# Patient Record
Sex: Male | Born: 1940 | Race: Black or African American | Hispanic: No | Marital: Single | Smoking: Former smoker
Health system: Southern US, Community
[De-identification: ages and names within clinical notes are randomized; demographics above are authoritative.]

## PROBLEM LIST (undated history)

## (undated) DIAGNOSIS — I10 Essential (primary) hypertension: Secondary | ICD-10-CM

## (undated) DIAGNOSIS — R110 Nausea: Secondary | ICD-10-CM

## (undated) DIAGNOSIS — C3490 Malignant neoplasm of unspecified part of unspecified bronchus or lung: Secondary | ICD-10-CM

## (undated) DIAGNOSIS — K5792 Diverticulitis of intestine, part unspecified, without perforation or abscess without bleeding: Secondary | ICD-10-CM

## (undated) DIAGNOSIS — R918 Other nonspecific abnormal finding of lung field: Secondary | ICD-10-CM

## (undated) DIAGNOSIS — K219 Gastro-esophageal reflux disease without esophagitis: Secondary | ICD-10-CM

## (undated) DIAGNOSIS — C7931 Secondary malignant neoplasm of brain: Secondary | ICD-10-CM

## (undated) DIAGNOSIS — Z860101 Personal history of adenomatous and serrated colon polyps: Secondary | ICD-10-CM

## (undated) DIAGNOSIS — C719 Malignant neoplasm of brain, unspecified: Secondary | ICD-10-CM

## (undated) DIAGNOSIS — Z66 Do not resuscitate: Secondary | ICD-10-CM

## (undated) DIAGNOSIS — R937 Abnormal findings on diagnostic imaging of other parts of musculoskeletal system: Principal | ICD-10-CM

## (undated) DIAGNOSIS — N400 Enlarged prostate without lower urinary tract symptoms: Secondary | ICD-10-CM

## (undated) DIAGNOSIS — Z86010 Personal history of colonic polyps: Secondary | ICD-10-CM

## (undated) DIAGNOSIS — H538 Other visual disturbances: Secondary | ICD-10-CM

## (undated) HISTORY — DX: Personal history of adenomatous and serrated colon polyps: Z86.0101

## (undated) HISTORY — DX: Secondary malignant neoplasm of brain: C79.31

## (undated) HISTORY — DX: Other nonspecific abnormal finding of lung field: R91.8

## (undated) HISTORY — DX: Nausea: R11.0

## (undated) HISTORY — DX: Essential (primary) hypertension: I10

## (undated) HISTORY — DX: Do not resuscitate: Z66

## (undated) HISTORY — DX: Abnormal findings on diagnostic imaging of other parts of musculoskeletal system: R93.7

## (undated) HISTORY — PX: OTHER SURGICAL HISTORY: SHX169

## (undated) HISTORY — DX: Benign prostatic hyperplasia without lower urinary tract symptoms: N40.0

## (undated) HISTORY — DX: Personal history of colonic polyps: Z86.010

## (undated) HISTORY — DX: Gastro-esophageal reflux disease without esophagitis: K21.9

## (undated) HISTORY — PX: TONSILECTOMY, ADENOIDECTOMY, BILATERAL MYRINGOTOMY AND TUBES: SHX2538

## (undated) SURGERY — BRONCHOSCOPY, WITH EBUS
Anesthesia: Monitor Anesthesia Care

---

## 1997-10-25 ENCOUNTER — Ambulatory Visit (HOSPITAL_COMMUNITY): Admission: RE | Admit: 1997-10-25 | Discharge: 1997-10-25 | Payer: Self-pay | Admitting: Urology

## 1997-10-25 ENCOUNTER — Encounter: Payer: Self-pay | Admitting: Urology

## 2000-12-15 ENCOUNTER — Encounter: Payer: Self-pay | Admitting: Internal Medicine

## 2000-12-15 ENCOUNTER — Emergency Department (HOSPITAL_COMMUNITY): Admission: EM | Admit: 2000-12-15 | Discharge: 2000-12-15 | Payer: Self-pay | Admitting: Internal Medicine

## 2001-08-03 ENCOUNTER — Encounter: Payer: Self-pay | Admitting: Internal Medicine

## 2001-08-03 ENCOUNTER — Ambulatory Visit (HOSPITAL_COMMUNITY): Admission: RE | Admit: 2001-08-03 | Discharge: 2001-08-03 | Payer: Self-pay | Admitting: Internal Medicine

## 2001-08-15 ENCOUNTER — Ambulatory Visit (HOSPITAL_COMMUNITY): Admission: RE | Admit: 2001-08-15 | Discharge: 2001-08-15 | Payer: Self-pay | Admitting: Internal Medicine

## 2001-11-29 ENCOUNTER — Emergency Department (HOSPITAL_COMMUNITY): Admission: EM | Admit: 2001-11-29 | Discharge: 2001-11-29 | Payer: Self-pay | Admitting: Emergency Medicine

## 2002-04-14 ENCOUNTER — Emergency Department (HOSPITAL_COMMUNITY): Admission: EM | Admit: 2002-04-14 | Discharge: 2002-04-15 | Payer: Self-pay | Admitting: *Deleted

## 2002-04-15 ENCOUNTER — Encounter: Payer: Self-pay | Admitting: *Deleted

## 2003-02-26 ENCOUNTER — Ambulatory Visit (HOSPITAL_COMMUNITY): Admission: RE | Admit: 2003-02-26 | Discharge: 2003-02-26 | Payer: Self-pay | Admitting: Internal Medicine

## 2003-08-22 ENCOUNTER — Ambulatory Visit (HOSPITAL_COMMUNITY): Admission: RE | Admit: 2003-08-22 | Discharge: 2003-08-22 | Payer: Self-pay | Admitting: Internal Medicine

## 2005-07-05 ENCOUNTER — Ambulatory Visit (HOSPITAL_COMMUNITY): Admission: RE | Admit: 2005-07-05 | Discharge: 2005-07-05 | Payer: Self-pay | Admitting: Internal Medicine

## 2007-02-23 HISTORY — PX: CHOLECYSTECTOMY: SHX55

## 2007-05-23 ENCOUNTER — Ambulatory Visit: Payer: Self-pay | Admitting: Internal Medicine

## 2007-05-26 ENCOUNTER — Ambulatory Visit (HOSPITAL_COMMUNITY): Admission: RE | Admit: 2007-05-26 | Discharge: 2007-05-26 | Payer: Self-pay | Admitting: Internal Medicine

## 2007-06-06 ENCOUNTER — Ambulatory Visit (HOSPITAL_COMMUNITY): Admission: RE | Admit: 2007-06-06 | Discharge: 2007-06-06 | Payer: Self-pay | Admitting: Internal Medicine

## 2007-06-06 ENCOUNTER — Encounter: Payer: Self-pay | Admitting: Internal Medicine

## 2007-06-06 ENCOUNTER — Ambulatory Visit: Payer: Self-pay | Admitting: Internal Medicine

## 2007-07-03 ENCOUNTER — Ambulatory Visit: Payer: Self-pay | Admitting: Internal Medicine

## 2007-07-06 ENCOUNTER — Ambulatory Visit (HOSPITAL_COMMUNITY): Admission: RE | Admit: 2007-07-06 | Discharge: 2007-07-06 | Payer: Self-pay | Admitting: Internal Medicine

## 2007-07-14 ENCOUNTER — Ambulatory Visit (HOSPITAL_COMMUNITY): Admission: RE | Admit: 2007-07-14 | Discharge: 2007-07-14 | Payer: Self-pay | Admitting: Internal Medicine

## 2007-09-05 ENCOUNTER — Ambulatory Visit: Payer: Self-pay | Admitting: Internal Medicine

## 2007-09-12 ENCOUNTER — Ambulatory Visit: Payer: Self-pay | Admitting: Gastroenterology

## 2007-09-12 ENCOUNTER — Encounter: Payer: Self-pay | Admitting: Gastroenterology

## 2007-09-12 ENCOUNTER — Ambulatory Visit (HOSPITAL_COMMUNITY): Admission: RE | Admit: 2007-09-12 | Discharge: 2007-09-12 | Payer: Self-pay | Admitting: Gastroenterology

## 2007-10-10 ENCOUNTER — Ambulatory Visit: Payer: Self-pay | Admitting: Internal Medicine

## 2007-10-16 ENCOUNTER — Emergency Department (HOSPITAL_COMMUNITY): Admission: EM | Admit: 2007-10-16 | Discharge: 2007-10-16 | Payer: Self-pay | Admitting: Emergency Medicine

## 2007-10-17 ENCOUNTER — Encounter (HOSPITAL_COMMUNITY): Admission: RE | Admit: 2007-10-17 | Discharge: 2007-11-16 | Payer: Self-pay | Admitting: Internal Medicine

## 2007-10-25 ENCOUNTER — Observation Stay (HOSPITAL_COMMUNITY): Admission: RE | Admit: 2007-10-25 | Discharge: 2007-10-26 | Payer: Self-pay | Admitting: General Surgery

## 2007-10-25 ENCOUNTER — Encounter (INDEPENDENT_AMBULATORY_CARE_PROVIDER_SITE_OTHER): Payer: Self-pay | Admitting: General Surgery

## 2008-10-31 DIAGNOSIS — K219 Gastro-esophageal reflux disease without esophagitis: Secondary | ICD-10-CM | POA: Insufficient documentation

## 2008-10-31 DIAGNOSIS — F411 Generalized anxiety disorder: Secondary | ICD-10-CM

## 2008-10-31 DIAGNOSIS — I10 Essential (primary) hypertension: Secondary | ICD-10-CM | POA: Insufficient documentation

## 2008-11-05 ENCOUNTER — Ambulatory Visit: Payer: Self-pay | Admitting: Internal Medicine

## 2008-11-06 DIAGNOSIS — Z860100 Personal history of colon polyps, unspecified: Secondary | ICD-10-CM | POA: Insufficient documentation

## 2008-11-29 ENCOUNTER — Encounter: Payer: Self-pay | Admitting: Internal Medicine

## 2008-11-29 ENCOUNTER — Ambulatory Visit (HOSPITAL_COMMUNITY): Admission: RE | Admit: 2008-11-29 | Discharge: 2008-11-29 | Payer: Self-pay | Admitting: Internal Medicine

## 2008-11-29 ENCOUNTER — Ambulatory Visit: Payer: Self-pay | Admitting: Internal Medicine

## 2008-12-03 ENCOUNTER — Encounter: Payer: Self-pay | Admitting: Internal Medicine

## 2009-07-22 ENCOUNTER — Emergency Department (HOSPITAL_COMMUNITY)
Admission: EM | Admit: 2009-07-22 | Discharge: 2009-07-22 | Payer: Self-pay | Source: Home / Self Care | Admitting: Emergency Medicine

## 2009-12-02 ENCOUNTER — Ambulatory Visit: Payer: Self-pay | Admitting: Internal Medicine

## 2009-12-31 ENCOUNTER — Ambulatory Visit (HOSPITAL_COMMUNITY): Admission: RE | Admit: 2009-12-31 | Discharge: 2009-12-31 | Payer: Self-pay | Admitting: Internal Medicine

## 2010-01-06 ENCOUNTER — Ambulatory Visit (HOSPITAL_COMMUNITY)
Admission: RE | Admit: 2010-01-06 | Discharge: 2010-01-06 | Payer: Self-pay | Source: Home / Self Care | Admitting: Internal Medicine

## 2010-01-12 ENCOUNTER — Encounter (HOSPITAL_COMMUNITY)
Admission: RE | Admit: 2010-01-12 | Discharge: 2010-02-11 | Payer: Self-pay | Source: Home / Self Care | Attending: Internal Medicine | Admitting: Internal Medicine

## 2010-02-06 ENCOUNTER — Ambulatory Visit (HOSPITAL_COMMUNITY): Payer: Self-pay | Admitting: Oncology

## 2010-02-06 ENCOUNTER — Encounter (HOSPITAL_COMMUNITY)
Admission: RE | Admit: 2010-02-06 | Discharge: 2010-03-08 | Payer: Self-pay | Source: Home / Self Care | Attending: Oncology | Admitting: Oncology

## 2010-03-24 NOTE — Assessment & Plan Note (Signed)
Summary: fu ov in one yr,GERD/hx of polyps/ss   Visit Type:  Follow-up Visit Primary Care Provider:  [name]  Chief Complaint:  F/U GERD.  History of Present Illness: 70 year old gentleman history of  multiple colonic polyps  and biliary dyskinesia status post cholecystectomy last year returns for one-year followup. He is doing very well. 2 years ago he underwent colonoscopy removal of greater than 10 adenomas - some were tubulovillous by Dr. Darrick Gill. Followup colonoscopy last year yielded only a couple of a adenomas and a hyperplastic polyp. He is due for routine surveillance in 2014. He is not having any reflux symptoms, abdominal pain or bowel symptoms at this time. He follows up with Dr. [name] regularly. Prior EGD demonstrated non-H. pyloric gastritis.    Current Medications (verified): 1)  Cardura 2 Mg Tabs (Doxazosin Mesylate) .... Take 1 Tablet By Mouth Once A Day 2)  Diovan Hct 160-12.5 Mg Tabs (Valsartan-Hydrochlorothiazide) .... Take 1 Tablet By Mouth Once A Day 3)  Celebrex 200 Mg Caps (Celecoxib) .... Take 1 Tablet By Mouth Once A Day  Allergies (verified): No Known Drug Allergies  Past History:  Past Medical History: Last updated: 10/31/2008 GERD Hypertension CHOLECYSTITIS BENIGN PROSTATIC HYPERTROPHY Anxiety  Past Surgical History: Last updated: 10/31/2008 Hemorrhoidectomy EGD COLONOSCOPY                          Cholecystectomy  Family History: Last updated: Nov 22, 2008 Father: Deceased age 64  unknown Mother: Deceased age 47  altzheimers Siblings: 2 brothers living               1 sister deceased   DM  Social History: Last updated: 2008-11-22 Marital Status:single Children: none Occupation: Radiographer, therapeutic Drinks beer on [employer] 1/2 pack cig daily Does alot of walking  Vital Signs:  Patient profile:   70 year old male Height:      74 inches Weight:      233 pounds BMI:     30.02 Temp:     97.6 degrees F oral Pulse rate:   72 / minute BP  sitting:   142 / 70  (left arm) Cuff size:   regular  Vitals Entered By: Jared Spring LPN (December 02, 2009 3:40 PM)  Physical Exam  General:  pleasant alert conversant gentleman appears at baseline Abdomen:  nondistended positive bowel sounds soft nontender without appreciable mass or organomegaly  Impression & Recommendations: Impression: 70 year old gentleman with a history of biliary dyskinesia status post cholecystectomy. History of multiple colonic adenomas with his last colonoscopy being October 2010 as described above.  Clinically, from a GI standpoint, he is doing extremely well.  Recommendations: Plan for surveillance colonoscopy 2014. No further GI evaluation warranted at this time.  Appended Document: Orders Update    Clinical Lists Changes  Orders: Added new Service order of Est. Patient Level III (47829) - Signed

## 2010-05-04 LAB — KAPPA/LAMBDA LIGHT CHAINS
Kappa free light chain: 1.11 mg/dL (ref 0.33–1.94)
Kappa, lambda light chain ratio: 0.73 (ref 0.26–1.65)
Lambda free light chains: 1.52 mg/dL (ref 0.57–2.63)

## 2010-05-04 LAB — IMMUNOFIXATION ELECTROPHORESIS: IgG (Immunoglobin G), Serum: 1070 mg/dL (ref 694–1618)

## 2010-05-04 LAB — IRON AND TIBC: UIBC: 194 ug/dL

## 2010-05-04 LAB — BETA 2 MICROGLOBULIN, SERUM: Beta-2 Microglobulin: 2 mg/L — ABNORMAL HIGH (ref 1.01–1.73)

## 2010-05-20 ENCOUNTER — Other Ambulatory Visit (HOSPITAL_COMMUNITY): Payer: BC Managed Care – PPO

## 2010-05-20 ENCOUNTER — Ambulatory Visit (HOSPITAL_COMMUNITY): Payer: BC Managed Care – PPO | Admitting: Oncology

## 2010-05-20 ENCOUNTER — Encounter (HOSPITAL_COMMUNITY): Payer: Medicare Other | Attending: Oncology

## 2010-05-20 DIAGNOSIS — N400 Enlarged prostate without lower urinary tract symptoms: Secondary | ICD-10-CM | POA: Insufficient documentation

## 2010-05-20 DIAGNOSIS — R948 Abnormal results of function studies of other organs and systems: Secondary | ICD-10-CM

## 2010-05-20 DIAGNOSIS — C9000 Multiple myeloma not having achieved remission: Secondary | ICD-10-CM | POA: Insufficient documentation

## 2010-07-07 NOTE — Assessment & Plan Note (Signed)
NAMEARTHURO, CANELO                 CHART#:  [id number]   DATE:  10/10/2007                       DOB:  [birth year]   CHIEF COMPLAINT:  No appetite, nausea, and weight loss.   PRIMARY GASTROENTEROLOGIST:  Jonathon Bellows, MD   SUBJECTIVE:  Mr. Donaghey is a pleasant 70 year old gentleman who we have  been seen now for 5 months for intermittent nausea, weight loss, and  constipation.  He underwent an EGD and colonoscopy on September 12, 2007, by  Dr. Kassie Mends, which revealed greater than 10 colon polyps removed, 1  polyp greater than 1 cm.  The majority of the polyps were between 3 and  6 mm.  One was again 2 cm sessile/serpiginous polyp at the hepatic  flexure.  This one was tattooed with ink spot.  He had sigmoid  diverticula and moderate internal hemorrhoids.  He had patchy erythema  along the angularis and the antrum of the stomach which were biopsied,  but negative for H. pylori.  No evidence of duodenal polyp remaining  from his prior EGD.  He has had multiple studies done which really have  not fully explained his symptoms.  He has had a normal small bowel  follow through.  He had abdominal ultrasound which did not reveal any  biliary problems.  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  He has had  normal iron and ferritin, lipase, amylase, and LFTs.  He has chronic  mild microcytic anemia with hemoglobin 12.4 and MCV of 73.1.  He  presents today.  He has lost another 6 pounds for a total of 22 pounds  since March of this year.  He states he wakes up in the morning and he  is nauseated.  Some days, he has no nausea and eats very well on those  days.  Sometimes when he eats, the nausea is worse.  He complains of a  dry-bitter taste in his mouth.  He has been trying to eat Ensure  regularly.  He is having a bowel movement every couple of days,  but  often the stool is hard.  He saw Dr. Ouida Sills recently and was started on  Celexa for nerves.  He states his hands have been trembling a lot.  He  has been on the Celexa now for a week.  He again appears to be confused  about which medications he supposed to be on.  Today, he is not on any  PPI for his stomach.  He continues to take the Librax, which I asked him  to drop back because of constipation.  He can read a little bit, but  does not read well.   CURRENT MEDICATIONS:  1. Benicar 40 mg daily.  2. Flomax 0.4 mg daily.  3. Librax t.i.d.  4. Prochlorperazine 10 mg every 6 hours as needed.  5. Celexa 20 mg daily.   ALLERGIES:  No known drug allergies.   PHYSICAL EXAMINATION:  VITAL SIGNS:  Weight 194, down 6 more pounds (a  total of 22 pounds).  Temp 98.5, blood pressure 108/70, and pulse 72.  GENERAL:  A pleasant, well-nourished, well-developed black gentleman in  no acute distress.  SKIN:  Warm  and dry.  No jaundice.  HEENT:  Sclerae nonicteric.  ABDOMEN:  Positive bowel sounds, soft, nontender, and nondistended.  No  organomegaly or masses.  No rebound or guarding.  No abdominal bruits or  hernias.  LOWER EXTREMITIES:  No edema.   IMPRESSION:  Mr. Thorstenson is a 70 year old gentleman with chronic weight  loss, early morning nausea when he wakes up, some postprandial nausea  component, but really no abdominal pain.  He has chronic constipation  which is still not well controlled.  He has chronic gastroesophageal  reflux disease and is no longer on PPI therapy.   PLAN:  1. I have asked him to resume his omeprazole 20 mg daily.  I sorted      through his medications and separated the ones that he should not      be on.  2. Samples of MiraLax provided and he was advised to take one packet      daily and to hold for diarrhea, 10 day samples provided.  3. We will check a TSH level.  4. HIDA scan.  5. Further recommendations to follow.       Tana Coast, P.A.   Electronically Signed     Kassie Mends, M.D.  Electronically Signed    LL/MEDQ  D:  10/10/2007  T:  10/11/2007  Job:  [id number]   cc:   Kingsley Callander. Ouida Sills, MD

## 2010-07-07 NOTE — H&P (Signed)
Jared Gill, Jared Gill                ACCOUNT NO.:  [id number]   MEDICAL RECORD NO.:  [id number]          PATIENT TYPE:  AMB   LOCATION:  DAY                           FACILITY:  [facility]   PHYSICIAN:  R. Roetta Sessions, M.D. DATE OF BIRTH:  1940-03-30   DATE OF ADMISSION:  DATE OF DISCHARGE:  LH                              HISTORY & PHYSICAL   CHIEF COMPLAINT:  Nausea and constipation.  Schedule EGD and  colonoscopy.   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  We initially saw him in March of  this year.  He initially had a CT of the abdomen and pelvis which  revealed mild prostatic enlargement, sigmoid diverticulosis, dilated  right inguinal canal without bowel herniation, colon interposition  between liver and diaphragm.  EGD with Dr. Virginia Rochester revealed small hiatal  hernia, antral and bulbar erosions with scarring in the bulb.  An  adenomatous-appearing nodule in the second portion of the duodenum.  He  had duodenal nodule biopsy, and indeed was partially removed.  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  Biopsies from the  gastric lining showed mild chronic gastritis without H. Pylori.  He is  known to have chronic mild anemia with microcytic indices.  His iron was  59, ferritin 264.  He has also an abdominal ultrasound to exclude  gallbladder disease, no evidence of gallstones present.  He had a small-  bowel follow-through recently which was normal.   He states that he had been doing fairly well.  He notes that he does not  eat anywhere near as much as he is used to.  He used to be a Programmer, systems.  Now, he just eats healthy food.  He has lost a total of 16  pounds since we met him 4 months ago.  He still having some episodes of  nausea.  He has been having more  constipation recently.  He states he  saw Dr. Ouida Sills last month and was put on Librax taking 1 tablet 3 times a  day.  He says it seems to help his appetite.  He has noted more problems  with bowel movements.  He tries not to strain because if does he sees  bright red blood.  He is having bowel movement only every 3-4 days.  He  tried some Metamucil, but really has not noticed any benefit yet.  He  used to have bowel movements 2 times daily.  He occasionally has some  left upper quadrant abdominal pain.   CURRENT MEDICATIONS:  1. Benicar 40 mg daily.  2. Flomax 0.4 mg daily.  3. Omeprazole 20 mg daily.  4. Lorazepam 1 mg at nightly.  5. Librax t.i.d.  6. Prochlorperazine 10 mg every 6 hours as needed for nausea.  7. Zegerid 40 mg daily.  8. Lamisil 250 mg daily.   ALLERGIES:  No  known drug allergies.   PAST MEDICAL HISTORY:  1. Hypertension.  2. BPH.  3. GERD.   PAST SURGICAL HISTORY:  1. Circumcision.  2. Hemorrhoidectomy.  3. EGD as outlined above.  4. Last colonoscopy was in 2000.  He had friable anal canal      hemorrhoid, diminutive polyps in the rectum and in the cecum      biopsied and removed.  Path revealed inflammatory polyps.  He also      had left-sided diverticular disease.   FAMILY HISTORY:  Mother deceased at age 43 with Alzheimer's disease.  Father deceased at age 8 possible hemorrhage.  Brothers and sisters  with diabetes mellitus.  No family history of colorectal cancer.   SOCIAL HISTORY:  Single.  He is employed in [employer], part  time in the [place], prior to that he worked in [employer].  He smokes  half pack cigarettes daily.  He has never been a daily drinker, but  drank on the weekends in the past.   REVIEW OF SYSTEMS:  GI:  See HPI.  CONSTITUTIONAL:  See HPI.  CARDIOPULMONARY:  Denies chest pain, shortness of breath, palpitations,  or cough.   PHYSICAL EXAMINATION:  VITAL SIGNS:  Weight 200, down 9 more pounds  since May and a  total of 16 pounds since March.  Height 6 feet 2 inches,  temp 98.1, blood pressure 118/80, and pulse 60.  GENERAL:  Pleasant, mildly obese white male in no acute distress.  SKIN:  Warm and dry.  No jaundice.  HEENT:  Sclerae nonicteric. Oropharyngeal mucosa moist and pink.  No  lesions, erythema, or exudates.  No lymphadenopathy or thyromegaly.  CHEST:  Lungs are clear to auscultation.  CARDIAC:  Regular rate and rhythm.  Normal S1 and S2.  No murmurs, rubs,  or gallops.  ABDOMEN:  Positive bowel sounds.  Abdomen is soft, mildly obese,  nontender.  No organomegaly or masses.  No rebound or guarding.  No  abdominal bruits or hernia.  LOWER EXTREMITIES:  No edema.   IMPRESSION:  [name] is a 70 year old gentleman with history of  intermittent nausea, weight loss, abdominal pain, and now constipation.  He had an abnormal esophagogastroduodenoscopy as outlined above, and he  needs to have a followup esophagogastroduodenoscopy to make sure there  is no remaining duodenal lesion given that that was a tubulovillous  adenoma.  He is also due for a followup colonoscopy and does have some  intermittent bright red per rectum and now change in bowel movement.  Change of bowel movements may be related to recent initiation of Librax.  His weight loss is somewhat worrisome.  He does admit to dietary changes  with more healthy diet.  He has not consumed as much.  He had a fairly  extensive evaluation up to this point.   PLAN:  1. Esophagogastroduodenoscopy and colonoscopy is planned with Dr. Virginia Rochester      in the near future.  2. I have asked him to decrease his Librax to twice daily as it maybe      causing his constipation.  3. I will add Metamucil or fenofibrate or whichever samples we have to      take 1 daily.  4. I have asked him not to take the Zegerid and to continue his      omeprazole 20 mg daily.  He was inadvertently taking both of these      medications.  5. Further recommendations to  follow.  Tana Coast, P.AJonathon Bellows, M.D.  Electronically Signed    LL/MEDQ  D:  09/05/2007  T:  09/06/2007  Job:  [id number]   cc:   Kingsley Callander. Ouida Sills, MD  Fax: [phone]

## 2010-07-07 NOTE — H&P (Signed)
Jared Gill, Jared Gill                ACCOUNT NO.:  [id number]   MEDICAL RECORD NO.:  [id number]          PATIENT TYPE:  AMB   LOCATION:  DAY                           FACILITY:  [facility]   PHYSICIAN:  Dalia Heading, M.D.  DATE OF BIRTH:  08/19/1940   DATE OF ADMISSION:  DATE OF DISCHARGE:  LH                              HISTORY & PHYSICAL   CHIEF COMPLAINT:  Chronic cholecystitis.   HISTORY OF PRESENT ILLNESS:  The patient is a 70 year old black male who  is referred for evaluation and treatment of biliary colic secondary to  chronic cholecystitis.  He has been having intermittent right upper  quadrant abdominal pain with radiation to the flank, nausea, and  indigestion for many months.  It is made worse with fatty foods.  No  fever, chills, or jaundice have been noted.   PAST MEDICAL HISTORY:  Includes benign prostatic hypertrophy.   PAST SURGICAL HISTORY:  Unremarkable.   CURRENT MEDICATIONS:  Dicyclomine, Benicar, Flomax, omeprazole,  lorazepam, Phenergan p.r.n., Zegerid, Lamisil, and citalopram.   ALLERGIES:  No known drug allergies.   REVIEW OF SYSTEMS:  The patient denies drinking or smoking.  Denies any  other cardiopulmonary difficulties or bleeding disorders.   PHYSICAL EXAMINATION:  GENERAL:  The patient is a well-developed and  well-nourished black male in no acute distress.  HEENT:  Reveals no scleral icterus.  LUNGS:  Clear to auscultation with equal breath sounds bilaterally.  HEART:  Reveals a regular rate and rhythm without S3, S4, or murmurs.  ABDOMEN:  Soft, nontender, and nondistended.  No hepatosplenomegaly,  masses, or hernias are identified.   Ultrasound of the gallbladder is negative.  HIDA scan reveals chronic  cholecystitis with a low gallbladder ejection fraction and reproducible  symptoms.   IMPRESSION:  Chronic cholecystitis.   PLAN:  The patient is scheduled for laparoscopic cholecystectomy on  October 25, 2007.  The risks and benefits of the  procedure including  bleeding, infection, hepatobiliary injury, and the possibility of an  open procedure were fully explained to the patient, who gave informed  consent.      Dalia Heading, M.D.  Electronically Signed     MAJ/MEDQ  D:  10/19/2007  T:  10/20/2007  Job:  [id number]   cc:   R. Roetta Sessions, M.D.  [address]  [place]  [place] [address]   Kingsley Callander. Ouida Sills, MD  Fax: [phone]

## 2010-07-07 NOTE — Op Note (Signed)
NAMEEDWARDO, WOJNAROWSKI                ACCOUNT NO.:  [id number]   MEDICAL RECORD NO.:  [id number]          PATIENT TYPE:  AMB   LOCATION:  DAY                           FACILITY:  [facility]   PHYSICIAN:  Kassie Mends, M.D.      DATE OF BIRTH:  10/23/1940   DATE OF PROCEDURE:  09/12/2007  DATE OF DISCHARGE:                               OPERATIVE REPORT   REFERRING PHYSICIAN:  Kingsley Callander. Ouida Sills, MD.   PRIMARY GASTROENTEROLOGIST:  Jonathon Bellows, MD   PROCEDURE:  1. Colonoscopy with cold forceps and snare cautery polypectomy with      tattooing of a large hepatic flexure polyp.  2. Esophagogastroduodenoscopy with cold forceps biopsy.   INDICATION FOR EXAM:  Mr. Kinn is a 70 year old male who presented as an  outpatient with nausea and constipation.  He reports being treated for  H. pylori gastritis.  He was noted to have a microcytic anemia.  He also  reports losing weight.  He is complaining of left upper quadrant pain  and constipation.  He takes reportedly omeprazole and Zegerid daily.  He  denies any alcohol use.   FINDINGS:  1. Multiple colon polyps were (greater than 10) removed via cold      forceps and snare cautery.  The majority of the polyps were between      3 mm and 6 mm.  He had a 2-cm sessile/serpiginous polyps seen at      the hepatic flexure, which was removed in a piecemeal via snare      cautery and cold forceps.  The lesion was tattooed with 2 mL of ink      spot.  2. Frequent descending colon and sigmoid colon diverticula.  Moderate      internal hemorrhoids.  Otherwise, no masses, inflammatory changes,      or AVMs seen.  3. Normal esophagus without evidence of Barrett's mass, thrombosis,      ulceration, or strictures.  4. Patchy erythema along the angularis and in the body and antrum of      the stomach.  Biopsy obtained via cold forceps to evaluate for H.      pylori gastritis.  5. Normal duodenal bulb and second portion of the duodenum.  No      duodenal polyps  appreciated.   DIAGNOSES:  1. Greater than 10 colon polyps removed, and one polyp greater than 1      cm. The likely source for Mr. Verbrugge microcytic anemia are multiple      colon polyps  2. Moderate diverticulosis. Otherwise, no masses, inflammatory, or      AVMs.  3. Moderate internal hemorrhoids.  4. Mild gastritis.   RECOMMENDATIONS:  1. Will call Mr. Treese with the results of his biopsies.  If he has      multiple adenomatous polyps then he should likely be tested for      attenuated familial adenomatous polyposis.  If he has evidence of      invasive carcinoma then he will need a right hemicolectomy.  2. He should follow high-fiber diet.  He is given a handout on high-      fiber diet, polyps, diverticulosis, hemorrhoids, and gastritis.  3. No aspirin, NSAIDs, or anticoagulation for 7 days.  4. He should continue his PPI therapy.  He should continue his      Metamucil or fiber supplementation daily.  5. Follow up appointment in 6 weeks with Roetta Sessions, or Tana Coast regarding his constipation.   MEDICATIONS:  1. Demerol 100 mg IV.  2. Versed 10 mg IV.   PROCEDURE TECHNIQUE:  Physical exam was performed and informed consent  was obtained from the patient after explaining the benefits, risks, and  alternatives to the procedure.  The patient was connected to the monitor  and placed in the left lateral position.  Continuous oxygen was provided  by nasal cannula and  IV medicine administered through an indwelling  cannula.  After administration of sedation and rectal exam, the  patient's  intubated and the scope was advanced under direct  visualization to the cecum.  The scope was removed slowly by carefully  examining the color, texture, anatomy, and integrity of the mucosa on  the way out.   After colonoscopy, the patient's esophagus was intubated with a  diagnostic gastroscope.  The scope was advanced under direct  visualization to the second portion of the  duodenum.  The scope was  removed slowly by carefully examining the color, texture, anatomy, and  integrity of the mucosa on the way out.  The patient was recovered in  endoscopy and discharged home in satisfactory condition.   PATH:  Multiple adenomas. Large tubulovillous adenoma. TCS in 3 years.  High fiber diet.      Kassie Mends, M.D.  Electronically Signed     SM/MEDQ  D:  09/12/2007  T:  09/13/2007  Job:  [id number]   cc:   Kingsley Callander. Ouida Sills, MD  Fax: [phone]

## 2010-07-07 NOTE — Op Note (Signed)
Jared Gill, Jared Gill                ACCOUNT NO.:  [id number]   MEDICAL RECORD NO.:  [id number]          PATIENT TYPE:  AMB   LOCATION:  DAY                           FACILITY:  [facility]   PHYSICIAN:  Jared Gill, M.D. DATE OF BIRTH:  06/13/1940   DATE OF PROCEDURE:  06/06/2007  DATE OF DISCHARGE:                               OPERATIVE REPORT   INDICATIONS FOR PROCEDURE:  A 67-year gentleman with several week  history of vague upper abdominal pain associated with nausea and weight  loss.  Gallbladder remains in situ.  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  We got him in the office and set him up for a  diagnostic EGD today.  He really denies any abdominal pain and currently  he says his anorexia and abdominal pain have improved.  He tells me he  has been eating regular food without any difficulty.  He was started on  some Zegerid 40 mg orally daily, through the office on May 23, 2007.  He did undergo a CT of the abdomen and pelvis on May 26, 2007, which  demonstrated tiny right renal cyst and sigmoid diverticulosis.  No  evidence of diverticulitis or other inflammatory hernioplastic process.  Prostate minimally enlarged.  Labs from May 25, 2007, demonstrated  white count of 5.6, H&H 12.4 and 37.7, MCV 73.1, and platelet count  251,000.  Chem 20 looked good except for slightly elevated glucose at  132, amylase was 34, and lipase less than 10.  Iron studies have been  drawn for further evaluation of microcytic indices.  They are pending at  time of this dictation.  EGD is now being done.  This approach was  discussed with the patient at length.  Potential risks, benefits, and  alternatives have been reviewed and  questions were answered.  He is  agreeable.  Please see the documentation in the medical record.  [name] Kitchen   PROCEDURE NOTE:  O2 saturation, blood pressure, and pulse of the patient  monitored throughout the entire procedure.  Conscious sedation, Versed 4  mg IV and Demerol 100 g IV in divided doses.  Cetacaine spray for  topical pharyngeal anesthesia.   INSTRUMENT:  Pentax video chip system.   Examination of the tubular esophagus revealed no mucosal abnormalities.  EG junction easily traversed and into his stomach.  Gastric cavity was  emptied, and insufflated well with air.  Thorough examination of the  gastric mucosa including retroflexed view of the proximal stomach  esophagogastric junction demonstrated only a small hiatal hernia and  multiple antral erosions.  There was no infiltrating process or frank  ulcer seen.  Pylorus was patent and easily traversed.  Examination of  the bulb demonstrated continuation of the erosions and some scarring in  the bulb with erosions seen similar to that noted in the antrum.  In the  second portion of the duodenum, there was a 6-mm adenomatous-appearing  lesion on a fold.  Please see photos.  This lesion  was biopsied multiple  times and essentially denuded.  Most of it was removed with multiple  pieces, submitted to the pathologist.  Subsequently, biopsies of the  antrum were taken for histologic study.  The patient tolerated the  procedure well and was reacted in Endoscopy.   IMPRESSION:  Normal esophagus, small hiatal hernia, antral and bulbar  erosions with scarring in the bulb as described above status post  gastric (antral) biopsy and adenomatous-appearing nodule in the second  portion of duodenum on a fold, biopsied/partially denuded.   RECOMMENDATIONS:  We will follow a path to assess for H. pylori.  Followup on the duodenal nodule biopsied/denuded or partially removed.  Clinically the patient is doing much better.  I suppose gallbladder  disease  would remain in the differential at this time.  He has microcytic  indices.  It has been almost 10 years since he last had a colonoscopy.  We will follow up on iron  studies.  Suspect we may be moving towards  getting a colonoscopy out of the way in the next several weeks.  Further  recommendations to follow.      Jared Gill, M.D.  Electronically Signed     RMR/MEDQ  D:  06/06/2007  T:  06/07/2007  Job:  [id number]   cc:   Jared Gill. Jared Sills, MD  Fax: [phone]

## 2010-07-07 NOTE — Op Note (Signed)
NAMERIELY, BASKETT                ACCOUNT NO.:  [id number]   MEDICAL RECORD NO.:  [id number]          PATIENT TYPE:  OBV   LOCATION:  A309                          FACILITY:  [facility]   PHYSICIAN:  Dalia Heading, M.D.  DATE OF BIRTH:  1940/04/28   DATE OF PROCEDURE:  10/25/2007  DATE OF DISCHARGE:                               OPERATIVE REPORT   PREOPERATIVE DIAGNOSIS:  Chronic cholecystitis.   POSTOPERATIVE DIAGNOSIS:  Chronic cholecystitis.   PROCEDURE:  Laparoscopic cholecystectomy.   SURGEON:  Dalia Heading, MD   ANESTHESIA:  General endotracheal.   INDICATIONS:  The patient is a 70 year old black male who presents with  biliary colic secondary to chronic cholecystitis.  The risks and  benefits of the procedure including bleeding, infection, hepatobiliary,  the possibility of an open procedure were fully explained to the  patient, gave an informed consent.   PROCEDURE NOTE:  The patient was placed in the supine position.  After  induction of general endotracheal anesthesia, the abdomen was prepped  and draped in the usual sterile technique with Betadine.  Surgical site  confirmation was performed.   A supraumbilical incision was made down to the fascia.  A Veress needle  was introduced into the abdominal cavity and confirmation of placement  was done using the saline drop test.  The abdomen was then insufflated  to 16 mmHg pressure.  An 11-mm trocar was introduced into the abdominal  cavity under direct visualization without difficulty.  The patient was  placed in reversed Trendelenburg position.  An additional 11-mm trocar  was placed in the epigastric region and 5 mm trocars were placed in the  right upper quadrant and right flank regions.  Liver was inspected and  noted to be within normal limits.  The gallbladder was noted have a  significant amount of adhesions of omentum and duodenum around it.  These were freed away sharply and using Bovie electrocautery.  There  was  a small serosal cautery artifact on the duodenum at the pylorus.  Using  an Endo-Stitch, this was reinforced with surrounding adipose tissue.  The gallbladder was then retracted superior laterally.  The dissection  was begun around the infundibulum of the gallbladder.  The cystic duct  was first identified.  Its juncture to the infundibulum fully  identified.  EndoClips were placed proximally and distally on the cystic  duct and cystic duct was divided.  This was likewise done in the cystic  artery.  The gallbladder was then freed away from the gallbladder fossa  using Bovie electrocautery.  There was some bile spillage during the  removal, though this was removed using suction.  The gallbladder was  then removed from the abdominal cavity using an EndoCatch bag without  difficulty.  The gallbladder fossa was inspected.  No abnormal bleeding  or bile leakage was noted.  Surgicel was placed in the gallbladder  fossa.  All fluid narrowed, then evacuated from the abdominal cavity  prior to removal of the trocars.   All wounds were irrigated normal saline.  All wounds were  injected with  0.5  cm Sensorcaine.  The supraumbilical fascia was reapproximated using  an 0 Vicryl interrupted suture.  All skin incisions were closed using  staples.  Betadine ointment and dry sterile dressings were applied.   All tape and needle counts were correct at the end of the procedure.  The patient was extubated in the operating room and went back to  recovery room awake, in stable condition.   COMPLICATIONS:  None.   SPECIMEN:  Gallbladder.   BLOOD LOSS:  Minimal.      Dalia Heading, M.D.  Electronically Signed     MAJ/MEDQ  D:  10/25/2007  T:  10/25/2007  Job:  [id number]   cc:   Charlaine Dalton. Sherene Sires, MD, FCCP  [address]  [place] [address]   Kingsley Callander. Ouida Sills, MD  Fax: [phone]   Chart

## 2010-07-07 NOTE — Assessment & Plan Note (Signed)
NAMERAMIEL, FORTI                 CHART#:  [id number]   DATE:                                   DOB:  Jun 17, 1940   DATE OF CONSULTATION:  May 23, 2007.   CHIEF COMPLAINT:  Abdominal pain and nausea.   PHYSICIAN COSIGNING NOTE:  Jonathon Bellows, MD.   HISTORY OF PRESENT ILLNESS:  Mr. 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 Jena Gauss at the hospital with complaints of abdominal  pain and nausea.  We made him an appointment to be seen urgently today.  He states over the last two to three weeks, he has been having ongoing  symptoms of upper abdominal pain, associated nausea, and lack of  appetite.  He says he has lost some weight, but it is really not clear  how much.  He has had no vomiting, although he feels like he needs to.  Prior to these symptoms, he was having pretty regular bowel movements.  Now, he is having infrequent stools a couple of times a week.  The  stools are dark brown.  He denies any melena or bright red blood per  rectum.  He complains of severe epigastric burning.  He has pain in both  upper quadrants.  He is not able to eat.  He is not sure if it is worse  with meals.  He has been consuming a very bland diet lately.  Denies any  dysphagia or odynophagia.  He has tried no over-the-counter medications.  He is only on his prescription meds, which are included below.  He was  given Prochlorperazine for nausea but ran out.  He is on Dicyclomine,  which was given by Dr. Ouida Sills.  It does not seem to be helping.  He says  he has started to feel weak.   CURRENT MEDICATIONS:  1. Dicyclomine 10 mg t.i.d.  2. Benicar 40 mg daily.  3. Flomax 0.4 mg daily.   ALLERGIES:  No known drug allergies.   PAST MEDICAL HISTORY:  1. Hypertension.  2. BPH.  3. GERD.   PAST SURGICAL HISTORY:  1. Circumcision.  2. Hemorrhoidectomy.  3. He had an EGD in 2003, which was normal, and a  colonoscopy in 2000,      which revealed friable anal canal hemorrhoids, diminutive polyp in      the rectum and the cecum biopsied and removed, path revealed      inflammatory polyps, and he also had prominent left-sided      diverticular disease.   FAMILY HISTORY:  Mother is deceased at age 74 with Alzheimer's disease,  father decreased at age 83 of a possible hemorrhage, brothers and  sisters with diabetes mellitus, no family history of colorectal cancer.   SOCIAL HISTORY:  Single, he is employed at [employer] part-time  in [employer], prior to that he worked in [employer], he smokes half-a-  pack of cigarettes daily, he has never been a daily drinker but drank on  the weekends in the past.  He says his last alcoholic beverage was about  a month ago.   REVIEW OF SYSTEMS:  See HPI for GI and constitutional.  Cardiopulmonary:  Denies chest pain, shortness of breath, palpitations,  or cough.   PHYSICAL EXAMINATION:  VITAL SIGNS:  Weight 216, height 6 foot 2, temp  98.2, blood pressure 122/70, pulse 64.  GENERAL:  A pleasant, obese, black male in no acute distress.  SKIN:  Warm and dry, no jaundice.  HEENT:  Sclerae are nonicteric, oropharyngeal mucosa moist and pink, no  lesions, erythema, or exudate.  NECK:  No lymphadenopathy, thyromegaly.  CHEST:  Lungs are clear to auscultation.  CARDIAC:  Regular rate and rhythm, normal S1 S2, no murmurs, rubs, or  gallops.  ABDOMEN:  Positive bowel sounds, abdomen is obese and soft, he has some  fullness in the epigastrium with some tenderness, he also has tenderness  in the left lower quadrant as well.  No rebound or guarding, no  abdominal bruits or hernias, no hepatosplenomegaly notable.  LOWER EXTREMITIES:  No edema.   IMPRESSION:  Mr. Tomeo is a 70 year old gentleman with several week  history of upper abdominal pain associated with nausea and a reported  weight loss.  The gallbladder remains in situ.  Cannot exclude the   possibility of gallbladder disease, although differential also includes  peptic ulcer disease, nonulcerative dyspepsia, gastroesophageal reflux  disease, and less likely pancreatitis.  He had some fullness, but no  discrete mass in the upper abdomen.   PLAN:  1. A CT of the abdomen and pelvis with IV normal contrast.  2. CMET, amylase, lipase, CBC.  3. Zegerid 40 mg daily, #20 samples given.  4. Phenergan 25 mg, a half to one tablet every four to six hours as      needed for nausea and vomiting, #20, zero refills.  5. Out of work note given for today.  6. If CT is unremarkable, will proceed with EGD as the next step.  7. His last colonoscopy was nine years ago and he is approaching the      age for repeat screening.       Tana Coast, P.A.  Electronically Signed     R. Roetta Sessions, M.D.  Electronically Signed    LL/MEDQ  D:  05/23/2007  T:  05/23/2007  Job:  [id number]   cc:   Kingsley Callander. Ouida Sills, MD

## 2010-07-07 NOTE — Assessment & Plan Note (Signed)
Jared Gill, Jared Gill                 CHART#:  [id number]   DATE:  07/03/2007                       DOB:  1940/05/23   CHIEF COMPLAINT:  Nausea.   SUBJECTIVE:  Ms. Jared Gill is here for a follow-up visit.  He has a history  of approximately 2 months of abdominal discomfort and nausea.  We  initially saw him in the office on May 23, 2007.  He was complaining  of a reported weight loss and upper abdominal pain associated with  nausea.  Workup thus far has included an EGD which revealed a small  hiatal hernia and bulbar erosions with scarring in the bulb as well as  an adenomatous-appearing nodule in the second portion of the duodenum,  multiple antral erosions.  Gastric biopsies revealed mild chronic  gastritis, negative for H. pylori.  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  He will need a follow-up EGD in 3 months to make  sure there no remaining lesion.  Labs revealed a mild microcytic anemia  with normal iron and ferritin.  His lipase was normal.  Amylase normal.  LFTs normal.  Glucose was slightly high at 132 and was fasting.  Hemoglobin was 12.4, hematocrit 37.7, MCV 73.1, platelets 251,000, WBC  5.6.  Notably, back to 1994 he had labs which revealed hemoglobin of 13,  MCV was 73.5 then.  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   The patient presents today.  He continues to have intermittent nausea.  He states he is usually nauseated first thing in the morning when he  starts to smell food.  He has been eating a very bland diet.  He has  been eating a lot of chicken noodle soup, oatmeal.  He has lost about 7  pounds since we last saw him in March.  He has not had as  much abdominal  pain except for some left upper quadrant discomfort.  He has chronic  constipation and takes suppositories as needed.  Generally he has a  bowel movement every 2-3 days.  Denies any blood in stool or melena.  He  denies any heartburn.  He is on Prilosec.  He has had no vomiting.   CURRENT MEDICATIONS:  1. Benicar 40 mg daily.  2. Flomax 0.4 mg daily.  3. Omeprazole 20 mg daily.  4. Lorazepam 1 mg nightly.  5. Phenergan 25 mg p.r.n.   ALLERGIES:  No known drug allergies.   PHYSICAL EXAM:  Weight 209, down 7 pounds.  Temperature 97.7, blood  pressure 142/82, pulse 72.  GENERAL:  Pleasant, well-nourished, well-developed black gentleman in no  acute distress.  SKIN:  Warm and dry.  No jaundice.  HEENT:  Sclerae are nonicteric.  Oropharyngeal mucosa moist and pink.  No lesions, erythema or exudate.  No lymphadenopathy, thyromegaly.  ABDOMEN:  Positive bowel sounds.  Abdomen is soft, nondistended.  He has  mild epigastric left upper quadrant tenderness to deep palpation.  No  rebound or guarding.  No abdominal bruits or hernias.  LOWER EXTREMITIES:  No  edema.   IMPRESSION:  [name] is a 70 year old African American gentleman who  has persistent intermittent nausea with less abdominal pain component at  this point.  He has had progressive weight loss of 7 pounds since we  last saw him.  He has made significant dietary changes due to his  nausea.  He does have days where he is able to eat whatever he wants;  however, generally he has been the limiting his diet to very bland, soft  to liquidy foods because of his nausea.  His gallbladder remains in  situ.  We will go ahead and exclude obvious gallbladder disease.  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  We will go and pursue  colonoscopy at that time it has been over 9 years since his last  colonoscopy and he has persistent  microcytic anemia.   PLAN:  1. Abdominal ultrasound.  2. EGD and colonoscopy in July 2009.  3. He will take his Prilosec 30 minutes before his evening meal.  4. Continue Phenergan p.r.n.  5. I  have asked the patient again to follow-up with Dr. Carylon Perches      regarding elevated glucose.       Tana Coast, P.A.  Electronically Signed     R. Roetta Sessions, M.D.  Electronically Signed    LL/MEDQ  D:  07/03/2007  T:  07/03/2007  Job:  [id number]   cc:   Kingsley Callander. Ouida Sills, MD

## 2010-07-10 NOTE — Op Note (Signed)
[facility]  Patient:    Jared Gill, Jared Gill Visit Number: [id number] MRN: [id number]          Service Type: END Location: DAY Attending Physician:  Malissa Hippo Dictated by:   Roetta Sessions, M.D. Proc. Date: 08/15/01 Admit Date:  08/15/2001   CC:         Carylon Perches, M.D.   Operative Report  PROCEDURE:  Diagnostic esophagogastroduodenoscopy.  GASTROENTEROLOGIST:  Roetta Sessions, M.D.  INDICATIONS:  The patient is a 70 year old gentleman with vague upper abdominal discomfort, borderline microcytic anemia.  He is followed primarily by Dr. Ouida Sills.  He today tells me that he is markedly improved on Nexium 40 mg orally daily which was started by Dr. Ouida Sills just prior to my seeing him.  Prior UA and CT of the abdomen were negative.  Colonoscopy for hematochezia back in March 2003 demonstrated hemorrhoids and polyp in the rectum and cecum removed with cold biopsy forceps.  They turned out to be inflammatory.  EGD is now being done to further evaluate his symptoms.  This approach has been discussed with [name] at length previously and again today at the bedside. Potential risks, benefits, and alternatives have been reviewed and questions answered.  He is agreeable.  Please see my dictated H&P for more information. I feel he is low risk for conscious sedation.  PROCEDURE NOTE:  O2 saturation, blood pressure, and pulse for this patient were monitored throughout the entire procedure.  CONSCIOUS SEDATION:  Versed 3 mg IV, Demerol 50 mg IV in divided doses.  INSTRUMENT:  Olympus video chip gastroscope.  FINDINGS:   Examination of the tubular esophagus revealed no mucosal abnormalities.  The EG junction was easily traversed.  STOMACH:  Gastric cavity was emptied and insufflated well with air.  Thorough examination of gastric mucosa including retroflexed view of the proximal stomach and esophagogastric junction demonstrated no abnormalities.  Pylorus was  patent and easily traversed.  DUODENUM: Bulb and second portion appeared normal.  THERAPEUTIC/DIAGNOSTIC MANEUVERS PERFORMED:  None.  The patient tolerated the procedure well and was reacted in endoscopy.  IMPRESSION: 1. Normal esophagus. 2. Normal stomach. 3. Normal first and second portions of the duodenum.  There is no endoscopic explanation of the patients symptoms.  He is markedly improved on acid suppression therapy.  An element of gastroesophageal reflux disease and/or nonulcerative dyspepsia may have contributed to the patients recent symptoms.  Of note, he was also on Celebrex earlier in the year, but his medication was stopped.  RECOMMENDATIONS:  Would continue Nexium 40 mg orally daily.  Will have him come back to see me in six weeks so we can see how things are going.  Further recommendations to follow.  ADDENDUM:  From August 10, 2001, through my office, CBC showed white count 5.9, hemoglobin 12.6, hematocrit 39.0, MCV 76, platelet count 227. Dictated by:   Roetta Sessions, M.D. Attending Physician:  Malissa Hippo DD:  08/15/01 TD:  08/15/01 Job: [id number]

## 2011-05-03 ENCOUNTER — Other Ambulatory Visit (HOSPITAL_COMMUNITY): Payer: BC Managed Care – PPO

## 2011-05-04 ENCOUNTER — Encounter (HOSPITAL_COMMUNITY): Payer: PRIVATE HEALTH INSURANCE | Attending: Oncology

## 2011-05-04 DIAGNOSIS — Z860100 Personal history of colon polyps, unspecified: Secondary | ICD-10-CM | POA: Insufficient documentation

## 2011-05-04 DIAGNOSIS — R937 Abnormal findings on diagnostic imaging of other parts of musculoskeletal system: Secondary | ICD-10-CM | POA: Insufficient documentation

## 2011-05-04 DIAGNOSIS — R9389 Abnormal findings on diagnostic imaging of other specified body structures: Secondary | ICD-10-CM

## 2011-05-04 DIAGNOSIS — F411 Generalized anxiety disorder: Secondary | ICD-10-CM | POA: Insufficient documentation

## 2011-05-04 DIAGNOSIS — I10 Essential (primary) hypertension: Secondary | ICD-10-CM | POA: Insufficient documentation

## 2011-05-04 DIAGNOSIS — K219 Gastro-esophageal reflux disease without esophagitis: Secondary | ICD-10-CM | POA: Insufficient documentation

## 2011-05-04 LAB — CBC
HCT: 38.3 % — ABNORMAL LOW (ref 39.0–52.0)
MCH: 23.8 pg — ABNORMAL LOW (ref 26.0–34.0)
MCV: 70.7 fL — ABNORMAL LOW (ref 78.0–100.0)
RBC: 5.42 MIL/uL (ref 4.22–5.81)
RDW: 16.3 % — ABNORMAL HIGH (ref 11.5–15.5)
WBC: 5.3 10*3/uL (ref 4.0–10.5)

## 2011-05-04 LAB — COMPREHENSIVE METABOLIC PANEL
BUN: 15 mg/dL (ref 6–23)
CO2: 25 mEq/L (ref 19–32)
Chloride: 104 mEq/L (ref 96–112)
Creatinine, Ser: 1.02 mg/dL (ref 0.50–1.35)
GFR calc non Af Amer: 72 mL/min — ABNORMAL LOW (ref >90)
Total Bilirubin: 0.4 mg/dL (ref 0.3–1.2)

## 2011-05-04 LAB — DIFFERENTIAL
Lymphocytes Relative: 23 % (ref 12–46)
Monocytes Absolute: 0.5 10*3/uL (ref 0.1–1.0)
Monocytes Relative: 9 % (ref 3–12)
Neutro Abs: 3.4 10*3/uL (ref 1.7–7.7)

## 2011-05-04 LAB — PSA: PSA: 0.38 ng/mL (ref <=4.00)

## 2011-05-04 NOTE — Progress Notes (Signed)
Labs drawn today for cbc/diff,cmp,mm panel,kllc,psa

## 2011-05-05 LAB — KAPPA/LAMBDA LIGHT CHAINS: Kappa, lambda light chain ratio: 1.16 (ref 0.26–1.65)

## 2011-05-07 LAB — MULTIPLE MYELOMA PANEL, SERUM
Albumin ELP: 59.8 % (ref 55.8–66.1)
Alpha-1-Globulin: 4.7 % (ref 2.9–4.9)
Gamma Globulin: 13.4 % (ref 11.1–18.8)
IgG (Immunoglobin G), Serum: 973 mg/dL (ref 650–1600)

## 2011-05-12 ENCOUNTER — Encounter (HOSPITAL_BASED_OUTPATIENT_CLINIC_OR_DEPARTMENT_OTHER): Payer: PRIVATE HEALTH INSURANCE | Admitting: Oncology

## 2011-05-12 ENCOUNTER — Encounter (HOSPITAL_COMMUNITY): Payer: Self-pay | Admitting: Oncology

## 2011-05-12 VITALS — BP 143/80 | HR 68 | Temp 98.3°F | Ht 69.0 in | Wt 217.6 lb

## 2011-05-12 DIAGNOSIS — M810 Age-related osteoporosis without current pathological fracture: Secondary | ICD-10-CM

## 2011-05-12 DIAGNOSIS — R937 Abnormal findings on diagnostic imaging of other parts of musculoskeletal system: Secondary | ICD-10-CM

## 2011-05-12 DIAGNOSIS — K219 Gastro-esophageal reflux disease without esophagitis: Secondary | ICD-10-CM

## 2011-05-12 DIAGNOSIS — R9389 Abnormal findings on diagnostic imaging of other specified body structures: Secondary | ICD-10-CM

## 2011-05-12 HISTORY — DX: Abnormal findings on diagnostic imaging of other parts of musculoskeletal system: R93.7

## 2011-05-12 NOTE — Patient Instructions (Signed)
[facility]  Discharge Instructions  RECOMMENDATIONS MADE BY THE CONSULTANT AND ANY TEST RESULTS WILL BE SENT TO YOUR REFERRING DOCTOR.   EXAM FINDINGS BY MD TODAY AND SIGNS AND SYMPTOMS TO REPORT TO CLINIC OR PRIMARY MD: Exam per Jenita Seashore PA   INSTRUCTIONS GIVEN AND DISCUSSED: Bone density in next few weeks  SPECIAL INSTRUCTIONS/FOLLOW-UP: Labs in 9 months and see Dr. After labs   I acknowledge that I have been informed and understand all the instructions given to me and received a copy. I do not have any more questions at this time, but understand that I may call the [facility] at [facility] at [phone] during business hours should I have any further questions or need assistance in obtaining follow-up care.    __________________________________________  _____________  __________ Signature of Patient or Authorized Representative            Date                   Time    __________________________________________ Nurse's Signature

## 2011-05-12 NOTE — Progress Notes (Signed)
Jared Perches, MD, MD [address] [place] [address]  1. Abnormal MRI, spine  dexlansoprazole (DEXILANT) 60 MG capsule, doxazosin (CARDURA) 2 MG tablet, amLODipine (NORVASC) 5 MG tablet, Naproxen Sodium (ALEVE PO), polyethylene glycol powder (GLYCOLAX/MIRALAX) powder, CBC, Differential, Comprehensive metabolic panel, Multiple myeloma panel, serum, Kappa/lambda light chains, DG Bone Density    CURRENT THERAPY: Observation; negative multiple myeloma work-up.  INTERVAL HISTORY: Jared Gill 71 y.o. male returns for  regular  visit for followup of of an abnormal MRI of spine that was worrisome for multiple myeloma. This renographic study was performed on 01/04/2010. Thus far, all tests and scans have been negative for multiple myeloma.  Patient reports he is doing very well.  He is been having some acid reflux symptomatology and reported to his primary care doctor, Dr. [name], who gave him a sample of dexilant. The patient reports good results with this medication.  We discussed future followup. Do to his history of an abnormal MRI of spine with a negative multiple myeloma workup, we like to perform a bone density scan in the upcoming future. If he requires treatment for this, we will likely start with calcium and vitamin D and a subsequent bone density scan for followup. He may require Prolia every six-month injection if he is found to be osteoporotic without improvement with vitamin D and calcium.  We also discussed lab work which will include a multiple myeloma panel in 9 months time with subsequent followup.  ROS: No TIA's or unusual headaches, no dysphagia.  No prolonged cough. No dyspnea or chest pain on exertion.  No abdominal pain, change in bowel habits, black or bloody stools.  No urinary tract symptoms.  No new or unusual musculoskeletal symptoms.    Past Medical History  Diagnosis Date  . Hypertension   . Abnormal MRI, spine 05/12/2011    has ANXIETY; HYPERTENSION;  GERD; COLONIC POLYPS, HX OF; and Abnormal MRI, spine on his problem list.      has no known allergies.  Jared Gill does not currently have medications on file.  Past Surgical History  Procedure Date  . Cholecystectomy   . Polyp removal via colonoscopy     Denies any headaches, dizziness, double vision, fevers, chills, night sweats, nausea, vomiting, diarrhea, constipation, chest pain, heart palpitations, shortness of breath, blood in stool, black tarry stool, urinary pain, urinary burning, urinary frequency, hematuria.   PHYSICAL EXAMINATION  ECOG PERFORMANCE STATUS: 0 - Asymptomatic  Filed Vitals:   05/12/11 0900  BP: 143/80  Pulse: 68  Temp: 98.3 F (36.8 C)    GENERAL:alert, no distress, well nourished, well developed, comfortable, cooperative, obese and smiling SKIN: skin color, texture, turgor are normal, no rashes or significant lesions HEAD: Normocephalic, No masses, lesions, tenderness or abnormalities EYES: normal, PERRLA, EOMI, Conjunctiva are pink and non-injected EARS: External ears normal OROPHARYNX:lips, buccal mucosa, and tongue normal, mucous membranes are moist and poor dentition  NECK: supple, no adenopathy, trachea midline LYMPH:  no palpable lymphadenopathy BREAST:not examined LUNGS: clear to auscultation and percussion HEART: regular rate & rhythm, no murmurs, no gallops, S1 normal and S2 normal ABDOMEN:abdomen soft, non-tender, obese and normal bowel sounds BACK: Back symmetric, no curvature., No CVA tenderness EXTREMITIES:less then 2 second capillary refill, no joint deformities, effusion, or inflammation, no edema, no skin discoloration, no clubbing, no cyanosis  NEURO: alert & oriented x 3 with fluent speech, no focal motor/sensory deficits, gait normal   LABORATORY DATA: Results for Jared Gill (MRN  [id number]) as of 05/12/2011 10:58  Ref. Range 05/04/2011 09:25  Sodium Latest Range: 135-145 mEq/L 138  Potassium Latest Range: 3.5-5.1 mEq/L 3.5   Chloride Latest Range: 96-112 mEq/L 104  CO2 Latest Range: 19-32 mEq/L 25  BUN Latest Range: 6-23 mg/dL 15  Creat Latest Range: 0.50-1.35 mg/dL 4.09  Calcium Latest Range: 8.4-10.5 mg/dL 9.4  GFR calc non Af Amer Latest Range: >90 mL/min 72 (L)  GFR calc Af Amer Latest Range: >90 mL/min 83 (L)  Glucose Latest Range: 70-99 mg/dL 811 (H)  Alkaline Phosphatase Latest Range: 39-117 U/L 62  Albumin Latest Range: 55.8-66.1 % 3.8  AST Latest Range: 0-37 U/L 19  ALT Latest Range: 0-53 U/L 15  Total Protein Latest Range: 6.0-8.3 g/dL 7.0  Total Bilirubin Latest Range: 0.3-1.2 mg/dL 0.4  Kappa free light chain Latest Range: 0.33-1.94 mg/dL 9.14  Lamda free light chains Latest Range: 0.57-2.63 mg/dL 7.82  Kappa, lamda light chain ratio Latest Range: 0.26-1.65  1.16  WBC Latest Range: 4.0-10.5 K/uL 5.3  RBC Latest Range: 4.22-5.81 MIL/uL 5.42  HGB Latest Range: 13.0-17.0 g/dL 95.6 (L)  HCT Latest Range: 39.0-52.0 % 38.3 (L)  MCV Latest Range: 78.0-100.0 fL 70.7 (L)  MCH Latest Range: 26.0-34.0 pg 23.8 (L)  MCHC Latest Range: 30.0-36.0 g/dL 21.3  RDW Latest Range: 11.5-15.5 % 16.3 (H)  Platelets Latest Range: 150-400 K/uL 196  Neutrophils Relative Latest Range: 43-77 % 64  Lymphocytes Relative Latest Range: 12-46 % 23  Monocytes Relative Latest Range: 3-12 % 9  Eosinophils Relative Latest Range: 0-5 % 3  Basophils Relative Latest Range: 0-1 % 0  NEUT# Latest Range: 1.7-7.7 K/uL 3.4  Lymphocytes Absolute Latest Range: 0.7-4.0 K/uL 1.2  Monocytes Absolute Latest Range: 0.1-1.0 K/uL 0.5  Eosinophils Absolute Latest Range: 0.0-0.7 K/uL 0.2  Basophils Absolute Latest Range: 0.0-0.1 K/uL 0.0  PSA Latest Range: <=4.00 ng/mL 0.38      ASSESSMENT:  1. Per MRI scan on 01/06/2010, lesions were noted in the vertebrae more specifically if there are multifocal areas of marrow signal abnormality. Most prominent lesion identified at L1, L5, and S1. Vertebral body height and alignment are maintained.  Further workup in regards to this does not reveal multiple myeloma or malignancy at this point in time. 2. Hypertension 3. GERD, treated with dexilant 4. Polyp removal via colonoscopy in 2009 by Dr. Jena Gauss. Biopsies did not display any evidence of high-grade dysplasia or malignancy.  PLAN:  1. I personally reviewed and went over laboratory results with the patient. 2. Bone density within the next 2 months to evaluate for osteopenia/porosis. 3. Lab work in 9 months: CBC diff, CMET, MM panel 4. Return in 9 months for follow-up.   All questions were answered. The patient knows to call the clinic with any problems, questions or concerns. We can certainly see the patient much sooner if necessary.  The patient and plan discussed with Glenford Peers, MD and he is in agreement with the aforementioned.   Simonne Boulos

## 2011-05-17 ENCOUNTER — Ambulatory Visit (HOSPITAL_COMMUNITY): Payer: Self-pay | Admitting: Oncology

## 2011-05-24 ENCOUNTER — Encounter: Payer: Self-pay | Admitting: Oncology

## 2011-05-31 ENCOUNTER — Ambulatory Visit (HOSPITAL_COMMUNITY)
Admission: RE | Admit: 2011-05-31 | Discharge: 2011-05-31 | Disposition: A | Discharge status: Home / Self Care | Payer: PRIVATE HEALTH INSURANCE | Source: Ambulatory Visit | Attending: Oncology | Admitting: Oncology

## 2011-05-31 DIAGNOSIS — R937 Abnormal findings on diagnostic imaging of other parts of musculoskeletal system: Secondary | ICD-10-CM

## 2011-05-31 DIAGNOSIS — R9389 Abnormal findings on diagnostic imaging of other specified body structures: Secondary | ICD-10-CM | POA: Insufficient documentation

## 2011-07-05 ENCOUNTER — Encounter: Payer: Self-pay | Admitting: Oncology

## 2011-09-21 ENCOUNTER — Ambulatory Visit (INDEPENDENT_AMBULATORY_CARE_PROVIDER_SITE_OTHER): Payer: PRIVATE HEALTH INSURANCE | Admitting: Urology

## 2011-09-21 DIAGNOSIS — N401 Enlarged prostate with lower urinary tract symptoms: Secondary | ICD-10-CM

## 2011-09-21 DIAGNOSIS — Z125 Encounter for screening for malignant neoplasm of prostate: Secondary | ICD-10-CM

## 2012-01-13 ENCOUNTER — Encounter (HOSPITAL_COMMUNITY): Payer: Self-pay | Admitting: Emergency Medicine

## 2012-01-13 ENCOUNTER — Emergency Department (HOSPITAL_COMMUNITY)
Admission: EM | Admit: 2012-01-13 | Discharge: 2012-01-13 | Disposition: A | Discharge status: Home / Self Care | Payer: PRIVATE HEALTH INSURANCE | Attending: Emergency Medicine | Admitting: Emergency Medicine

## 2012-01-13 DIAGNOSIS — H109 Unspecified conjunctivitis: Secondary | ICD-10-CM

## 2012-01-13 DIAGNOSIS — H5789 Other specified disorders of eye and adnexa: Secondary | ICD-10-CM | POA: Insufficient documentation

## 2012-01-13 DIAGNOSIS — Z79899 Other long term (current) drug therapy: Secondary | ICD-10-CM | POA: Insufficient documentation

## 2012-01-13 DIAGNOSIS — I10 Essential (primary) hypertension: Secondary | ICD-10-CM | POA: Insufficient documentation

## 2012-01-13 DIAGNOSIS — F17200 Nicotine dependence, unspecified, uncomplicated: Secondary | ICD-10-CM | POA: Insufficient documentation

## 2012-01-13 MED ORDER — ERYTHROMYCIN 5 MG/GM OP OINT: TOPICAL_OINTMENT | Freq: Four times a day (QID) | OPHTHALMIC | Status: DC

## 2012-01-13 NOTE — ED Notes (Signed)
Pt c/o left eye irritation. ?

## 2012-01-13 NOTE — ED Provider Notes (Signed)
History     CSN: [id number]  Arrival date & time 01/13/12  4098   None     Chief Complaint  Patient presents with  . Eye Problem    left eye    (Consider location/radiation/quality/duration/timing/severity/associated sxs/prior treatment) HPI Comments: The patient states that he developed pain is in his left eye yesterday, has been persistent, associated with low bit of discharge which is clear. He denies sore throat, cough, fever, shortness of breath, nasal congestion.  Patient is a 72 y.o. male presenting with eye problem. The history is provided by the patient.  Eye Problem  This is a new problem. The current episode started yesterday. The problem occurs constantly. The problem has been gradually worsening. The left eye is affected.There was no injury mechanism. The pain is at a severity of 1/10. The pain is mild. There is no history of trauma to the eye. There is no known exposure to pink eye. He does not wear contacts. Associated symptoms include eye redness. Pertinent negatives include no blurred vision, no decreased vision, no double vision, no foreign body sensation, no photophobia, no nausea and no vomiting. He has tried nothing for the symptoms.    Past Medical History  Diagnosis Date  . Hypertension   . Abnormal MRI, spine 05/12/2011    Past Surgical History  Procedure Date  . Cholecystectomy   . Polyp removal via colonoscopy     Family History  Problem Relation Age of Onset  . Diabetes Father   . Diabetes Sister   . Cancer Brother     History  Substance Use Topics  . Smoking status: Current Some Day Smoker -- 0.5 packs/day for 40 years    Types: Cigarettes  . Smokeless tobacco: Never Used  . Alcohol Use: Yes     Comment: occasional beer      Review of Systems  Constitutional: Negative for fever.  Eyes: Positive for redness. Negative for blurred vision, double vision and photophobia.  Gastrointestinal: Negative for nausea and vomiting.    Allergies   Review of patient's allergies indicates no known allergies.  Home Medications   Current Outpatient Rx  Name  Route  Sig  Dispense  Refill  . AMLODIPINE BESYLATE 5 MG PO TABS   Oral   Take 5 mg by mouth daily.         . DEXLANSOPRAZOLE 60 MG PO CPDR   Oral   Take 60 mg by mouth daily.         [name] Kitchen DOXAZOSIN MESYLATE 2 MG PO TABS   Oral   Take 2 mg by mouth daily.         . ERYTHROMYCIN 5 MG/GM OP OINT   Ophthalmic   Apply to eye every 6 (six) hours. Place 1/2 inch ribbon of ointment in the affected eye 4 times a day   1 g   1     Dispense home with patient please   . ALEVE PO   Oral   Take by mouth as needed.         [name] Kitchen POLYETHYLENE GLYCOL 3350 PO POWD   Oral   Take 17 g by mouth as needed.           BP 165/83  Pulse 59  Temp 97.5 F (36.4 C) (Oral)  Resp 16  SpO2 99%  Physical Exam  Nursing note and vitals reviewed. Constitutional: He appears well-developed and well-nourished.  HENT:  Head: Normocephalic and atraumatic.  Mouth/Throat: Oropharynx is clear and  moist. No oropharyngeal exudate.  Eyes: EOM are normal. Pupils are equal, round, and reactive to light. Right eye exhibits no discharge. Left eye exhibits no discharge. No scleral icterus.       Mild conjunctival injection of the left eye, no ciliary flare, no hyphema, normal pupillary exam, normal extraocular movements. No discharge of the eyes  Cardiovascular: Normal rate and regular rhythm.   Pulmonary/Chest: Effort normal and breath sounds normal.  Neurological: He is alert.       Gait normal, speech normal  Skin: Skin is warm and dry. No rash noted.    ED Course  Procedures (including critical care time)  Labs Reviewed - No data to display No results found.   1. Conjunctivitis, left eye       MDM  The patient will treated with a topical antibiotic, he does not have a fever and otherwise appears well. There is no pain to the eye and no change in vision the side of the glaucoma as  the source of his symptoms. He will followup with the ophthalmologist in 24-48 hours if his symptoms are not better.  Erythromycin ointment given in the emergency department      Vida Roller, MD 01/13/12 [phone]

## 2012-02-07 ENCOUNTER — Encounter (HOSPITAL_COMMUNITY): Payer: PRIVATE HEALTH INSURANCE | Attending: Oncology

## 2012-02-07 DIAGNOSIS — R937 Abnormal findings on diagnostic imaging of other parts of musculoskeletal system: Secondary | ICD-10-CM

## 2012-02-07 LAB — DIFFERENTIAL
Basophils Absolute: 0 10*3/uL (ref 0.0–0.1)
Basophils Relative: 0 % (ref 0–1)
Eosinophils Absolute: 0.2 10*3/uL (ref 0.0–0.7)
Monocytes Relative: 12 % (ref 3–12)
Neutro Abs: 2.8 10*3/uL (ref 1.7–7.7)
Neutrophils Relative %: 58 % (ref 43–77)

## 2012-02-07 LAB — CBC
MCH: 23.3 pg — ABNORMAL LOW (ref 26.0–34.0)
MCHC: 32.2 g/dL (ref 30.0–36.0)
Platelets: 201 10*3/uL (ref 150–400)
RDW: 15.8 % — ABNORMAL HIGH (ref 11.5–15.5)

## 2012-02-07 LAB — COMPREHENSIVE METABOLIC PANEL
AST: 27 U/L (ref 0–37)
Albumin: 3.8 g/dL (ref 3.5–5.2)
Calcium: 9.5 mg/dL (ref 8.4–10.5)
Chloride: 106 mEq/L (ref 96–112)
Creatinine, Ser: 1.11 mg/dL (ref 0.50–1.35)
Sodium: 139 mEq/L (ref 135–145)
Total Bilirubin: 0.3 mg/dL (ref 0.3–1.2)

## 2012-02-07 NOTE — Progress Notes (Signed)
Labs drawn today for cbc/diff,cmp,kllc,mm panel 

## 2012-02-08 LAB — KAPPA/LAMBDA LIGHT CHAINS
Kappa, lambda light chain ratio: 0.68 (ref 0.26–1.65)
Lambda free light chains: 1.84 mg/dL (ref 0.57–2.63)

## 2012-02-09 ENCOUNTER — Encounter (HOSPITAL_COMMUNITY): Payer: Self-pay | Admitting: Oncology

## 2012-02-09 ENCOUNTER — Encounter (HOSPITAL_BASED_OUTPATIENT_CLINIC_OR_DEPARTMENT_OTHER): Payer: PRIVATE HEALTH INSURANCE | Admitting: Oncology

## 2012-02-09 VITALS — BP 150/84 | HR 64 | Temp 97.0°F | Resp 20 | Wt 229.1 lb

## 2012-02-09 DIAGNOSIS — R937 Abnormal findings on diagnostic imaging of other parts of musculoskeletal system: Secondary | ICD-10-CM

## 2012-02-09 NOTE — Progress Notes (Signed)
Problem #1 abnormal MRI scan, nonspecific, suspicious for myeloma but with no laboratory abnormalities consistent with that diagnosis. His laboratory work once again shows no M spike, increase in light chains, etc. he has no hypercalcemia, thrombocytopenia, his hematocrit is normal, however his IgM level is minimally low. Therefore we will repeat his lab work in 12 months and see him back. He is reluctant to do a repeat MRI because of the cost but he has no back pain whatsoever. The MRI was done because he fell in the kitchen had some immediate back discomfort but that has disappeared long ago.

## 2012-02-09 NOTE — Patient Instructions (Addendum)
[facility] Discharge Instructions  RECOMMENDATIONS MADE BY THE CONSULTANT AND ANY TEST RESULTS WILL BE SENT TO YOUR REFERRING PHYSICIAN.  EXAM FINDINGS BY THE PHYSICIAN TODAY AND SIGNS OR SYMPTOMS TO REPORT TO CLINIC OR PRIMARY PHYSICIAN: Exam and discussion by MD.  Bonita Quin are doing well.  We don't have all of your blood work back but if there is anything abnormal we will call you.  Report any pain, night sweats or other problems.  MEDICATIONS PRESCRIBED:  none  SPECIAL INSTRUCTIONS/FOLLOW-UP: Return for labs in 1 year and then to see PA.  Thank you for choosing [facility] to provide your oncology and hematology care.  To afford each patient quality time with our providers, please arrive at least 15 minutes before your scheduled appointment time.  With your help, our goal is to use those 15 minutes to complete the necessary work-up to ensure our physicians have the information they need to help with your evaluation and healthcare recommendations.    Effective January 1st, 2014, we ask that you re-schedule your appointment with our physicians should you arrive 10 or more minutes late for your appointment.  We strive to give you quality time with our providers, and arriving late affects you and other patients whose appointments are after yours.    Again, thank you for choosing [facility].  Our hope is that these requests will decrease the amount of time that you wait before being seen by our physicians.       _____________________________________________________________  I acknowledge that I have been informed and understand all the instructions given to me and received a copy. I do not have anymore questions at this time but understand that I may call the Cancer Center at [facility] at [phone] during business hours should I have any further questions or need assistance in obtaining follow-up  care.    __________________________________________  _____________  __________ Signature of Patient or Authorized Representative            Date                   Time    __________________________________________ Nurse's Signature

## 2012-02-10 LAB — MULTIPLE MYELOMA PANEL, SERUM
Albumin ELP: 61.7 % (ref 55.8–66.1)
Alpha-1-Globulin: 4.2 % (ref 2.9–4.9)
Beta 2: 6.1 % (ref 3.2–6.5)
Beta Globulin: 6.6 % (ref 4.7–7.2)
IgM, Serum: 29 mg/dL — ABNORMAL LOW (ref 41–251)

## 2012-05-11 ENCOUNTER — Encounter (HOSPITAL_COMMUNITY): Payer: Self-pay | Admitting: *Deleted

## 2012-05-11 ENCOUNTER — Emergency Department (HOSPITAL_COMMUNITY)
Admission: EM | Admit: 2012-05-11 | Discharge: 2012-05-11 | Disposition: A | Discharge status: Home / Self Care | Payer: PRIVATE HEALTH INSURANCE | Attending: Emergency Medicine | Admitting: Emergency Medicine

## 2012-05-11 DIAGNOSIS — Z79899 Other long term (current) drug therapy: Secondary | ICD-10-CM | POA: Insufficient documentation

## 2012-05-11 DIAGNOSIS — IMO0002 Reserved for concepts with insufficient information to code with codable children: Secondary | ICD-10-CM | POA: Insufficient documentation

## 2012-05-11 DIAGNOSIS — Y92009 Unspecified place in unspecified non-institutional (private) residence as the place of occurrence of the external cause: Secondary | ICD-10-CM | POA: Insufficient documentation

## 2012-05-11 DIAGNOSIS — Y9389 Activity, other specified: Secondary | ICD-10-CM | POA: Insufficient documentation

## 2012-05-11 DIAGNOSIS — Z87891 Personal history of nicotine dependence: Secondary | ICD-10-CM | POA: Insufficient documentation

## 2012-05-11 DIAGNOSIS — I10 Essential (primary) hypertension: Secondary | ICD-10-CM | POA: Insufficient documentation

## 2012-05-11 DIAGNOSIS — S0510XA Contusion of eyeball and orbital tissues, unspecified eye, initial encounter: Secondary | ICD-10-CM | POA: Insufficient documentation

## 2012-05-11 MED ORDER — PROPARACAINE HCL 0.5 % OP SOLN: 1.0000 [drp] | Freq: Once | OPHTHALMIC | Status: DC

## 2012-05-11 MED ORDER — TETRACAINE HCL 0.5 % OP SOLN: 2.0000 [drp] | Freq: Once | OPHTHALMIC | Status: AC

## 2012-05-11 MED ORDER — FLUORESCEIN SODIUM 1 MG OP STRP
ORAL_STRIP | OPHTHALMIC | Status: AC
  Administered 2012-05-11: 1
  Filled 2012-05-11: qty 1

## 2012-05-11 MED ORDER — TETRACAINE HCL 0.5 % OP SOLN
OPHTHALMIC | Status: AC
  Filled 2012-05-11: qty 2

## 2012-05-11 NOTE — ED Notes (Signed)
Pain lt eye , struck in left eye last night at work,  [employer] on a spray struck pt in lt eye. Works in Surveyor, mining here. At [facility]

## 2012-05-11 NOTE — ED Provider Notes (Signed)
History  This chart was scribed for Gilda Crease, MD by Shari Heritage, ED Scribe. The patient was seen in room APAH4/APAH4. Patient's care was started at 1544.   CSN: [id number]  Arrival date & time 05/11/12  1510   First MD Initiated Contact with Patient 05/11/12 1544      Chief Complaint  Patient presents with  . Eye Pain     The history is provided by the patient. No language interpreter was used.     MUHSIN Gill is a 72 y.o. male who presents to the Emergency Department complaining of moderate, constant, non-radiating left eye pain onset last night. Patient says that he was wearing his glasses when an object struck him in the face on the right side. He states that he was at work last night here in the kitchen when the nozzle on a water hose struck him in the eye. Patient denies any visual changes or fever.  He reports no other pain at this time. Patient has a medical history of hypertension.   Past Medical History  Diagnosis Date  . Hypertension   . Abnormal MRI, spine 05/12/2011    Past Surgical History  Procedure Laterality Date  . Cholecystectomy    . Polyp removal via colonoscopy      Family History  Problem Relation Age of Onset  . Diabetes Father   . Diabetes Sister   . Cancer Brother     History  Substance Use Topics  . Smoking status: Former Smoker -- 0.50 packs/day for 40 years    Types: Cigarettes  . Smokeless tobacco: Never Used  . Alcohol Use: Yes     Comment: occasional beer      Review of Systems  Constitutional: Negative for fever.  Eyes: Negative for visual disturbance.  All other systems reviewed and are negative.    Allergies  Review of patient's allergies indicates no known allergies.  Home Medications   Current Outpatient Rx  Name  Route  Sig  Dispense  Refill  . amLODipine (NORVASC) 5 MG tablet   Oral   Take 5 mg by mouth daily.         [name] Kitchen doxazosin (CARDURA) 2 MG tablet   Oral   Take 2 mg by mouth daily.         [name] Kitchen erythromycin ophthalmic ointment   Ophthalmic   Apply to eye every 6 (six) hours. Place 1/2 inch ribbon of ointment in the affected eye 4 times a day   1 g   1     Dispense home with patient please   . Naproxen Sodium (ALEVE PO)   Oral   Take by mouth as needed.         . polyethylene glycol powder (GLYCOLAX/MIRALAX) powder   Oral   Take 17 g by mouth as needed.           Triage Vitals: BP 121/61  Pulse 60  Temp(Src) 97.5 F (36.4 C) (Oral)  Resp 18  Ht 5\' 10"  (1.778 m)  Wt 225 lb 8 oz (102.286 kg)  BMI 32.36 kg/m2  SpO2 97%  Physical Exam  Constitutional: He is oriented to person, place, and time. He appears well-developed and well-nourished. No distress.  HENT:  Head: Normocephalic and atraumatic.  Right Ear: Hearing normal.  Nose: Nose normal.  Mouth/Throat: Oropharynx is clear and moist and mucous membranes are normal.  Eyes: EOM and lids are normal. Pupils are equal, round, and reactive to light. Left eye  exhibits no chemosis, no discharge and no exudate. No foreign body present in the left eye. Left conjunctiva is injected. Left conjunctiva has no hemorrhage. Left eye exhibits normal extraocular motion.  Fundoscopic exam:      The left eye shows no hemorrhage and no papilledema.  fluoroscein woods lamp exam - no uptake (negative abrasion, ulcer, dendrites)  No hyphema  IOP = 22 left eye  Neck: Normal range of motion. Neck supple.  Cardiovascular: Normal rate, regular rhythm, S1 normal and S2 normal.  Exam reveals no gallop and no friction rub.   No murmur heard. Pulmonary/Chest: Effort normal and breath sounds normal. No respiratory distress. He exhibits no tenderness.  Abdominal: Soft. Normal appearance and bowel sounds are normal. There is no hepatosplenomegaly. There is no tenderness. There is no rebound, no guarding, no tenderness at McBurney's point and negative Murphy's sign. No hernia.  Musculoskeletal: Normal range of motion.  Neurological:  He is alert and oriented to person, place, and time. He has normal strength. No cranial nerve deficit or sensory deficit. Coordination normal. GCS eye subscore is 4. GCS verbal subscore is 5. GCS motor subscore is 6.  Skin: Skin is warm, dry and intact. No rash noted. No cyanosis.  Psychiatric: He has a normal mood and affect. His speech is normal and behavior is normal. Thought content normal.    ED Course  Procedures (including critical care time) DIAGNOSTIC STUDIES: Oxygen Saturation is 97% on room air, adequate by my interpretation.    COORDINATION OF CARE: 3:47 PM- Patient informed of current plan for treatment and evaluation and agrees with plan at this time.     Labs Reviewed - No data to display No results found.   Diagnosis: Contusion left eye    MDM  She reports that a close hit him in the left eye while he was washing dishes yesterday. He has pain without visual disturbance. Examination reveals very slight injection of the left conjunctiva without any other findings. There is no subconjunctival hemorrhage, hyphema, abrasion. Patient has full extraocular muscle movement without any pain or double vision, no concern for blowout fracture. Patient reassured, no further treatment necessary at this time.     I personally performed the services described in this documentation, which was scribed in my presence. The recorded information has been reviewed and is accurate.   Gilda Crease, MD 05/11/12 [phone]

## 2012-09-20 ENCOUNTER — Other Ambulatory Visit (HOSPITAL_COMMUNITY): Payer: Self-pay | Admitting: Internal Medicine

## 2012-09-20 ENCOUNTER — Ambulatory Visit (HOSPITAL_COMMUNITY)
Admission: RE | Admit: 2012-09-20 | Discharge: 2012-09-20 | Disposition: A | Discharge status: Home / Self Care | Payer: PRIVATE HEALTH INSURANCE | Source: Ambulatory Visit | Attending: Internal Medicine | Admitting: Internal Medicine

## 2012-09-20 DIAGNOSIS — Z87898 Personal history of other specified conditions: Secondary | ICD-10-CM | POA: Insufficient documentation

## 2012-09-20 DIAGNOSIS — R222 Localized swelling, mass and lump, trunk: Secondary | ICD-10-CM | POA: Insufficient documentation

## 2012-09-27 ENCOUNTER — Ambulatory Visit (INDEPENDENT_AMBULATORY_CARE_PROVIDER_SITE_OTHER): Payer: No Typology Code available for payment source | Admitting: Surgery

## 2012-10-11 ENCOUNTER — Ambulatory Visit (INDEPENDENT_AMBULATORY_CARE_PROVIDER_SITE_OTHER): Payer: No Typology Code available for payment source | Admitting: Surgery

## 2012-10-11 ENCOUNTER — Encounter (INDEPENDENT_AMBULATORY_CARE_PROVIDER_SITE_OTHER): Payer: Self-pay | Admitting: Surgery

## 2012-10-11 VITALS — BP 142/80 | HR 60 | Resp 16 | Ht 70.0 in | Wt 225.2 lb

## 2012-10-11 DIAGNOSIS — D1720 Benign lipomatous neoplasm of skin and subcutaneous tissue of unspecified limb: Secondary | ICD-10-CM

## 2012-10-11 DIAGNOSIS — D1739 Benign lipomatous neoplasm of skin and subcutaneous tissue of other sites: Secondary | ICD-10-CM

## 2012-10-11 NOTE — Patient Instructions (Signed)
See me again in six or eight weeks and we will re-evaluate the area on your right upper arm

## 2012-10-11 NOTE — Progress Notes (Signed)
NAMEMAXEY RANSOM DOB: 1940-09-05 MRN: [id number]                                                                                      DATE: 10/11/2012  PCP: Carylon Perches, MD Referring Provider: No ref. provider found  IMPRESSION:  Lipoma right anterior axilla/upper arm  PLAN:   Will re-evaluate in a few months. I don't believe this is the cause of his arm symptoms and removal can be deferred                 CC: No chief complaint on file.   HPI:  NAOKI MIGLIACCIO is a 72 y.o.  male who presents for evaluation of a right upper arm/axillary mass. It has apparently been jsut noted recently. While it doesn't hurt per se, he does note some numbness in the right arm and shooting pains which he thinks are caused by the mass. He doesn't have any other similar ones.  PMH:  has a past medical history of Hypertension and Abnormal MRI, spine (05/12/2011).  PSH:   has past surgical history that includes Cholecystectomy and polyp removal via colonoscopy.  ALLERGIES:  No Known Allergies  MEDICATIONS: Current outpatient prescriptions:amLODipine (NORVASC) 5 MG tablet, Take 5 mg by mouth daily., Disp: , Rfl: ;  Dexlansoprazole (DEXILANT PO), Take by mouth., Disp: , Rfl: ;  doxazosin (CARDURA) 2 MG tablet, Take 2 mg by mouth daily., Disp: , Rfl: ;  polyethylene glycol powder (GLYCOLAX/MIRALAX) powder, Take 17 g by mouth daily as needed (for constipation). , Disp: , Rfl:  prochlorperazine (COMPAZINE) 10 MG tablet, Take 10 mg by mouth every 6 (six) hours as needed., Disp: , Rfl: ;  tetrahydrozoline 0.05 % ophthalmic solution, Place 1 drop into both eyes daily as needed (redness relief)., Disp: , Rfl: ;  traMADol (ULTRAM) 50 MG tablet, Take 50 mg by mouth every 6 (six) hours as needed for pain., Disp: , Rfl:   ROS: He has filled out our 12 point review of systems and it is negative . EXAM:   VITAL SIGNS: BP 142/80  Pulse 60  Resp 16  Ht 5\' 10"  (1.778 m)  Wt 225 lb 3.2 oz (102.15 kg)  BMI 32.31  kg/m2  GENERAL:  The patient is alert, oriented, and generally healthy-appearing, NAD. Mood and affect are normal.   LUNGS:  Normal respirations and clear to auscultation.  HEART:  Regular rhythm, with no murmurs rubs or gallops. Pulses are intact carotid dorsalis pedis and posterior tibial. No significant varicosities are noted.  EXTREMITIES:  Good range of motion, no edema.There is a well circumscribed mass in the right anterior axillary line at the jct of the arm and shoulder. It is soft, not tender, not fixed deep.    DATA REVIEWED:  Notes in Epic    Juliene Kirsh J 10/11/2012  CC: No ref. provider found, Carylon Perches, MD

## 2012-11-21 ENCOUNTER — Encounter (INDEPENDENT_AMBULATORY_CARE_PROVIDER_SITE_OTHER): Payer: No Typology Code available for payment source | Admitting: Surgery

## 2012-11-24 ENCOUNTER — Ambulatory Visit (INDEPENDENT_AMBULATORY_CARE_PROVIDER_SITE_OTHER): Payer: No Typology Code available for payment source | Admitting: Surgery

## 2012-11-24 ENCOUNTER — Encounter (INDEPENDENT_AMBULATORY_CARE_PROVIDER_SITE_OTHER): Payer: Self-pay | Admitting: Surgery

## 2012-11-24 VITALS — BP 128/70 | HR 72 | Temp 97.1°F | Resp 16 | Ht 70.0 in | Wt 227.2 lb

## 2012-11-24 DIAGNOSIS — D1720 Benign lipomatous neoplasm of skin and subcutaneous tissue of unspecified limb: Secondary | ICD-10-CM | POA: Insufficient documentation

## 2012-11-24 DIAGNOSIS — D1739 Benign lipomatous neoplasm of skin and subcutaneous tissue of other sites: Secondary | ICD-10-CM

## 2012-11-24 NOTE — Patient Instructions (Signed)
We will see you again on an as needed basis. Please call the office at [phone] if you have any questions or concerns. Thank you for allowing us to take care of you.  

## 2012-11-24 NOTE — Progress Notes (Signed)
NAME: Jared Gill DOB: Mar 22, 1940 MRN: [id number]                                                                                      DATE: 11/24/2012  PCP: Carylon Perches, MD Referring Provider: Carylon Perches, MD  IMPRESSION:  Lipoma right anterior axilla/upper arm I think his pain sounds more like nerve compression and/or some arthritic/ joint pain. Do not believe the lipoma is symptomatic  PLAN:  I told him that we can remove this lipoma as an outpatient under anesthesia. However I did not think this was going to improve his pain. I thought he needed further evaluation by his primary physician for the pain symptoms. He does not want surgery if we don't think we are going to make his pain better. We will see him back when necessary.                 CC:  Chief Complaint  Patient presents with  . Routine Post Op    re-eval rt anterior axillary mass    HPI:  Jared Gill is a 72 y.o.  male who presents for re- evaluation of a right upper arm/axillary mass. It has not changed since I saw him in August. While it doesn't hurt per se, he does note some numbness in the right arm and shooting pains. These are unchanged since I saw him in August as well  PMH:  has a past medical history of Hypertension and Abnormal MRI, spine (05/12/2011).  PSH:   has past surgical history that includes Cholecystectomy and polyp removal via colonoscopy.  ALLERGIES:  No Known Allergies  MEDICATIONS: Current outpatient prescriptions:amLODipine (NORVASC) 5 MG tablet, Take 5 mg by mouth daily., Disp: , Rfl: ;  Dexlansoprazole (DEXILANT PO), Take by mouth., Disp: , Rfl: ;  doxazosin (CARDURA) 2 MG tablet, Take 2 mg by mouth daily., Disp: , Rfl: ;  polyethylene glycol powder (GLYCOLAX/MIRALAX) powder, Take 17 g by mouth daily as needed (for constipation). , Disp: , Rfl:  tetrahydrozoline 0.05 % ophthalmic solution, Place 1 drop into both eyes daily as needed (redness relief)., Disp: , Rfl: ;  prochlorperazine (COMPAZINE)  10 MG tablet, Take 10 mg by mouth every 6 (six) hours as needed., Disp: , Rfl: ;  traMADol (ULTRAM) 50 MG tablet, Take 50 mg by mouth every 6 (six) hours as needed for pain., Disp: , Rfl:   ROS: He has filled out our 12 point review of systems and it is negative . EXAM:   VITAL SIGNS: BP 128/70  Pulse 72  Temp(Src) 97.1 F (36.2 C) (Temporal)  Resp 16  Ht 5\' 10"  (1.778 m)  Wt 227 lb 3.2 oz (103.057 kg)  BMI 32.6 kg/m2  GENERAL:  The patient is alert, oriented, and generally healthy-appearing, NAD. Mood and affect are normal.   LUNGS:  Normal respirations and clear to auscultation.  HEART:  Regular rhythm, with no murmurs rubs or gallops. Pulses are intact carotid dorsalis pedis and posterior tibial. No significant varicosities are noted.  EXTREMITIES:  Good range of motion, no edema.There is a well circumscribed mass in the right anterior axillary line at the jct  of the arm and shoulder. It is soft, not tender, not fixed deep. This is the same as it appeared previously. It is about 6 cm in diameter.   DATA REVIEWED:     Currie Paris 11/24/2012  CC: Carylon Perches, MD, Carylon Perches, MD

## 2013-02-06 ENCOUNTER — Other Ambulatory Visit (HOSPITAL_COMMUNITY): Payer: Self-pay | Admitting: Oncology

## 2013-02-06 DIAGNOSIS — R937 Abnormal findings on diagnostic imaging of other parts of musculoskeletal system: Secondary | ICD-10-CM

## 2013-02-07 ENCOUNTER — Other Ambulatory Visit (HOSPITAL_COMMUNITY): Payer: PRIVATE HEALTH INSURANCE

## 2013-02-07 ENCOUNTER — Encounter (HOSPITAL_COMMUNITY): Payer: PRIVATE HEALTH INSURANCE | Attending: Hematology and Oncology

## 2013-02-07 DIAGNOSIS — D649 Anemia, unspecified: Secondary | ICD-10-CM | POA: Insufficient documentation

## 2013-02-07 DIAGNOSIS — R937 Abnormal findings on diagnostic imaging of other parts of musculoskeletal system: Secondary | ICD-10-CM

## 2013-02-07 DIAGNOSIS — R718 Other abnormality of red blood cells: Secondary | ICD-10-CM

## 2013-02-07 DIAGNOSIS — M549 Dorsalgia, unspecified: Secondary | ICD-10-CM | POA: Insufficient documentation

## 2013-02-07 LAB — CBC WITH DIFFERENTIAL/PLATELET
Basophils Absolute: 0 10*3/uL (ref 0.0–0.1)
Eosinophils Relative: 3 % (ref 0–5)
Lymphocytes Relative: 20 % (ref 12–46)
MCV: 72.6 fL — ABNORMAL LOW (ref 78.0–100.0)
Neutrophils Relative %: 65 % (ref 43–77)
Platelets: 210 10*3/uL (ref 150–400)
RDW: 16.1 % — ABNORMAL HIGH (ref 11.5–15.5)
WBC: 5 10*3/uL (ref 4.0–10.5)

## 2013-02-07 LAB — COMPREHENSIVE METABOLIC PANEL
ALT: 19 U/L (ref 0–53)
AST: 28 U/L (ref 0–37)
CO2: 23 mEq/L (ref 19–32)
Calcium: 9.2 mg/dL (ref 8.4–10.5)
GFR calc non Af Amer: 82 mL/min — ABNORMAL LOW (ref >90)
Sodium: 138 mEq/L (ref 135–145)
Total Protein: 6.8 g/dL (ref 6.0–8.3)

## 2013-02-07 NOTE — Progress Notes (Signed)
Jared Perches, MD [address] [place] [address]  Abnormal MRI, spine - Plan: MR Lumbar Spine W Wo Contrast  RBC microcytosis - Plan: Iron and TIBC, Ferritin  Anemia, unspecified - Plan: Iron and TIBC, Ferritin  CURRENT THERAPY: Observation  INTERVAL HISTORY: Jared Gill 72 y.o. male returns for  regular  visit for followup of abnormal MRI of spine in November 2011 after falling and having acute back pain.  Bone scan on 01/12/2010 is negative for any disease.  Jared Gill is reluctant to have follow-up MRI due to cost.  Lab work is unimpressive for any abnormalities and no M-spike identified.  Patient was referred to [place] by Dr. [name] in 2011.  I personally reviewed and went over laboratory results with the patient.  His multiple myeloma panel is pending.  I notice a stable minimal anemia, but MCV is noted to be low.  I will add iron studies to labs performed yesterday.  I personally reviewed and went over laboratory results with the patient.  The patient underwent a CT of chest in July 2014 which was negative.  This was ordered by Dr. [name] for a right axillary mass.  He was subsequently seen by Dr. Jamey Gill who removed a lipoma of the right axilla.   Jared Gill is doing well.  He is asymptomatic and denies any B symptoms.   I have offered him a repeat MRI of Lumbar spine that he has declined in the past.  We will follow-up on his abnormal L spine from 2011.  If it remains abnormal, it would be reasonable to perform a biopsy to complete the work-up for malignancy.  In the past, work-up has been negative.   We discussed this information and he seem agreeable to this.   Oncologically, he denies any complaints and ROS questioning is negative.   Past Medical History  Diagnosis Date  . Hypertension   . Abnormal MRI, spine 05/12/2011    has ANXIETY; HYPERTENSION; GERD; COLONIC POLYPS, HX OF; Abnormal MRI, spine; and Lipoma of axilla on his problem list.     has No  Known Allergies.  [name] does not currently have medications on file.  Past Surgical History  Procedure Laterality Date  . Cholecystectomy    . Polyp removal via colonoscopy      Denies any headaches, dizziness, double vision, fevers, chills, night sweats, nausea, vomiting, diarrhea, constipation, chest pain, heart palpitations, shortness of breath, blood in stool, black tarry stool, urinary pain, urinary burning, urinary frequency, hematuria.   PHYSICAL EXAMINATION  ECOG PERFORMANCE STATUS: 0 - Asymptomatic  Filed Vitals:   02/08/13 0946  BP: 141/78  Pulse: 70  Temp: 97 F (36.1 C)  Resp: 20    GENERAL:alert, no distress, well nourished, well developed, comfortable, cooperative and smiling SKIN: skin color, texture, turgor are normal, no rashes or significant lesions HEAD: Normocephalic, No masses, lesions, tenderness or abnormalities EYES: normal, PERRLA, EOMI, Conjunctiva are pink and non-injected EARS: External ears normal OROPHARYNX:mucous membranes are moist  NECK: supple, no adenopathy, trachea midline LYMPH:  no palpable lymphadenopathy BREAST:not examined LUNGS: clear to auscultation  HEART: regular rate & rhythm, no murmurs and no gallops ABDOMEN:abdomen soft, non-tender, obese and normal bowel sounds BACK: Back symmetric, no curvature., No CVA tenderness EXTREMITIES:less then 2 second capillary refill, no joint deformities, effusion, or inflammation, no edema, no skin discoloration, no clubbing, no cyanosis  NEURO: alert & oriented x 3 with fluent speech, no focal motor/sensory deficits, gait  normal   LABORATORY DATA: CBC    Component Value Date/Time   WBC 5.0 02/07/2013 0943   RBC 5.22 02/07/2013 0943   HGB 12.3* 02/07/2013 0943   HCT 37.9* 02/07/2013 0943   PLT 210 02/07/2013 0943   MCV 72.6* 02/07/2013 0943   MCH 23.6* 02/07/2013 0943   MCHC 32.5 02/07/2013 0943   RDW 16.1* 02/07/2013 0943   LYMPHSABS 1.0 02/07/2013 0943   MONOABS 0.6 02/07/2013  0943   EOSABS 0.2 02/07/2013 0943   BASOSABS 0.0 02/07/2013 0943      Chemistry      Component Value Date/Time   NA 138 02/07/2013 0943   K 3.7 02/07/2013 0943   CL 104 02/07/2013 0943   CO2 23 02/07/2013 0943   BUN 14 02/07/2013 0943   CREATININE 0.94 02/07/2013 0943      Component Value Date/Time   CALCIUM 9.2 02/07/2013 0943   ALKPHOS 54 02/07/2013 0943   AST 28 02/07/2013 0943   ALT 19 02/07/2013 0943   BILITOT 0.3 02/07/2013 0943        PENDING LABS: MM panel, iron studies   RADIOGRAPHIC STUDIES:  09/27/12  *RADIOLOGY REPORT*  Clinical Data: Right axillary mass at physical exam. History of  multiple myeloma.  CT CHEST WITHOUT CONTRAST  Technique: Multidetector CT imaging of the chest was performed  following the standard protocol without IV contrast.  Comparison: 02/09/2010 CT. No more recent similar comparison exam.  Findings: Small right axillary nodes are normal in size and  appearance. No new abnormality is identified than seen on either  axilla or the subcutaneous soft tissues of the chest.  Incomplete imaging of the upper abdomen demonstrates normal-  appearing adrenal glands and cholecystectomy clips. Heart size is  normal. Coronary arterial and aortic atherosclerotic calcification  noted. No pericardial or pleural effusion. Allowing for  noncontrast technique, no hilar or mid mediastinal lymphadenopathy.  Right apical greater than left pleural parenchymal scarring noted.  Mild emphysematous changes are re-identified. No new pulmonary  mass, nodule, or consolidation. Minimal bibasilar scarring is  noted.  No acute osseous abnormality or lytic or sclerotic osseous lesion.  Disc degenerative changes are noted.  IMPRESSION:  No acute intrathoracic process. Specifically, no right axillary  lymphadenopathy or other abnormality to explain the history of the  questioned palpable finding. Any further management should be  based on clinical assessment.    These results will be called to the ordering clinician or  representative by the Radiologist Assistant, and communication  documented in the PACS Dashboard.  Original Report Authenticated By: Christiana Pellant, M.D.     ASSESSMENT:  1. Abnormal MRI of spine in November 2011 after falling and having acute back pain.  Bone scan on 01/12/2010 is negative for any disease.  Trev is reluctant to have follow-up MRI due to cost.  Lab work is unimpressive for any abnormalities and no M-spike identified. 2. Microcytic anemia, stable  Patient Active Problem List   Diagnosis Date Noted  . Lipoma of axilla 11/24/2012  . Abnormal MRI, spine 05/12/2011  . COLONIC POLYPS, HX OF 11/06/2008  . ANXIETY 10/31/2008  . HYPERTENSION 10/31/2008  . GERD 10/31/2008     PLAN:  1. I personally reviewed and went over laboratory results with the patient. 2. I personally reviewed and went over radiographic studies with the patient. 3. Labs added: Iron/TIBC, ferritin 4. MRI L spine with and without contrast to follow-up on abnormal imaging in 2011.  Malignancy work-up is negative.  5. Return  in 1 year for follow-up.   THERAPY PLAN:  He has not demonstrated any finding to suggest malignancy since we started to see him for an abnormal MRI of Lumbar spine.  I will repeat MRI lumbar spine and depending on result, we will consider biopsy to definitively rule out any malignancy.  All questions were answered. The patient knows to call the clinic with any problems, questions or concerns. We can certainly see the patient much sooner if necessary.  Patient and plan discussed with Dr. Alla German and he is in agreement with the aforementioned.    Rilee Wendling

## 2013-02-07 NOTE — Progress Notes (Signed)
Jared Gill presented for labwork. Labs per MD order drawn via Peripheral Line 23 gauge needle inserted in right antecubital.  Good blood return present. Procedure without incident.  Needle removed intact. Patient tolerated procedure well.

## 2013-02-08 ENCOUNTER — Encounter (HOSPITAL_BASED_OUTPATIENT_CLINIC_OR_DEPARTMENT_OTHER): Payer: PRIVATE HEALTH INSURANCE | Admitting: Oncology

## 2013-02-08 VITALS — BP 141/78 | HR 70 | Temp 97.0°F | Resp 20 | Wt 230.5 lb

## 2013-02-08 DIAGNOSIS — R718 Other abnormality of red blood cells: Secondary | ICD-10-CM

## 2013-02-08 DIAGNOSIS — D649 Anemia, unspecified: Secondary | ICD-10-CM

## 2013-02-08 DIAGNOSIS — R937 Abnormal findings on diagnostic imaging of other parts of musculoskeletal system: Secondary | ICD-10-CM

## 2013-02-08 LAB — KAPPA/LAMBDA LIGHT CHAINS
Kappa, lambda light chain ratio: 0.57 (ref 0.26–1.65)
Lambda free light chains: 2.22 mg/dL (ref 0.57–2.63)

## 2013-02-08 NOTE — Patient Instructions (Signed)
[facility] Discharge Instructions  RECOMMENDATIONS MADE BY THE CONSULTANT AND ANY TEST RESULTS WILL BE SENT TO YOUR REFERRING PHYSICIAN.  EXAM FINDINGS BY THE PHYSICIAN TODAY AND SIGNS OR SYMPTOMS TO REPORT TO CLINIC OR PRIMARY PHYSICIAN: Exam and findings as discussed by Dellis Anes, PA-C.  Will do MRI of your spine after the holidays.  We will call you after the MRI.  MEDICATIONS PRESCRIBED:  none  INSTRUCTIONS/FOLLOW-UP: Follow-up in 1 year.    Thank you for choosing [facility] to provide your oncology and hematology care.  To afford each patient quality time with our providers, please arrive at least 15 minutes before your scheduled appointment time.  With your help, our goal is to use those 15 minutes to complete the necessary work-up to ensure our physicians have the information they need to help with your evaluation and healthcare recommendations.    Effective January 1st, 2014, we ask that you re-schedule your appointment with our physicians should you arrive 10 or more minutes late for your appointment.  We strive to give you quality time with our providers, and arriving late affects you and other patients whose appointments are after yours.    Again, thank you for choosing [facility].  Our hope is that these requests will decrease the amount of time that you wait before being seen by our physicians.       _____________________________________________________________  Should you have questions after your visit to [facility], please contact our office at [phone] between the hours of 8:30 a.m. and 5:00 p.m.  Voicemails left after 4:30 p.m. will not be returned until the following business day.  For prescription refill requests, have your pharmacy contact our office with your prescription refill request.

## 2013-02-08 NOTE — Addendum Note (Signed)
Addended byLeida Lauth on: 02/08/2013 10:08 AM   Modules accepted: Orders

## 2013-02-09 LAB — MULTIPLE MYELOMA PANEL, SERUM
Albumin ELP: 61.9 % (ref 55.8–66.1)
Alpha-1-Globulin: 4.5 % (ref 2.9–4.9)
IgG (Immunoglobin G), Serum: 857 mg/dL (ref 650–1600)
IgM, Serum: 29 mg/dL — ABNORMAL LOW (ref 41–251)

## 2013-03-02 ENCOUNTER — Telehealth (HOSPITAL_COMMUNITY): Payer: Self-pay | Admitting: Oncology

## 2013-03-02 NOTE — Telephone Encounter (Signed)
PC TO DR Beverlee Nims OFFICE ON 03/01/13 TO REQUEST A REFERRAL FOR PTS [facility]

## 2013-03-05 ENCOUNTER — Ambulatory Visit (HOSPITAL_COMMUNITY): Payer: Medicare PPO

## 2013-03-07 ENCOUNTER — Telehealth (HOSPITAL_COMMUNITY): Payer: Self-pay | Admitting: Oncology

## 2013-03-07 NOTE — Telephone Encounter (Signed)
Synetta Fail AUTH FOR 4 VISITS 03/09/13-06/07/13 [facility] [phone] F MUST CONTACT PC FOR NEW AUTH IN DEC

## 2013-03-14 ENCOUNTER — Other Ambulatory Visit (HOSPITAL_COMMUNITY): Payer: Self-pay | Admitting: Oncology

## 2013-03-14 DIAGNOSIS — F411 Generalized anxiety disorder: Secondary | ICD-10-CM

## 2013-03-14 MED ORDER — ALPRAZOLAM 0.5 MG PO TABS: ORAL_TABLET | ORAL | Status: DC

## 2013-03-16 ENCOUNTER — Ambulatory Visit (HOSPITAL_COMMUNITY)
Admission: RE | Admit: 2013-03-16 | Discharge: 2013-03-16 | Disposition: A | Discharge status: Home / Self Care | Payer: Medicare PPO | Source: Ambulatory Visit | Attending: Oncology | Admitting: Oncology

## 2013-03-16 ENCOUNTER — Ambulatory Visit (HOSPITAL_COMMUNITY): Admission: RE | Admit: 2013-03-16 | Payer: Medicare PPO | Source: Ambulatory Visit

## 2013-03-16 DIAGNOSIS — R937 Abnormal findings on diagnostic imaging of other parts of musculoskeletal system: Secondary | ICD-10-CM

## 2013-03-16 DIAGNOSIS — M47817 Spondylosis without myelopathy or radiculopathy, lumbosacral region: Secondary | ICD-10-CM | POA: Insufficient documentation

## 2013-03-16 DIAGNOSIS — M51379 Other intervertebral disc degeneration, lumbosacral region without mention of lumbar back pain or lower extremity pain: Secondary | ICD-10-CM | POA: Insufficient documentation

## 2013-03-16 DIAGNOSIS — M5380 Other specified dorsopathies, site unspecified: Secondary | ICD-10-CM | POA: Insufficient documentation

## 2013-03-16 DIAGNOSIS — M5137 Other intervertebral disc degeneration, lumbosacral region: Secondary | ICD-10-CM | POA: Insufficient documentation

## 2013-03-16 DIAGNOSIS — M5126 Other intervertebral disc displacement, lumbar region: Secondary | ICD-10-CM | POA: Insufficient documentation

## 2013-03-16 MED ORDER — GADOBENATE DIMEGLUMINE 529 MG/ML IV SOLN
20.0000 mL | Freq: Once | INTRAVENOUS | Status: AC | PRN
  Administered 2013-03-16: 20 mL via INTRAVENOUS

## 2013-04-04 ENCOUNTER — Other Ambulatory Visit: Payer: Self-pay | Admitting: Gastroenterology

## 2013-04-04 ENCOUNTER — Encounter: Payer: Self-pay | Admitting: Gastroenterology

## 2013-04-04 ENCOUNTER — Encounter (HOSPITAL_COMMUNITY): Payer: Self-pay | Admitting: Pharmacy Technician

## 2013-04-04 ENCOUNTER — Ambulatory Visit (INDEPENDENT_AMBULATORY_CARE_PROVIDER_SITE_OTHER): Payer: Medicare PPO | Admitting: Gastroenterology

## 2013-04-04 VITALS — BP 123/72 | HR 71 | Temp 97.1°F | Ht 72.0 in | Wt 217.4 lb

## 2013-04-04 DIAGNOSIS — R634 Abnormal weight loss: Secondary | ICD-10-CM

## 2013-04-04 DIAGNOSIS — Z86010 Personal history of colonic polyps: Secondary | ICD-10-CM

## 2013-04-04 DIAGNOSIS — R1013 Epigastric pain: Secondary | ICD-10-CM

## 2013-04-04 DIAGNOSIS — K3184 Gastroparesis: Secondary | ICD-10-CM | POA: Insufficient documentation

## 2013-04-04 DIAGNOSIS — R110 Nausea: Secondary | ICD-10-CM

## 2013-04-04 MED ORDER — OMEPRAZOLE 20 MG PO CPDR: DELAYED_RELEASE_CAPSULE | ORAL | Status: DC

## 2013-04-04 MED ORDER — PEG-KCL-NACL-NASULF-NA ASC-C 100 G PO SOLR: 1.0000 | ORAL | Status: DC

## 2013-04-04 NOTE — Assessment & Plan Note (Signed)
PRESENTS WITH WEIGHT LOSS, NAUSEA, ABD PAIN.  TCS FRI 13.

## 2013-04-04 NOTE — Assessment & Plan Note (Signed)
MOST LIKELY DUE TO H PYLORI GASTRITIS.  START OMEPRAZOLE BID. EGD/Bx ON FRI 13. OPV IN 3 MOS

## 2013-04-04 NOTE — Progress Notes (Signed)
cc'd to pcp 

## 2013-04-04 NOTE — Progress Notes (Signed)
Subjective:    Patient ID: Jared Gill, male    DOB: 05-16-1940, 73 y.o.   MRN: [id number] Asencion Noble, MD  HPI STOMACH BEEN BOTHERING HIM 2-3 WEEKS. FEELS LIKE IT'S JUMPING AND FEELS NAUSEATED FOR 2-3 WEEKS USU. A BIG EATER. INITIALLY HYOSCYAMINE FILLED ON JAN 21 AND NOW GIVEN BENTYL DUE TO INSURANCE COVERAGE. BMs: MOVED SAT AND MON. MOUTH DRY. A LITTLE SOB. SAW CANCER MD ABOUT HIS BACK. FEELS FUNNY JUMPING ALL AROUND HIS STOMACH BIL FLANKS TO FRONT. MAY HAVE PAIN IN LUQ. THINKS HE'S LOST WEIGHT: DEC 2013: 229, DEC 2014: 230 LBS AND HAS LOST 13 LBS. NO SMOKING-QUIT 2 YRS AGO. ETOH: LAST ONE JAN 7. USE ADVIL EVERY 3 DAYS. NO BC/GOODYS/ASA. GOT A SHOT IN RIGHT SHOULDER DUE TO PAIN. PT DENIES FEVER, CHILLS, BRBPR, CHEST PAIN, vomiting, melena, diarrhea, constipation, problems swallowing, OR heartburn or indigestion.  Past Medical History  Diagnosis Date  . Hypertension   . Abnormal MRI, spine 05/12/2011    Past Surgical History  Procedure Laterality Date  . Cholecystectomy    . Polyp removal via colonoscopy      No Known Allergies  Current Outpatient Prescriptions  Medication Sig Dispense Refill  . amLODipine (NORVASC) 5 MG tablet Take 5 mg by mouth daily.      [name] Kitchen Dexlansoprazole (DEXILANT PO) Take by mouth.      . doxazosin (CARDURA) 2 MG tablet Take 2 mg by mouth daily.      . polyethylene glycol powder (GLYCOLAX/MIRALAX) powder Take 17 g by mouth daily as needed (for constipation).       [name] Kitchen tetrahydrozoline 0.05 % ophthalmic solution Place 1 drop into both eyes daily as needed (redness relief).       No current facility-administered medications for this visit.    Family History  Problem Relation Age of Onset  . Diabetes Father   . Diabetes Sister   . Cancer Brother    History   Social History  . Marital Status: Single    Spouse Name: N/A    Number of Children: NONE  . Years of Education: N/A   Social History Main Topics  . Smoking status: Former Smoker -- 0.50  packs/day for 40 years    Types: Cigarettes  . Smokeless tobacco: Never Used     Comment: Quit x 2 years  . Alcohol Use: Yes     Comment: occasional beer  . Drug Use: No  . Sexual Activity: Not on file          Review of Systems PER HPI OTHERWISE ALL SYSTEMS ARE NEGATIVE.     Objective:   Physical Exam  Vitals reviewed. Constitutional: He is oriented to person, place, and time. He appears well-nourished. No distress.  HENT:  Head: Normocephalic and atraumatic.  Mouth/Throat: Oropharynx is clear and moist. No oropharyngeal exudate.  Eyes: Pupils are equal, round, and reactive to light. No scleral icterus.  Neck: Normal range of motion. Neck supple.  Cardiovascular: Normal rate and normal heart sounds.   Pulmonary/Chest: Effort normal and breath sounds normal. No respiratory distress.  Abdominal: Soft. Bowel sounds are normal. He exhibits no distension. There is tenderness. There is no rebound and no guarding.  MILD TTP x4  Musculoskeletal: He exhibits no edema.  Lymphadenopathy:    He has no cervical adenopathy.  Neurological: He is alert and oriented to person, place, and time.  NO FOCAL DEFICITS   Psychiatric: He has a normal mood and affect.  Assessment & Plan:

## 2013-04-04 NOTE — Assessment & Plan Note (Signed)
In the setting of abd pain and daily nausea. DIFFERENTIAL DIAGNOSIS INCLUDES  H PYLORI GASTRITIS, LESS LIKELY OCCULT MALIGNANCY(PANCREATIC OR COLON CA).  CT ABD/PELVIS ASAP. TCS/EGD FRI. PT MAY HAVE FULL LIQUID BREAKFAST. NOTHING AFTER 8 AM FRI 13. OPV IN 3 MOS

## 2013-04-04 NOTE — Patient Instructions (Addendum)
START OMEPRAZOLE. TAKE 30 MINS PRIOR TO MEALS TWICE DAILY.  STOP TAKING BENTYL.  COMPLETE CT OF ABDOMEN WITHIN NEXT 24 HRS  ENDOSCOPY ON FRIDAY. FULL LIQUID DIET UNTIL 8 AM.  FOLLOW UP IN 3 MOS.

## 2013-04-05 ENCOUNTER — Encounter (HOSPITAL_COMMUNITY): Payer: Self-pay

## 2013-04-05 ENCOUNTER — Other Ambulatory Visit: Payer: Self-pay | Admitting: Gastroenterology

## 2013-04-05 ENCOUNTER — Ambulatory Visit (HOSPITAL_COMMUNITY)
Admission: RE | Admit: 2013-04-05 | Discharge: 2013-04-05 | Disposition: A | Discharge status: Home / Self Care | Payer: Medicare PPO | Source: Ambulatory Visit | Attending: Gastroenterology | Admitting: Gastroenterology

## 2013-04-05 DIAGNOSIS — R1013 Epigastric pain: Secondary | ICD-10-CM

## 2013-04-05 DIAGNOSIS — R110 Nausea: Secondary | ICD-10-CM

## 2013-04-05 DIAGNOSIS — R634 Abnormal weight loss: Secondary | ICD-10-CM

## 2013-04-05 LAB — POCT I-STAT CREATININE: CREATININE: 1.2 mg/dL (ref 0.50–1.35)

## 2013-04-05 MED ORDER — IOHEXOL 300 MG/ML  SOLN
100.0000 mL | Freq: Once | INTRAMUSCULAR | Status: AC | PRN
  Administered 2013-04-05: 100 mL via INTRAVENOUS

## 2013-04-06 ENCOUNTER — Encounter (HOSPITAL_COMMUNITY)
Admission: RE | Disposition: A | Discharge status: Home / Self Care | Payer: Self-pay | Source: Ambulatory Visit | Attending: Gastroenterology

## 2013-04-06 ENCOUNTER — Ambulatory Visit (HOSPITAL_COMMUNITY)
Admission: RE | Admit: 2013-04-06 | Discharge: 2013-04-06 | Disposition: A | Discharge status: Home / Self Care | Payer: Medicare PPO | Source: Ambulatory Visit | Attending: Gastroenterology | Admitting: Gastroenterology

## 2013-04-06 ENCOUNTER — Encounter (HOSPITAL_COMMUNITY): Payer: Self-pay

## 2013-04-06 DIAGNOSIS — K5730 Diverticulosis of large intestine without perforation or abscess without bleeding: Secondary | ICD-10-CM | POA: Insufficient documentation

## 2013-04-06 DIAGNOSIS — Z860100 Personal history of colon polyps, unspecified: Secondary | ICD-10-CM | POA: Insufficient documentation

## 2013-04-06 DIAGNOSIS — R110 Nausea: Secondary | ICD-10-CM | POA: Insufficient documentation

## 2013-04-06 DIAGNOSIS — K624 Stenosis of anus and rectum: Secondary | ICD-10-CM

## 2013-04-06 DIAGNOSIS — K449 Diaphragmatic hernia without obstruction or gangrene: Secondary | ICD-10-CM | POA: Insufficient documentation

## 2013-04-06 DIAGNOSIS — K648 Other hemorrhoids: Secondary | ICD-10-CM | POA: Insufficient documentation

## 2013-04-06 DIAGNOSIS — Q438 Other specified congenital malformations of intestine: Secondary | ICD-10-CM | POA: Insufficient documentation

## 2013-04-06 DIAGNOSIS — I10 Essential (primary) hypertension: Secondary | ICD-10-CM | POA: Insufficient documentation

## 2013-04-06 DIAGNOSIS — K2940 Chronic atrophic gastritis without bleeding: Secondary | ICD-10-CM | POA: Insufficient documentation

## 2013-04-06 DIAGNOSIS — K5732 Diverticulitis of large intestine without perforation or abscess without bleeding: Secondary | ICD-10-CM

## 2013-04-06 DIAGNOSIS — R634 Abnormal weight loss: Secondary | ICD-10-CM | POA: Insufficient documentation

## 2013-04-06 DIAGNOSIS — R1013 Epigastric pain: Secondary | ICD-10-CM

## 2013-04-06 HISTORY — PX: COLONOSCOPY WITH ESOPHAGOGASTRODUODENOSCOPY (EGD): SHX5779

## 2013-04-06 SURGERY — COLONOSCOPY WITH ESOPHAGOGASTRODUODENOSCOPY (EGD)
Anesthesia: Moderate Sedation

## 2013-04-06 MED ORDER — MIDAZOLAM HCL 5 MG/5ML IJ SOLN
INTRAMUSCULAR | Status: AC
  Filled 2013-04-06: qty 10

## 2013-04-06 MED ORDER — STERILE WATER FOR IRRIGATION IR SOLN
Status: DC | PRN
  Administered 2013-04-06: 14:00:00

## 2013-04-06 MED ORDER — PROMETHAZINE HCL 25 MG/ML IJ SOLN
INTRAMUSCULAR | Status: DC | PRN
  Administered 2013-04-06: 12.5 mg via INTRAVENOUS

## 2013-04-06 MED ORDER — LIDOCAINE VISCOUS 2 % MT SOLN
OROMUCOSAL | Status: AC
  Filled 2013-04-06: qty 15

## 2013-04-06 MED ORDER — MEPERIDINE HCL 100 MG/ML IJ SOLN
INTRAMUSCULAR | Status: DC
Start: 2013-04-06 — End: 2013-04-06
  Filled 2013-04-06: qty 2

## 2013-04-06 MED ORDER — SODIUM CHLORIDE 0.9 % IV SOLN
INTRAVENOUS | Status: DC
  Administered 2013-04-06: 13:00:00 via INTRAVENOUS

## 2013-04-06 MED ORDER — PROMETHAZINE HCL 25 MG/ML IJ SOLN
INTRAMUSCULAR | Status: AC
  Filled 2013-04-06: qty 1

## 2013-04-06 MED ORDER — MIDAZOLAM HCL 5 MG/5ML IJ SOLN
INTRAMUSCULAR | Status: DC | PRN
  Administered 2013-04-06: 1 mg via INTRAVENOUS
  Administered 2013-04-06 (×2): 2 mg via INTRAVENOUS

## 2013-04-06 MED ORDER — MEPERIDINE HCL 100 MG/ML IJ SOLN
INTRAMUSCULAR | Status: DC | PRN
  Administered 2013-04-06: 50 mg via INTRAVENOUS
  Administered 2013-04-06 (×2): 25 mg via INTRAVENOUS

## 2013-04-06 NOTE — Op Note (Signed)
[facility] [address] [place], [address]   COLONOSCOPY PROCEDURE REPORT  PATIENT: Jared Gill  MR#: [id number] BIRTHDATE: [birth year] , 73  yrs. old GENDER: Male ENDOSCOPIST: Barney Drain, MD REFERRED BY:Roy Willey Blade, M.D. PROCEDURE DATE:  04/06/2013 PROCEDURE:   Colonoscopy, diagnostic INDICATIONS:Patient's personal history of adenomatous colon polyps. PT WAS ON A FULL LIQUID DIET. PMHx: ANXIETY, STRESS AT WORK. MEDICATIONS:  DESCRIPTION OF PROCEDURE:    Physical exam was performed.  Informed consent was obtained from the patient after explaining the benefits, risks, and alternatives to procedure.  The patient was connected to monitor and placed in left lateral position. Continuous oxygen was provided by nasal cannula and IV medicine administered through an indwelling cannula.  After administration of sedation and rectal exam, the patients rectum was intubated and the EC-[id number] ([id number])  colonoscope was advanced under direct visualization to the ileum.  The scope was removed slowly by carefully examining the color, texture, anatomy, and integrity mucosa on the way out.  The patient was recovered in endoscopy and discharged home in satisfactory condition.    COLON FINDINGS: The mucosa appeared normal in the terminal ileum.  , There was moderate diverticulosis noted in the transverse colon, descending colon, and sigmoid colon with associated muscular hypertrophy.  , The colon IS redundant.  Manual abdominal counter-pressure was used to reach the cecum. OTHERWISE NL COLON. Moderate sized internal hemorrhoids were found.  ANAL CANAL STENOSIS.  PREP QUALITY: good CECAL W/D TIME: 15 minutes     COMPLICATIONS: None  ENDOSCOPIC IMPRESSION: 1.   NO OBVIOUS SOURCE FOR WEIGHT LOSS IDENTIFIED. 2.   Moderate diverticulosis noted in the transverse colon, descending colon, and sigmoid colon 3.   The colon IS redundant 4.   Moderate sized internal  hemorrhoids 5.   PENCIL-LIKE STOOLS DUE TO ANAL CANAL STENOSIS   RECOMMENDATIONS: CONTINUE OMEPRAZOLE. AVOID ITEMS THAT TRIGGER GASTRITIS. FOLLOW A HIGH FIBER DIET.  AVOID ITEMS THAT CAUSE BLOATING. BIOPSY WILL BE BACK IN 7 DAYS. FOLLOW UP IN 3 MOS.  CONSIDER REFERRAL TO GENERAL SURGERY TO EVALUATE ANAL STENOSIS/R HERNIA OR UROLOGY IF ABD PAIN DOES NOT IMPROVE. NEEDS TCS IN 5-10 YEARS IF BENEFITS OUTWEIGH THE RISKS.       _______________________________ Lorrin MaisBarney Drain, MD 04/06/2013 6:11 PM     PATIENT NAME:  Jared Gill, Jared Gill MR#: [id number]

## 2013-04-06 NOTE — Interval H&P Note (Signed)
History and Physical Interval Note:  04/06/2013 5:44 AM  Jared Gill  has presented today for surgery, with the diagnosis of NAUSEA, ABDOMINAL PAIN, UNIINTENTIONAL WEIGHT LOSS, PERSONAL HX OF ADVANCED POLYPS  The various methods of treatment have been discussed with the patient and family. After consideration of risks, benefits and other options for treatment, the patient has consented to  Procedure(s) with comments: COLONOSCOPY WITH ESOPHAGOGASTRODUODENOSCOPY (EGD) (N/A) - 2:00 as a surgical intervention .  The patient's history has been reviewed, patient examined, no change in status, stable for surgery.  I have reviewed the patient's chart and labs.  Questions were answered to the patient's satisfaction.     [employer]

## 2013-04-06 NOTE — Op Note (Signed)
[facility] [address] [place], [address]   ENDOSCOPY PROCEDURE REPORT  PATIENT: Jared Gill, Jared Gill  MR#: [id number] BIRTHDATE: 12-28-1940 , 73  yrs. old GENDER: Male  ENDOSCOPIST: Barney Drain, MD REFERRED BY:Roy Willey Blade, M.D.  PROCEDURE DATE: 04/06/2013 PROCEDURE:   EGD w/ biopsy  INDICATIONS:Nausea.   Weight loss: 13 LBS SINCE DEC 2014. I PERSONALLY REVIEWED CT WITH DR. Kathryne Gin LIKELY UNDERDISTENDED, R INGUINAL HERNIA, MILDY ENLARGED PROSTATE. PMHx: ANXIETY, STRESS AT WORK MEDICATIONS: Demerol 100 mg IV, Versed 5 mg IV, and Promethazine (Phenergan) 12.5mg  IV TOPICAL ANESTHETIC:   Viscous Xylocaine  DESCRIPTION OF PROCEDURE:     Physical exam was performed.  Informed consent was obtained from the patient after explaining the benefits, risks, and alternatives to the procedure.  The patient was connected to the monitor and placed in the left lateral position.  Continuous oxygen was provided by nasal cannula and IV medicine administered through an indwelling cannula.  After administration of sedation, the patients esophagus was intubated and the [id number] ([id number])  endoscope was advanced under direct visualization to the second portion of the duodenum.  The scope was removed slowly by carefully examining the color, texture, anatomy, and integrity of the mucosa on the way out.  The patient was recovered in endoscopy and discharged home in satisfactory condition.   ESOPHAGUS: The mucosa of the esophagus appeared normal.   A medium sized hiatal hernia was noted.   STOMACH: Moderate erosive gastritis (inflammation) was found on the lesser curvature of the gastric body and in the gastric antrum.  Multiple biopsies were performed using cold forceps.  Multiple biopsies were performed using cold forceps.   DUODENUM: The duodenal mucosa showed no abnormalities in the bulb and second portion of the duodenum. COMPLICATIONS:   None  ENDOSCOPIC IMPRESSION: 1.    Medium sized hiatal hernia 2.   NAUSEA DUE TO MODERATE Erosive gastritis  RECOMMENDATIONS: CONTINUE OMEPRAZOLE. AVOID ITEMS THAT TRIGGER GASTRITIS. FOLLOW A HIGH FIBER DIET.  AVOID ITEMS THAT CAUSE BLOATING. BIOPSY WILL BE BACK IN 7 DAYS.  FOLLOW UP IN 3 MOS. CONSIDER REFERRAL TO GENERAL SURGERY OR UROLOGY IF LOWER ABD PAIN DOES NOT IMPROVE.   REPEAT EXAM:   _______________________________ Lorrin MaisBarney Drain, MD 04/06/2013 6:02 PM       PATIENT NAME:  Jared Gill MR#: [id number]

## 2013-04-06 NOTE — H&P (View-Only) (Signed)
Subjective:    Patient ID: Jared Gill, male    DOB: 1940/06/20, 73 y.o.   MRN: [id number] Asencion Noble, MD  HPI STOMACH BEEN BOTHERING HIM 2-3 WEEKS. FEELS LIKE IT'S JUMPING AND FEELS NAUSEATED FOR 2-3 WEEKS USU. A BIG EATER. INITIALLY HYOSCYAMINE FILLED ON JAN 21 AND NOW GIVEN BENTYL DUE TO INSURANCE COVERAGE. BMs: MOVED SAT AND MON. MOUTH DRY. A LITTLE SOB. SAW CANCER MD ABOUT HIS BACK. FEELS FUNNY JUMPING ALL AROUND HIS STOMACH BIL FLANKS TO FRONT. MAY HAVE PAIN IN LUQ. THINKS HE'S LOST WEIGHT: DEC 2013: 229, DEC 2014: 230 LBS AND HAS LOST 13 LBS. NO SMOKING-QUIT 2 YRS AGO. ETOH: LAST ONE JAN 7. USE ADVIL EVERY 3 DAYS. NO BC/GOODYS/ASA. GOT A SHOT IN RIGHT SHOULDER DUE TO PAIN. PT DENIES FEVER, CHILLS, BRBPR, CHEST PAIN, vomiting, melena, diarrhea, constipation, problems swallowing, OR heartburn or indigestion.  Past Medical History  Diagnosis Date  . Hypertension   . Abnormal MRI, spine 05/12/2011    Past Surgical History  Procedure Laterality Date  . Cholecystectomy    . Polyp removal via colonoscopy      No Known Allergies  Current Outpatient Prescriptions  Medication Sig Dispense Refill  . amLODipine (NORVASC) 5 MG tablet Take 5 mg by mouth daily.      [name] Kitchen Dexlansoprazole (DEXILANT PO) Take by mouth.      . doxazosin (CARDURA) 2 MG tablet Take 2 mg by mouth daily.      . polyethylene glycol powder (GLYCOLAX/MIRALAX) powder Take 17 g by mouth daily as needed (for constipation).       [name] Kitchen tetrahydrozoline 0.05 % ophthalmic solution Place 1 drop into both eyes daily as needed (redness relief).       No current facility-administered medications for this visit.    Family History  Problem Relation Age of Onset  . Diabetes Father   . Diabetes Sister   . Cancer Brother    History   Social History  . Marital Status: Single    Spouse Name: N/A    Number of Children: NONE  . Years of Education: N/A   Social History Main Topics  . Smoking status: Former Smoker -- 0.50  packs/day for 40 years    Types: Cigarettes  . Smokeless tobacco: Never Used     Comment: Quit x 2 years  . Alcohol Use: Yes     Comment: occasional beer  . Drug Use: No  . Sexual Activity: Not on file          Review of Systems PER HPI OTHERWISE ALL SYSTEMS ARE NEGATIVE.     Objective:   Physical Exam  Vitals reviewed. Constitutional: He is oriented to person, place, and time. He appears well-nourished. No distress.  HENT:  Head: Normocephalic and atraumatic.  Mouth/Throat: Oropharynx is clear and moist. No oropharyngeal exudate.  Eyes: Pupils are equal, round, and reactive to light. No scleral icterus.  Neck: Normal range of motion. Neck supple.  Cardiovascular: Normal rate and normal heart sounds.   Pulmonary/Chest: Effort normal and breath sounds normal. No respiratory distress.  Abdominal: Soft. Bowel sounds are normal. He exhibits no distension. There is tenderness. There is no rebound and no guarding.  MILD TTP x4  Musculoskeletal: He exhibits no edema.  Lymphadenopathy:    He has no cervical adenopathy.  Neurological: He is alert and oriented to person, place, and time.  NO FOCAL DEFICITS   Psychiatric: He has a normal mood and affect.  Assessment & Plan:

## 2013-04-06 NOTE — Discharge Instructions (Signed)
YOU DO NOT HAVE CANCER. 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 THE CT SHOWED A SMALL HERNIA ON THE RIGHT & A SLIGHTLY ENLARGED PROSTATE. IF YOUR LOWER ABD PAIN CONTINUES YOU SHOULD SEE A SURGEON OR A PROSTATE SPECIALIST(UROLOGY). YOU DID NOT HAVE ANY POLYPS. YOUR NAUSEA IS MOST LIKELY DUE TO EROSIVE GASTRITIS FROM ADVIL. 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 You have internal hemorrhoids & DIVERTICULOSIS IN YOUR RIGHT AND LEFT COLON.  I BIOPSIED YOUR STOMACH.    CONTINUE OMEPRAZOLE.  AVOID ITEMS THAT TRIGGER GASTRITIS. SEE INFO BELOW  FOLLOW A HIGH FIBER DIET. AVOID ITEMS THAT CAUSE BLOATING. SEE INFO BELOW.  YOUR BIOPSY WILL BE BACK IN 7 DAYS.  FOLLOW UP IN 3 MOS.   ENDOSCOPY Care After Read the instructions outlined below and refer to this sheet in the next week. These discharge instructions provide you with general information on caring for yourself after you leave the hospital. While your treatment has been planned according to the most current medical practices available, unavoidable complications occasionally occur. If you have any problems or questions after discharge, call DR. Murrell Elizondo, [phone].  ACTIVITY  You may resume your regular activity, but move at a slower pace for the next 24 hours.   Take frequent rest periods for the next 24 hours.   Walking will help get rid of the air and reduce the bloated feeling in your belly (abdomen).   No driving for 24 hours (because of the medicine (anesthesia) used during the test).   You may shower.   Do not sign any important legal documents or operate any machinery for 24 hours (because of the anesthesia used during the test).    NUTRITION  Drink plenty of fluids.   You may resume your normal diet as instructed by your doctor.   Begin with a light meal and progress to your normal diet. Heavy or fried foods are harder to digest and may make you feel sick to your stomach (nauseated).   Avoid alcoholic  beverages for 24 hours or as instructed.    MEDICATIONS  You may resume your normal medications.   WHAT YOU CAN EXPECT TODAY  Some feelings of bloating in the abdomen.   Passage of more gas than usual.   Spotting of blood in your stool or on the toilet paper  .  IF YOU HAD POLYPS REMOVED DURING THE ENDOSCOPY:  Eat a soft diet IF YOU HAVE NAUSEA, BLOATING, ABDOMINAL PAIN, OR VOMITING.    FINDING OUT THE RESULTS OF YOUR TEST Not all test results are available during your visit. DR. Oneida Alar WILL CALL YOU WITHIN 7 DAYS OF YOUR PROCEDUE WITH YOUR RESULTS. Do not assume everything is normal if you have not heard from DR. Kamla Skilton IN ONE WEEK, CALL HER OFFICE AT [phone].  SEEK IMMEDIATE MEDICAL ATTENTION AND CALL THE OFFICE: [phone] IF:  You have more than a spotting of blood in your stool.   Your belly is swollen (abdominal distention).   You are nauseated or vomiting.   You have a temperature over 101F.   You have abdominal pain or discomfort that is severe or gets worse throughout the day.   Gastritis  Gastritis is an inflammation (the body's way of reacting to injury and/or infection) of the stomach. It is often caused by bacterial (germ) infections. It can also be caused BY ASPIRIN, BC/GOODY POWDER'S, (IBUPROFEN) MOTRIN/ADVIL, OR ALEVE (NAPROXEN), chemicals (including alcohol), SPICY FOODS, and medications. This illness may be associated with generalized  malaise (feeling tired, not well), UPPER ABDOMINAL STOMACH cramps, and fever. One common bacterial cause of gastritis is an organism known as H. Pylori. This can be treated with antibiotics.    High-Fiber Diet A high-fiber diet changes your normal diet to include more whole grains, legumes, fruits, and vegetables. Changes in the diet involve replacing refined carbohydrates with unrefined foods. The calorie level of the diet is essentially unchanged. The Dietary Reference Intake (recommended amount) for adult males is  38 grams per day. For adult females, it is 25 grams per day. Pregnant and lactating women should consume 28 grams of fiber per day. Fiber is the intact part of a plant that is not broken down during digestion. Functional fiber is fiber that has been isolated from the plant to provide a beneficial effect in the body. PURPOSE  Increase stool bulk.   Ease and regulate bowel movements.   Lower cholesterol.  INDICATIONS THAT YOU NEED MORE FIBER  Constipation and hemorrhoids.   Uncomplicated diverticulosis (intestine condition) and irritable bowel syndrome.   Weight management.   As a protective measure against hardening of the arteries (atherosclerosis), diabetes, and cancer.   DO NOT USE WITH:  Acute diverticulitis (intestine infection).   Partial small bowel obstructions.   Complicated diverticular disease involving bleeding, rupture (perforation), or abscess (boil, furuncle).   Presence of autonomic neuropathy (nerve damage) or gastroparesis (stomach cannot empty itself).    GUIDELINES FOR INCREASING FIBER IN THE DIET  Start adding fiber to the diet slowly. A gradual increase of about 5 more grams (2 slices of whole-wheat bread, 2 servings of most fruits or vegetables, or 1 bowl of high-fiber cereal) per day is best. Too rapid an increase in fiber may result in constipation, flatulence, and bloating.   Drink enough water and fluids to keep your urine clear or pale yellow. Water, juice, or caffeine-free drinks are recommended. Not drinking enough fluid may cause constipation.   Eat a variety of high-fiber foods rather than one type of fiber.   Try to increase your intake of fiber through using high-fiber foods rather than fiber pills or supplements that contain small amounts of fiber.   The goal is to change the types of food eaten. Do not supplement your present diet with high-fiber foods, but replace foods in your present diet.    INCLUDE A VARIETY OF FIBER  SOURCES  Replace refined and processed grains with whole grains, canned fruits with fresh fruits, and incorporate other fiber sources. White rice, white breads, and most bakery goods contain little or no fiber.   Brown whole-grain rice, buckwheat oats, and many fruits and vegetables are all good sources of fiber. These include: broccoli, Brussels sprouts, cabbage, cauliflower, beets, sweet potatoes, white potatoes (skin on), carrots, tomatoes, eggplant, squash, berries, fresh fruits, and dried fruits.   Cereals appear to be the richest source of fiber. Cereal fiber is found in whole grains and bran. Bran is the fiber-rich outer coat of cereal grain, which is largely removed in refining. In whole-grain cereals, the bran remains. In breakfast cereals, the largest amount of fiber is found in those with "bran" in their names. The fiber content is sometimes indicated on the label.   You may need to include additional fruits and vegetables each day.   In baking, for 1 cup white flour, you may use the following substitutions:   1 cup whole-wheat flour minus 2 tablespoons.   1/2 cup white flour plus 1/2 cup whole-wheat flour.   Hemorrhoids  Hemorrhoids are dilated (enlarged) veins around the rectum. Sometimes clots will form in the veins. This makes them swollen and painful. These are called thrombosed hemorrhoids. Causes of hemorrhoids include:  Constipation.   Straining to have a bowel movement.   HEAVY LIFTING HOME CARE INSTRUCTIONS  Eat a well balanced diet and drink 6 to 8 glasses of water every day to avoid constipation. You may also use a bulk laxative.   Avoid straining to have bowel movements.   Keep anal area dry and clean.   Do not use a donut shaped pillow or sit on the toilet for long periods. This increases blood pooling and pain.   Move your bowels when your body has the urge; this will require less straining and will decrease pain and pressure. =

## 2013-04-09 ENCOUNTER — Telehealth: Payer: Self-pay | Admitting: Gastroenterology

## 2013-04-09 NOTE — Telephone Encounter (Signed)
[facility] PAC # FOR TCS & EGD IS [id number]

## 2013-04-12 ENCOUNTER — Encounter (HOSPITAL_COMMUNITY): Payer: Self-pay | Admitting: Gastroenterology

## 2013-04-14 ENCOUNTER — Telehealth: Payer: Self-pay | Admitting: Gastroenterology

## 2013-04-14 NOTE — Telephone Encounter (Signed)
Please call pt. His stomach Bx shows mild gastritis. HIS NAUSEA IS MOST LIKELY DUE TO EROSIVE GASTRITIS FROM ADVIL. CONTINUE OMEPRAZOLE. STOP USING ADVIL FOR ONE MONTH.  HIS STOOLS ARE PENCIL-LIKE BECAUSE HE HAS ANAL STENOSIS. THE CT SHOWED A SMALL HERNIA ON THE RIGHT & A SLIGHTLY ENLARGED PROSTATE. IF HIS LOWER ABD PAIN CONTINUES HESHOULD SEE A SURGEON TO EVALUATE HIS ANAL STENOSIS AND HERNIA OR HE SHOULD SEE A PROSTATE SPECIALIST(UROLOGY).   OPV IN 3 MOS E30 ABD PAIN/NAUSEA/WEIGHT LOSS

## 2013-04-16 NOTE — Telephone Encounter (Signed)
Pt returned call and was informed. He said he is not having any pain now. He is going on vacation next week. He will get in touch with [place] when he returns if he chooses to see a Psychologist, sport and exercise.

## 2013-04-16 NOTE — Telephone Encounter (Signed)
LMOM to call.

## 2013-04-17 NOTE — Telephone Encounter (Signed)
Reminder in epic °

## 2013-04-17 NOTE — Telephone Encounter (Signed)
REVIEWED.  

## 2013-04-18 ENCOUNTER — Other Ambulatory Visit: Payer: Self-pay | Admitting: Gastroenterology

## 2013-04-18 DIAGNOSIS — R1031 Right lower quadrant pain: Secondary | ICD-10-CM

## 2013-04-18 DIAGNOSIS — R1032 Left lower quadrant pain: Secondary | ICD-10-CM

## 2013-04-18 DIAGNOSIS — R110 Nausea: Secondary | ICD-10-CM

## 2013-04-18 DIAGNOSIS — G8929 Other chronic pain: Secondary | ICD-10-CM

## 2013-04-18 NOTE — Progress Notes (Signed)
Reminder in epic °

## 2013-04-18 NOTE — Telephone Encounter (Signed)
Scheduled with Dr. Diona Fanti on March 10th at 8:30

## 2013-04-18 NOTE — Telephone Encounter (Signed)
REFER TO [facility] IN [place] [facility].

## 2013-04-18 NOTE — Telephone Encounter (Signed)
Pt came by the office this AM and has decided since he will have days off from work next week, he would like to see urologist. He has seen the one in the building with Randell Loop ( he could not remember the name) and that is where he would like appt made for him and do so next week if at all possible.

## 2013-05-01 ENCOUNTER — Ambulatory Visit (INDEPENDENT_AMBULATORY_CARE_PROVIDER_SITE_OTHER): Payer: Medicare HMO | Admitting: Urology

## 2013-05-01 DIAGNOSIS — R110 Nausea: Secondary | ICD-10-CM

## 2013-05-01 DIAGNOSIS — Z125 Encounter for screening for malignant neoplasm of prostate: Secondary | ICD-10-CM

## 2013-05-10 ENCOUNTER — Telehealth: Payer: Self-pay

## 2013-05-10 MED ORDER — OMEPRAZOLE 20 MG PO CPDR: 20.0000 mg | DELAYED_RELEASE_CAPSULE | Freq: Two times a day (BID) | ORAL | Status: DC

## 2013-05-10 NOTE — Telephone Encounter (Signed)
Completed.

## 2013-05-10 NOTE — Telephone Encounter (Signed)
Pt came by the office and he has had a change in his insurance and needs his Omeprazole prescription sent to [facility].  I have entered that pharmacy in the computer.  It was prescribed :  Omeprazole 20 mg                                   One capsule PO bid 3o min before meals for 3 months and and then once daily.

## 2013-05-14 NOTE — Telephone Encounter (Signed)
Rx did not go electronically. I faxed to [phone].

## 2013-08-11 ENCOUNTER — Other Ambulatory Visit: Payer: Self-pay

## 2013-08-13 ENCOUNTER — Other Ambulatory Visit: Payer: Self-pay | Admitting: *Deleted

## 2013-08-14 ENCOUNTER — Telehealth: Payer: Self-pay

## 2013-08-14 ENCOUNTER — Other Ambulatory Visit: Payer: Self-pay | Admitting: Gastroenterology

## 2013-08-14 MED ORDER — OMEPRAZOLE 20 MG PO CPDR
20.0000 mg | DELAYED_RELEASE_CAPSULE | Freq: Every day | ORAL | Status: DC
Start: ? — End: 2013-08-14

## 2013-08-14 MED ORDER — OMEPRAZOLE 20 MG PO CPDR: 20.0000 mg | DELAYED_RELEASE_CAPSULE | Freq: Every day | ORAL | Status: DC

## 2013-08-14 NOTE — Telephone Encounter (Signed)
Jared Gill will send the Rx electronically.

## 2013-08-14 NOTE — Telephone Encounter (Signed)
Rx printed for the omeprazole by Dr. Oneida Alar. I called and LMOM for a return call to let me know which pharmacy.

## 2013-08-21 ENCOUNTER — Other Ambulatory Visit: Payer: Self-pay | Admitting: *Deleted

## 2013-08-21 MED ORDER — OMEPRAZOLE 20 MG PO CPDR: 20.0000 mg | DELAYED_RELEASE_CAPSULE | Freq: Every day | ORAL | Status: DC

## 2013-10-31 ENCOUNTER — Encounter: Payer: Self-pay | Admitting: Internal Medicine

## 2014-01-22 DIAGNOSIS — R110 Nausea: Secondary | ICD-10-CM

## 2014-01-22 HISTORY — DX: Nausea: R11.0

## 2014-02-04 NOTE — Progress Notes (Signed)
Jared Noble, MD [address] [place] [address]  Abnormal MRI, spine  CURRENT THERAPY: Observation  INTERVAL HISTORY: Jared Gill 73 y.o. male returns for  regular  visit for followup of abnormal MRI of spine in November 2011 after falling and having acute back pain. Bone scan on 01/12/2010 is negative for any disease. Jared Gill is reluctant to have follow-up MRI due to cost. Lab work is unimpressive for any abnormalities and no M-spike identified. Patient was referred to [place] by Dr. Willey Blade in 2011.  I personally reviewed and went over laboratory results with the patient.  The results are noted within this dictation.  I personally reviewed and went over radiographic studies with the patient.  The results are noted within this dictation.  MRI of L spine in Jan 2015 demonstrated:  1. Mildly progressive lumbar spondylosis and degenerative disc disease, resulting in moderate to prominent impingement at L5-S1, and mild impingement at L3-4 and L4-5 as detailed above. 2. Marrow heterogeneity is not significantly changed from prior. Although this can be caused by marrow infiltrative processes, the stability suggests a benign cause such as anemia, smoking, obesity, or advancing age.  With a negative workup for malignancy and stable findings over the past few years, we will release the patient from the clinic.  He notes some constipation in the recommend a our regimen including stool softeners with the addition of Jared Gill laxatives if needed. He is status post colonoscopy this year by Dr. Oneida Alar and that was negative for any acute findings. He's also had an EGD which 2 was negative. Reports a sudden onset of nausea for 1 week and he is following Dr. Willey Blade for that. I recommended he continue follow-up with Dr. Willey Blade  Hematologically and oncologically, the patient denies any complaints and review of systems is negative.  Past Medical History  Diagnosis Date  . Hypertension    . Abnormal MRI, spine 05/12/2011  . Nausea 01/2014    has ANXIETY; HYPERTENSION; GERD; COLONIC POLYPS, HX OF; Abnormal MRI, spine; Lipoma of axilla; Unintentional weight loss of more than 10 pounds; and Nausea alone on his problem list.     has no allergies on file.  Jared Gill does not currently have medications on file.  Past Surgical History  Procedure Laterality Date  . Cholecystectomy    . Polyp removal via colonoscopy    . Colonoscopy with esophagogastroduodenoscopy (egd) N/A 04/06/2013    Procedure: COLONOSCOPY WITH ESOPHAGOGASTRODUODENOSCOPY (EGD);  Surgeon: Danie Binder, MD;  Location: [facility];  Service: Endoscopy;  Laterality: N/A;  2:00    Denies any headaches, dizziness, double vision, fevers, chills, night sweats, vomiting, diarrhea, chest pain, heart palpitations, shortness of breath, blood in stool, black tarry stool, urinary pain, urinary burning, urinary frequency, hematuria.   PHYSICAL EXAMINATION  ECOG PERFORMANCE STATUS: 1 - Symptomatic but completely ambulatory  Filed Vitals:   02/07/14 0952  BP: 108/88  Pulse: 70  Temp: 97.8 F (36.6 C)  Resp: 18    GENERAL:alert, no distress, well nourished, well developed, comfortable and cooperative SKIN: skin color, texture, turgor are normal, no rashes or significant lesions HEAD: Normocephalic, No masses, lesions, tenderness or abnormalities EYES: normal, PERRLA, EOMI, Conjunctiva are pink and non-injected EARS: External ears normal OROPHARYNX:lips, buccal mucosa, and tongue normal and mucous membranes are moist  NECK: supple, no adenopathy, thyroid normal size, non-tender, without nodularity, no stridor, non-tender, trachea midline LYMPH:  no palpable lymphadenopathy BREAST:not examined LUNGS: clear to auscultation  HEART: regular rate & rhythm, no murmurs and no gallops ABDOMEN:abdomen soft and normal bowel sounds BACK: Back symmetric, no curvature. EXTREMITIES:less then 2 second capillary refill, no  joint deformities, effusion, or inflammation, no skin discoloration, no cyanosis  NEURO: alert & oriented x 3 with fluent speech, no focal motor/sensory deficits, gait normal    LABORATORY DATA: CBC    Component Value Date/Time   WBC 5.0 02/07/2013 0943   RBC 5.22 02/07/2013 0943   HGB 12.3* 02/07/2013 0943   HCT 37.9* 02/07/2013 0943   PLT 210 02/07/2013 0943   MCV 72.6* 02/07/2013 0943   MCH 23.6* 02/07/2013 0943   MCHC 32.5 02/07/2013 0943   RDW 16.1* 02/07/2013 0943   LYMPHSABS 1.0 02/07/2013 0943   MONOABS 0.6 02/07/2013 0943   EOSABS 0.2 02/07/2013 0943   BASOSABS 0.0 02/07/2013 0943      Chemistry      Component Value Date/Time   NA 138 02/07/2013 0943   K 3.7 02/07/2013 0943   CL 104 02/07/2013 0943   CO2 23 02/07/2013 0943   BUN 14 02/07/2013 0943   CREATININE 1.20 04/05/2013 1533      Component Value Date/Time   CALCIUM 9.2 02/07/2013 0943   ALKPHOS 54 02/07/2013 0943   AST 28 02/07/2013 0943   ALT 19 02/07/2013 0943   BILITOT 0.3 02/07/2013 0943        RADIOGRAPHIC STUDIES:  03/16/2013  CLINICAL DATA: Marrow heterogeneity. Lumbar degenerative disc disease is.  EXAM: MRI LUMBAR SPINE WITHOUT AND WITH CONTRAST  TECHNIQUE: Multiplanar and multiecho pulse sequences of the lumbar spine were obtained without and with intravenous contrast.  CONTRAST: 40m MULTIHANCE GADOBENATE DIMEGLUMINE 529 MG/ML IV SOLN  COMPARISON: MR L SPINE W/O dated 01/06/2010; NM BONE WHOLE BODY dated 01/12/2010; DG BONE SURVEY MET dated 02/06/2010  FINDINGS: The lowest lumbar type non-rib-bearing vertebra is labeled as L5. The conus medullaris appears normal. Conus level: L1.  Heterogeneous vertebral marrow is again identified with high T2 and low T1 signal geographically in the L1 and S1 vertebra, and in a patchy distribution elsewhere in the lower thoracic and lumbar spine. Given the lack of progression, this probably reflects benign marrow  heterogeneity.  There is 4 mm of degenerative posterior subluxation at L5-S1 with loss of disc height at L5-S1 and disc desiccation at all levels between L2 and S1.  Additional findings at individual levels are as follows:  L2-3: Borderline central stenosis due to diffuse disc bulge. Bilateral facet arthropathy. Disc bulge abuts but does not significantly displace the right L2 nerve in the lateral extraforaminal space.  L3-4: Mild to moderate left foraminal stenosis due to left foraminal disc protrusion, intervertebral spurring, and facet arthropathy. Underlying disc bulge noted.  L4-5: Mild bilateral foraminal stenosis due to disc bulge, intervertebral spurring, and facet arthropathy.  L5-S1: Moderate to prominent right and moderate left foraminal stenosis with mild left subarticular lateral recess stenosis due to facet arthropathy, subluxation, disc bulge, and right inferior foraminal disc protrusion.  IMPRESSION: 1. Mildly progressive lumbar spondylosis and degenerative disc disease, resulting in moderate to prominent impingement at L5-S1, and mild impingement at L3-4 and L4-5 as detailed above. 2. Marrow heterogeneity is not significantly changed from prior. Although this can be caused by marrow infiltrative processes, the stability suggests a benign cause such as anemia, smoking, obesity, or advancing age.   Electronically Signed  By: WSherryl BartersM.D.  On: 03/16/2013 17:51    ASSESSMENT:  1. Abnormal MRI of spine in November 2011  after falling and having acute back pain. Bone scan on 01/12/2010 is negative for any disease. Amadeo is reluctant to have follow-up MRI due to cost. Lab work is unimpressive for any abnormalities and no M-spike identified. 2. Microcytic anemia, stable 3. Nausea x 1 week, on Compazine with response, followed by primary care provider.  4. Constipation, may be cause of #3.  Patient Active Problem List   Diagnosis Date  Noted  . Unintentional weight loss of more than 10 pounds 04/04/2013  . Nausea alone 04/04/2013  . Lipoma of axilla 11/24/2012  . Abnormal MRI, spine 05/12/2011  . COLONIC POLYPS, HX OF 11/06/2008  . ANXIETY 10/31/2008  . HYPERTENSION 10/31/2008  . GERD 10/31/2008     PLAN:  1. I personally reviewed and went over laboratory results with the patient.  The results are noted within this dictation. 2. I personally reviewed and went over radiographic studies with the patient.  The results are noted within this dictation.   3. Patient education regarding bowel regimen consisting of stool softener with the addition of Jared Gill laxatives needed. Recommend follow-up with primary care physician. 4. Continue Compazine for nausea control. Follow with Dr. Willey Blade regarding this as well. 5. Release from clinic after 4 years of follow-up without any indication of malignancy.   THERAPY PLAN:  Release from clinic follow with primary care physician. No evidence of malignancy  All questions were answered. The patient knows to call the with any problems, questions or concerns. We can certainly see the patient much sooner if necessary.  Patient and plan discussed with Dr. Farrel Gobble and he is in agreement with the aforementioned.   KEFALAS,THOMAS 02/07/2014

## 2014-02-07 ENCOUNTER — Encounter (HOSPITAL_COMMUNITY): Payer: Commercial Managed Care - HMO | Attending: Oncology | Admitting: Oncology

## 2014-02-07 ENCOUNTER — Encounter (HOSPITAL_COMMUNITY): Payer: Self-pay | Admitting: Oncology

## 2014-02-07 ENCOUNTER — Telehealth (HOSPITAL_COMMUNITY): Payer: Self-pay | Admitting: Hematology and Oncology

## 2014-02-07 VITALS — BP 108/88 | HR 70 | Temp 97.8°F | Resp 18 | Wt 216.0 lb

## 2014-02-07 DIAGNOSIS — K5900 Constipation, unspecified: Secondary | ICD-10-CM

## 2014-02-07 DIAGNOSIS — R937 Abnormal findings on diagnostic imaging of other parts of musculoskeletal system: Secondary | ICD-10-CM

## 2014-02-07 NOTE — Telephone Encounter (Signed)
PC TO DR Beverlee Nims OV SPOKE TO DANIELLE RE: REF TO SEE DR F. REF WAS APPROVED AUTH# [id number]

## 2014-02-07 NOTE — Patient Instructions (Signed)
[facility] Discharge Instructions  RECOMMENDATIONS MADE BY THE CONSULTANT AND ANY TEST RESULTS WILL BE SENT TO YOUR REFERRING PHYSICIAN.  Your imaging studies are reviewed and are benign without any indication for malignancy. Your lab work is negative and there are no signs of cancer.  We will release you from the clinic since there are no indications of cancer.  You should follow-up with Dr. Willey Blade as directed, sooner if needed.   We would be glad to see you back if Dr. Willey Blade refers you to [place].   Merry Christmas and Happy New Years.  Thank you for choosing [facility] to provide your oncology and hematology care.  To afford each patient quality time with our providers, please arrive at least 15 minutes before your scheduled appointment time.  With your help, our goal is to use those 15 minutes to complete the necessary work-up to ensure our physicians have the information they need to help with your evaluation and healthcare recommendations.    Effective January 1st, 2014, we ask that you re-schedule your appointment with our physicians should you arrive 10 or more minutes late for your appointment.  We strive to give you quality time with our providers, and arriving late affects you and other patients whose appointments are after yours.    Again, thank you for choosing [facility].  Our hope is that these requests will decrease the amount of time that you wait before being seen by our physicians.       _____________________________________________________________  Should you have questions after your visit to [facility], please contact our office at [phone] between the hours of 8:30 a.m. and 5:00 p.m.  Voicemails left after 4:30 p.m. will not be returned until the following business day.  For prescription refill requests, have your pharmacy contact our office with your prescription refill request.

## 2014-02-08 ENCOUNTER — Telehealth: Payer: Self-pay | Admitting: Gastroenterology

## 2014-02-08 NOTE — Telephone Encounter (Signed)
Patient came to front office asking to be seen today. I told him we were booked up to early February and we only work half days on Friday and SF nurse was out this week and the other nurses were busy with patients being seen. I asked him what was wrong and he says he stays nauseated all the time. I asked if he has seen his PCP and he said he had seen Dr Willey Blade a few days ago and he gave him a prescription. I told him that he should call with Dr Willey Blade and follow up with him if the medicine that he prescribed weren't helping him. Patient is still wanting to be seen. I told her we are scheduled out to the first of February. Please call [phone] and advise if this patient needs to be seen sooner than February.

## 2014-02-11 NOTE — Telephone Encounter (Signed)
I called pt and he said Dr. Willey Blade gave him some medication for nausea and he is feeling much better. He will call if he needs anything.

## 2014-02-12 ENCOUNTER — Encounter: Payer: Self-pay | Admitting: Gastroenterology

## 2014-02-12 NOTE — Telephone Encounter (Signed)
REVIEWED. OPV IN FEB/MAR E30 NAUSEA.

## 2014-02-12 NOTE — Telephone Encounter (Signed)
APPOINTMENT MADE AND LETTER SENT °

## 2014-02-18 ENCOUNTER — Telehealth: Payer: Self-pay | Admitting: Gastroenterology

## 2014-02-18 NOTE — Telephone Encounter (Signed)
Please call patient, he has appointment in January and is stating he can not wait that long.  Can not eat anything.

## 2014-02-18 NOTE — Telephone Encounter (Signed)
Agree 

## 2014-02-18 NOTE — Telephone Encounter (Signed)
I also told him if he cannot see Dr. Willey Blade today, and he feels that he worsens to go to the ED and he said that he would.

## 2014-02-18 NOTE — Telephone Encounter (Signed)
Pt had come by the office. I talked to him and he said he does not have an appetite. He can hardly stand to look at food. He is eating very little other than crackers and ginger ale, and he is getting in  liquids. He was somewhat jittery. I told him to go by Dr. Ria Comment and see what he would suggest since we do not have coverage at the office today. Dr. Willey Blade had given him Dicyclomine to take bid and he said it helped at first but now is not helping. He complains of losing weight. I weighed him and he is at 207 lbs today. His last note at Oncology on 02/07/2014 he weighed 216. ( 9 lb weight loss).  I have scheduled an Urgent OV with Neil Crouch, PA for 02/21/2014 at 11:30 AM.  I told him if he see's Dr. Willey Blade today and doesn't need the appt Thurs to call us, but he said he wants to keep that appt for GI assessment.  He is scheduled to see Dr. Oneida Alar in Feb 2016.

## 2014-02-21 ENCOUNTER — Other Ambulatory Visit: Payer: Self-pay

## 2014-02-21 ENCOUNTER — Telehealth: Payer: Self-pay

## 2014-02-21 ENCOUNTER — Encounter: Payer: Self-pay | Admitting: Gastroenterology

## 2014-02-21 ENCOUNTER — Ambulatory Visit (INDEPENDENT_AMBULATORY_CARE_PROVIDER_SITE_OTHER): Payer: Commercial Managed Care - HMO | Admitting: Gastroenterology

## 2014-02-21 VITALS — BP 117/68 | HR 78 | Temp 98.3°F | Ht 69.0 in | Wt 208.4 lb

## 2014-02-21 DIAGNOSIS — K5900 Constipation, unspecified: Secondary | ICD-10-CM

## 2014-02-21 DIAGNOSIS — R634 Abnormal weight loss: Secondary | ICD-10-CM

## 2014-02-21 DIAGNOSIS — R1012 Left upper quadrant pain: Secondary | ICD-10-CM

## 2014-02-21 DIAGNOSIS — D509 Iron deficiency anemia, unspecified: Secondary | ICD-10-CM

## 2014-02-21 DIAGNOSIS — R110 Nausea: Secondary | ICD-10-CM

## 2014-02-21 DIAGNOSIS — K5909 Other constipation: Secondary | ICD-10-CM

## 2014-02-21 DIAGNOSIS — R1013 Epigastric pain: Secondary | ICD-10-CM

## 2014-02-21 LAB — IRON AND TIBC
%SAT: 17 % — ABNORMAL LOW (ref 20–55)
IRON: 37 ug/dL — AB (ref 42–165)
TIBC: 218 ug/dL (ref 215–435)
UIBC: 181 ug/dL (ref 125–400)

## 2014-02-21 LAB — COMPREHENSIVE METABOLIC PANEL
ALK PHOS: 63 U/L (ref 39–117)
ALT: 8 U/L (ref 0–53)
AST: 12 U/L (ref 0–37)
Albumin: 4.2 g/dL (ref 3.5–5.2)
BUN: 7 mg/dL (ref 6–23)
CO2: 27 meq/L (ref 19–32)
Calcium: 9.5 mg/dL (ref 8.4–10.5)
Chloride: 105 mEq/L (ref 96–112)
Creat: 1 mg/dL (ref 0.50–1.35)
GLUCOSE: 93 mg/dL (ref 70–99)
POTASSIUM: 4.4 meq/L (ref 3.5–5.3)
Sodium: 140 mEq/L (ref 135–145)
Total Bilirubin: 0.5 mg/dL (ref 0.2–1.2)
Total Protein: 6.7 g/dL (ref 6.0–8.3)

## 2014-02-21 MED ORDER — LUBIPROSTONE 8 MCG PO CAPS: 8.0000 ug | ORAL_CAPSULE | Freq: Two times a day (BID) | ORAL | Status: DC

## 2014-02-21 NOTE — Progress Notes (Signed)
Primary Care Physician:  Asencion Noble, MD  Primary Gastroenterologist:  Barney Drain, MD   Chief Complaint  Patient presents with  . Nausea    HPI:  Jared Gill is a 73 y.o. male here for urgent OV for anorexia, nausea, weight loss. Patient seen in the office back in February 2015 for nausea and weight loss at that time. CT scan at that time showed early or mild colitis involving the cecum and ascending colon (likely under distention), small hernia on the right inguinal area. Colonoscopy performed showing moderate diverticulosis, moderate sized internal hemorrhoids, anal canal stenosis. EGD showed medium-sized hiatal hernia, moderate erosive gastritis. Biopsies showed chronic gastritis with focal intestinal metaplasia, negative for H. Pylori.  Patient reports several week history of extreme nausea. Cannot eat. Wakes up with it in the mornings. Also with left sided abd pain. BM couple of times per week chronically. Recently had some pp diarrhea but now constipated again. No melena, brbpr. Weight down 10 pounds. C/o feeling nervous. Has tried zofran and now on compazine (once daily) for nausea. Bentyl switched to librax per Dr. Willey Blade.  Has not been taking his omeprazole regularly, had filled in 04/2013, #180 and has 1/2 bottle left.  Wt Readings from Last 3 Encounters:  02/21/14 208 lb 6.4 oz (94.53 kg)  02/07/14 216 lb (97.977 kg)  04/04/13 217 lb 6.4 oz (98.612 kg)    Current Outpatient Prescriptions  Medication Sig Dispense Refill  . amLODipine (NORVASC) 5 MG tablet Take 5 mg by mouth daily.    . baclofen (LIORESAL) 10 MG tablet Take 10 mg by mouth 2 (two) times daily as needed for muscle spasms. Take 1/2 tablet bid as needed    . clidinium-chlordiazePOXIDE (LIBRAX) 5-2.5 MG per capsule Take 1 capsule by mouth 2 (two) times daily.    [name] Kitchen doxazosin (CARDURA) 2 MG tablet Take 2 mg by mouth daily.    [name] Kitchen omeprazole (PRILOSEC) 20 MG capsule Take 1 capsule (20 mg total) by mouth daily. 90 capsule 3   . polyethylene glycol powder (GLYCOLAX/MIRALAX) powder Take 17 g by mouth daily as needed (for constipation).     . prochlorperazine (COMPAZINE) 10 MG tablet Take 10 mg by mouth 2 (two) times daily.    [name] Kitchen tetrahydrozoline 0.05 % ophthalmic solution Place 1 drop into both eyes daily as needed (redness relief).     No current facility-administered medications for this visit.    Allergies as of 02/21/2014  . (No Known Allergies)    Past Medical History  Diagnosis Date  . Hypertension   . Abnormal MRI, spine 05/12/2011  . Nausea 01/2014  . BPH (benign prostatic hypertrophy)   . Hx of adenomatous colonic polyps     h/o tubulovillous adenomas    Past Surgical History  Procedure Laterality Date  . Cholecystectomy  2009  . Polyp removal via colonoscopy    . Colonoscopy with esophagogastroduodenoscopy (egd) N/A 04/06/2013    BVQ:XIHWTUUE size internal hemorrhoids/moderate diverticulosis/medium sized HH    Family History  Problem Relation Age of Onset  . Diabetes Father   . Diabetes Sister   . Cancer Brother   . Colon cancer Neg Hx     History   Social History  . Marital Status: Single    Spouse Name: N/A    Number of Children: N/A  . Years of Education: N/A   Occupational History  . Not on file.   Social History Main Topics  . Smoking status: Former Smoker -- 0.50 packs/day for 40  years    Types: Cigarettes  . Smokeless tobacco: Never Used     Comment: Quit x 2 years  . Alcohol Use: Yes     Comment: occasional beer  . Drug Use: No  . Sexual Activity: Not on file   Other Topics Concern  . Not on file   Social History Narrative      ROS:  General: neg for fever. See hpi. Eyes: Negative for vision changes.  ENT: Negative for hoarseness, difficulty swallowing , nasal congestion. CV: Negative for chest pain, angina, palpitations, dyspnea on exertion, peripheral edema.  Respiratory: Negative for dyspnea at rest, dyspnea on exertion, cough, sputum, wheezing.   GI: See history of present illness. GU:  Negative for dysuria, hematuria, urinary incontinence, urinary frequency, nocturnal urination.  MS: Negative for joint pain, low back pain.  Derm: Negative for rash or itching.  Neuro: Negative for weakness, abnormal sensation, seizure, frequent headaches, memory loss, confusion.  Psych: Negative for anxiety, depression, suicidal ideation, hallucinations.  Endo: see hpi Heme: Negative for bruising or bleeding. Allergy: Negative for rash or hives.    Physical Examination:  BP 117/68 mmHg  Pulse 78  Temp(Src) 98.3 F (36.8 C) (Oral)  Ht 5\' 9"  (1.753 m)  Wt 208 lb 6.4 oz (94.53 kg)  BMI 30.76 kg/m2   General: Well-nourished, well-developed in no acute distress.  Head: Normocephalic, atraumatic.   Eyes: Conjunctiva pink, no icterus. Mouth: Oropharyngeal mucosa moist and pink , no lesions erythema or exudate. Neck: Supple without thyromegaly, masses, or lymphadenopathy.  Lungs: Clear to auscultation bilaterally.  Heart: Regular rate and rhythm, no murmurs rubs or gallops.  Abdomen: Bowel sounds are normal, moderate epig/luq tenderness, nondistended, no hepatosplenomegaly or masses, no abdominal bruits or    hernia , no rebound or guarding.   Rectal: not performed Extremities: No lower extremity edema. No clubbing or deformities.  Neuro: Alert and oriented x 4 , grossly normal neurologically.  Skin: Warm and dry, no rash or jaundice.   Psych: Alert and cooperative, normal mood and affect.  Labs: Lab Results  Component Value Date   WBC 5.0 02/07/2013   HGB 12.3* 02/07/2013   HCT 37.9* 02/07/2013   MCV 72.6* 02/07/2013   PLT 210 02/07/2013     Imaging Studies: No results found.

## 2014-02-21 NOTE — Patient Instructions (Signed)
1. Continue prochlorperazine twice per day for nausea. 2. Take omeprazole 20mg  once daily for your stomach. 3. Take Amitiza one pill twice a day with food for constipation (samples given). 4. Stop dicyclomine, chlorod/clidi (librax). I marked your bottles with an X.  5. Have your labs done today. 6. CT as scheduled.

## 2014-02-21 NOTE — Telephone Encounter (Signed)
Called pt and informed him of his CT appt. Told him to go to hospital on 02/25/2014 to pick up contrast and his CT will be on 02/26/2014 @ 8:00 am. Told him Nothing to eat or drink after midnight the night before.   PA# via [place] [phone]

## 2014-02-21 NOTE — Assessment & Plan Note (Signed)
10 pound weight loss in past few weeks associated with anorexia, nausea, constipation, luq abd pain/epig pain. Has not been taking omeprazole regularly. Up to date on EGD/TCS as outlined.  Obtain labs and CT A/P.  Increase compazine to 10mg  BID.  Stop bentyl and librax due to constipation. amitiza 76mcg bid with food, 5 days of samples provided.  Take omeprazole daily.  If symptoms worsen over the weekend, go to the ER.

## 2014-02-22 LAB — CBC WITH DIFFERENTIAL/PLATELET
Basophils Absolute: 0 10*3/uL (ref 0.0–0.1)
Basophils Relative: 0 % (ref 0–1)
Eosinophils Absolute: 0.1 10*3/uL (ref 0.0–0.7)
Eosinophils Relative: 1 % (ref 0–5)
HCT: 40.7 % (ref 39.0–52.0)
Hemoglobin: 13 g/dL (ref 13.0–17.0)
LYMPHS ABS: 1 10*3/uL (ref 0.7–4.0)
Lymphocytes Relative: 20 % (ref 12–46)
MCH: 23.3 pg — ABNORMAL LOW (ref 26.0–34.0)
MCHC: 31.9 g/dL (ref 30.0–36.0)
MCV: 72.9 fL — ABNORMAL LOW (ref 78.0–100.0)
MPV: 10 fL (ref 8.6–12.4)
Monocytes Absolute: 0.6 10*3/uL (ref 0.1–1.0)
Monocytes Relative: 12 % (ref 3–12)
NEUTROS PCT: 67 % (ref 43–77)
Neutro Abs: 3.5 10*3/uL (ref 1.7–7.7)
Platelets: 249 10*3/uL (ref 150–400)
RBC: 5.58 MIL/uL (ref 4.22–5.81)
RDW: 16.9 % — ABNORMAL HIGH (ref 11.5–15.5)
WBC: 5.2 10*3/uL (ref 4.0–10.5)

## 2014-02-22 LAB — TSH: TSH: 1.201 u[IU]/mL (ref 0.350–4.500)

## 2014-02-22 LAB — FERRITIN: FERRITIN: 449 ng/mL — AB (ref 22–322)

## 2014-02-26 ENCOUNTER — Ambulatory Visit (HOSPITAL_COMMUNITY)
Admission: RE | Admit: 2014-02-26 | Discharge: 2014-02-26 | Disposition: A | Discharge status: Home / Self Care | Payer: Commercial Managed Care - HMO | Source: Ambulatory Visit | Attending: Gastroenterology | Admitting: Gastroenterology

## 2014-02-26 ENCOUNTER — Telehealth: Payer: Self-pay | Admitting: Gastroenterology

## 2014-02-26 ENCOUNTER — Encounter (HOSPITAL_COMMUNITY): Payer: Self-pay

## 2014-02-26 DIAGNOSIS — R634 Abnormal weight loss: Secondary | ICD-10-CM | POA: Diagnosis not present

## 2014-02-26 DIAGNOSIS — D509 Iron deficiency anemia, unspecified: Secondary | ICD-10-CM | POA: Insufficient documentation

## 2014-02-26 DIAGNOSIS — R110 Nausea: Secondary | ICD-10-CM | POA: Diagnosis not present

## 2014-02-26 DIAGNOSIS — K5900 Constipation, unspecified: Secondary | ICD-10-CM | POA: Insufficient documentation

## 2014-02-26 MED ORDER — IOHEXOL 300 MG/ML  SOLN
100.0000 mL | Freq: Once | INTRAMUSCULAR | Status: AC | PRN
  Administered 2014-02-26: 100 mL via INTRAVENOUS

## 2014-02-26 NOTE — Telephone Encounter (Signed)
Correction: Date for humana referral to be turned in is 02/26/14 by midnight.

## 2014-02-26 NOTE — Telephone Encounter (Signed)
Spoke to PCP regarding humana referral and let Andee Poles know that as long as it was turned in by midnight 03/08/14 it would be covered for the visit on 02/21/14.  She said she would get on it.

## 2014-02-26 NOTE — Progress Notes (Signed)
cc'ed to pcp °

## 2014-02-26 NOTE — Telephone Encounter (Signed)
Spoke to pcp regarding humana referral and told them that they needed it in my midnight 02/26/14.  Danielle said she would try her best to get it done.

## 2014-02-27 NOTE — Progress Notes (Signed)
Quick Note:  Please let patient know that his labs looked good except tendency towards iron deficiency and his ferritin is little high (could indicate inflammation). CT unremarkable. He needs to take omeprazole EVERY day.  Continue Amitiza just started. I will discuss his case with the doctor and further recommendations to follow. ______

## 2014-02-28 NOTE — Progress Notes (Signed)
Quick Note:  Pt came by and I looked at his meds. He has been taking them correctly except the Amitiza 8 mcg only once a day. I told him to take it twice daily, especially since he has not been having good BM's.He knows to take it with food. I weighed him and he was at 207.2 today versus a little over 208 on 02/21/2014. He said he just feels so jittery, and he is very anxious and wonders why the results are good and him still feeling so bad. He did say that on 02/21/2014 they had a get together and he was able to eat really well. But since, it has been the same. He wanted to be seen again today and I told him that we did not have available appts but that Neil Crouch, PA was gong to discuss with Dr. Oneida Alar. He would like to hear back something today, he is due to go back to work tomorrow. He has been on vacation, and he does not want to lose his job.  Please advise! ______

## 2014-02-28 NOTE — Progress Notes (Signed)
Quick Note:  I called and gave pt his results. He said he is still not feeling any better. Pt is taking the Amitiza 8 mcg, but has not been taking it but once a day. He has not had a good Bm since Tues. Said he just does not have an appetite. He thinks he has lost more weight. He cannot eat much but has just gotten some Ensure in the last week. He drinks one of those when he cannot eat. He said he would come by and let me look at his meds. I told him that Neil Crouch, PA, marked them all last week. ______

## 2014-03-06 NOTE — Progress Notes (Signed)
Quick Note:  Please let pt know his CT showed diverticulosis only. I have discussed patient with Dr. Oneida Alar who plans to review chart and give further instructions. ______

## 2014-03-07 ENCOUNTER — Telehealth: Payer: Self-pay | Admitting: Gastroenterology

## 2014-03-07 NOTE — Telephone Encounter (Signed)
Noted  

## 2014-03-07 NOTE — Telephone Encounter (Signed)
Approval for Jared Gill [id number]  02/21/14-08/20/14  Good for 6 visits

## 2014-03-07 NOTE — Progress Notes (Signed)
Quick Note:  I called and informed pt and he said he is still having problems with his BM's. He is out of the Amitiza, but he said it did not work well anyway, even when he took it bid. Please advise! ______

## 2014-03-08 NOTE — Progress Notes (Signed)
Quick Note:  See [facility] recommendation, OV addendum note. No Amitiza, change to linzess plus other instructions. ______

## 2014-03-08 NOTE — Progress Notes (Signed)
Jared Gill, please touch base with patient. See [facility] instructions as below.

## 2014-03-08 NOTE — Progress Notes (Signed)
Quick Note:  I called pt and LMOM for a return call. Samples of Amitiza 24 mcg to take bid with food at front for pick up. ( #12 samples). ______

## 2014-03-08 NOTE — Progress Notes (Signed)
Quick Note:  OK. While we await response from [facility], let's increase his Amitiza to 30mcg BID. You can send in RX for #60 with 1 refill and/or give samples. ______

## 2014-03-08 NOTE — Progress Notes (Addendum)
REVIEWED.NL SBFT 2009.2009-CHOLECYSTECTOMY.   I PERSONALLY REVIEWED THE CT FEB 2015/JAN 2016 WITH DR. Thornton Papas: MOBILE CECUM, SML BIL INGUINAL HERNIAS. EGD/TCS 2015-GASTRITIS. PT WITH WEIGHT LOSS FEB 2015 217 BS TO 207 LBS TODAY. C/O PERSISTENT NAUSEA.   PLEASE CALL PT. HE SHOULD FOLLOW A LOW FAT DIET. HE SHOULD TAKE HIS OMEPRAZOLE 30 MINS PRIOR TO HIS FIRST MEAL DAILY AND COMPLETE A GASTRIC EMPTYING STUDY TO EVALUATE HIS NAUSEA. HE SHOULD STOP THE AMITIZA BECAUSE IT CAN CAUSE NAUSEA AND TRY LINZESS 145 MCG DAILY.

## 2014-03-11 NOTE — Progress Notes (Signed)
Quick Note:  Noted! Will do! ______

## 2014-03-12 ENCOUNTER — Other Ambulatory Visit: Payer: Self-pay

## 2014-03-12 ENCOUNTER — Telehealth: Payer: Self-pay

## 2014-03-12 DIAGNOSIS — R110 Nausea: Secondary | ICD-10-CM

## 2014-03-12 DIAGNOSIS — R634 Abnormal weight loss: Secondary | ICD-10-CM

## 2014-03-12 NOTE — Telephone Encounter (Signed)
Pt is set up for GES on 03-15-14 @ 8:00 am. PA #  [phone]

## 2014-03-12 NOTE — Telephone Encounter (Signed)
LMOM to call back

## 2014-03-12 NOTE — Telephone Encounter (Signed)
PLEASE SEE OV PROGRESS NOTE DATED 03/08/2014 BY DR. Oneida Alar.  Pt came by the office after I left him a message to call. I reviewed his results from Dr. Oneida Alar. Gave him samples of Linzess 145 mcg to take one daily 30 min before breakfast. ( He did not have any more samples of Amitiza).  He is aware that we will schedule the GES and give him a call.

## 2014-03-12 NOTE — Progress Notes (Signed)
LMOM to call.

## 2014-03-13 NOTE — Telephone Encounter (Signed)
Pt came by the office to see if we could change is appointment to Thursday since he was off. I was able to more it so now he will have the GES on 03/14/14 @ 8:00. He is aware of new time and date

## 2014-03-14 ENCOUNTER — Encounter (HOSPITAL_COMMUNITY): Payer: Self-pay

## 2014-03-14 ENCOUNTER — Encounter (HOSPITAL_COMMUNITY)
Admission: RE | Admit: 2014-03-14 | Discharge: 2014-03-14 | Disposition: A | Discharge status: Home / Self Care | Payer: Commercial Managed Care - HMO | Source: Ambulatory Visit | Attending: Gastroenterology | Admitting: Gastroenterology

## 2014-03-14 ENCOUNTER — Ambulatory Visit (INDEPENDENT_AMBULATORY_CARE_PROVIDER_SITE_OTHER): Payer: Commercial Managed Care - HMO | Admitting: Gastroenterology

## 2014-03-14 ENCOUNTER — Encounter: Payer: Commercial Managed Care - HMO | Admitting: Gastroenterology

## 2014-03-14 ENCOUNTER — Telehealth: Payer: Self-pay

## 2014-03-14 ENCOUNTER — Encounter: Payer: Self-pay | Admitting: Gastroenterology

## 2014-03-14 ENCOUNTER — Telehealth: Payer: Self-pay | Admitting: Gastroenterology

## 2014-03-14 DIAGNOSIS — R110 Nausea: Secondary | ICD-10-CM | POA: Diagnosis not present

## 2014-03-14 DIAGNOSIS — R634 Abnormal weight loss: Secondary | ICD-10-CM | POA: Insufficient documentation

## 2014-03-14 DIAGNOSIS — K5901 Slow transit constipation: Secondary | ICD-10-CM

## 2014-03-14 MED ORDER — METOCLOPRAMIDE HCL 5 MG PO TABS: ORAL_TABLET | ORAL | Status: DC

## 2014-03-14 MED ORDER — TECHNETIUM TC 99M SULFUR COLLOID
2.0000 | Freq: Once | INTRAVENOUS | Status: AC | PRN
  Administered 2014-03-14: 2 via ORAL

## 2014-03-14 NOTE — Progress Notes (Signed)
   Subjective:    Patient ID: Jared Gill, male    DOB: 01-Jan-1941, 74 y.o.   MRN: [id number]  HPI   Past Medical History  Diagnosis Date  . Hypertension   . Abnormal MRI, spine 05/12/2011  . Nausea 01/2014  . BPH (benign prostatic hypertrophy)   . Hx of adenomatous colonic polyps     h/o tubulovillous adenomas     Review of Systems     Objective:   Physical Exam        Assessment & Plan:

## 2014-03-14 NOTE — Telephone Encounter (Signed)
Reviewed meds with pt. Work note printed.

## 2014-03-14 NOTE — Progress Notes (Addendum)
   Subjective:    Patient ID: Jared Gill, male    DOB: [birth year], 74 y.o.   MRN: [id number]  Asencion Noble, MD  HPI Pt c/o daily nausea. FEELS SICK AT HIS STOMACH EVERY MORNING. BOWEL DON'T MOVE. JUST STARTED LINZESS. DRINKS WATER. MOUTH STAYS DRY. FEELS SHAKY IN HIS HANDS. A little diarrhea last week. Last BM: TUES. FEEL S BLOATED ON LEFT SIDE.  PT DENIES FEVER, CHILLS, HEMATOCHEZIA, vomiting, melena, diarrhea, CHEST PAIN, SHORTNESS OF BREATH,  CHANGE IN BOWEL IN HABITS, abdominal pain, problems swallowing, OR heartburn or indigestion.   Past Medical History  Diagnosis Date  . Hypertension   . Abnormal MRI, spine 05/12/2011  . Nausea 01/2014  . BPH (benign prostatic hypertrophy)   . Hx of adenomatous colonic polyps     h/o tubulovillous adenomas    Past Surgical History  Procedure Laterality Date  . Cholecystectomy  2009  . Polyp removal via colonoscopy    . Colonoscopy with esophagogastroduodenoscopy (egd) N/A 04/06/2013    YIR:SWNIOEVO size internal hemorrhoids/moderate diverticulosis/medium sized HH   No Known Allergies  Current Outpatient Prescriptions  Medication Sig Dispense Refill  . amLODipine (NORVASC) 5 MG tablet Take 5 mg by mouth daily.    . baclofen (LIORESAL) 10 MG tablet Take 10 mg by mouth 2 (two) times daily as needed for muscle spasms. Take 1/2 tablet bid as needed    . clidinium-chlordiazePOXIDE (LIBRAX) 5-2.5 MG per capsule Take 1 capsule by mouth 2 (two) times daily.    [name] Kitchen doxazosin (CARDURA) 2 MG tablet Take 2 mg by mouth daily.    . Linaclotide (LINZESS) 145 MCG CAPS capsule Take 145 mcg by mouth daily.    [name] Kitchen omeprazole (PRILOSEC) 20 MG capsule Take 1 capsule (20 mg total) by mouth daily.    . polyethylene glycol powder (GLYCOLAX/MIRALAX) powder Take 17 g by mouth daily as needed (for constipation).     . prochlorperazine (COMPAZINE) 10 MG tablet Take 10 mg by mouth 2 (two) times daily. Only taking as needed    . tetrahydrozoline 0.05 % ophthalmic solution  Place 1 drop into both eyes daily as needed (redness relief).       Review of Systems PER HPI OTHERWISE ALL SYSTEMS ARE NEGATIVE.     Objective:   Physical Exam  Constitutional: He is oriented to person, place, and time. He appears well-developed and well-nourished. No distress.  HENT:  Head: Normocephalic and atraumatic.  Mouth/Throat: Oropharynx is clear and moist. No oropharyngeal exudate.  Eyes: Pupils are equal, round, and reactive to light. No scleral icterus.  Neck: Normal range of motion. Neck supple.  Cardiovascular: Normal rate, regular rhythm and normal heart sounds.   Pulmonary/Chest: Effort normal and breath sounds normal. No respiratory distress.  Abdominal: Soft. Bowel sounds are normal. He exhibits no distension. There is no tenderness.  Musculoskeletal: He exhibits no edema.  Lymphadenopathy:    He has no cervical adenopathy.  Neurological: He is alert and oriented to person, place, and time.  Psychiatric: He has a normal mood and affect.  Vitals reviewed.         Assessment & Plan:

## 2014-03-14 NOTE — Telephone Encounter (Signed)
PT HAD GES TODAY. FEELINGNAUSEATED AND FEELS LIKE HE CAN'T WORK TOMORROW. EXPLAINED TO PT IF HIS ANXIETY IS CAUSING HIM TO BE ANXIOUS AND NAUSEATED I CANNOT FIX THAT AND HE NEEDS TO SEE DR. FAGAN. IF WORK I STRESSING HIM OUT, I RECOMMEND HE RETIRE.   HE SAID A MEDICINE DOCTOR FAGAN GAVE HIM MADE HIM FEEL BETTER(?LIBRAX). PT INSTRUCTED TO STOP AMITIZA. START LINZESS FOR CONSTIPATION. HE NEEDS TO USE A NAUSEA PILL EVERY AM BEFORE HIS FEET HIT THE FLOOR. AWAIT GES STUDY. HE WILL BRING MEDS BY TO CONFIRM HIS LIST AND ADJUSTMENTS AND ADDITIONS WILL BE MADE BASED ON HIS CURRENT REGIMEN.

## 2014-03-14 NOTE — Patient Instructions (Addendum)
1. FOLLOW A STEP 3 GASTROPARESIS DIET. GET YOUR LABS TODAY.  2. START REGLAN 5 MG 30 MINS PRIOR TO MEALS THREE TIMES A DAY AND AT BEDTIME. IT MAY CAUSE AGITATION, DRAINAGE FROM HER BREAST, TARDIVE DYSKINESIA, OR CHANGES IN VISION.TAKE USE ZOFRAN(ONDANSETRON) ONE PILL AS NEEDED TO TREAT NAUSEA.  3. TAKE LINZESS (LINACTIOLIDE) WITH BREAKFAST TO TREAT CONSTIPATION.  4. DRINK WATER TO KEEP YOUR URINE LIGHT YELLOW.   5. USE MIRALAX 1 SCOOP EVERY MON, WED, AND FRI.  6.  CONTINUE LIBRAX TWICE A DAY.  7. CALL IN ONE MONTH IF YOU ARE STILL NOT HAVING A BM 2 TO 3 TIMES A WEEK OR YOUR NAUSEA IS NOT CONTROLLED.  8. TAKE OMEPRAZOLE TWICE A DAY TO CONTROL ACID REFLUX.  9. RETURN TO WORK JAN 25.  10. FOLLOW UP IN 3 MOS.

## 2014-03-14 NOTE — Assessment & Plan Note (Signed)
SX DUE TO GASTROPARESIS.  FOLLOW A STEP 3 GASTROPARESIS DIET. GET YOUR LABS TODAY: CORTISOL, HGA1C. START REGLAN 5 MG 30 MINS PRIOR TO MEALS THREE TIMES A DAY AND AT BEDTIME. IT MAY CAUSE AGITATION, DRAINAGE FROM HER BREAST, TARDIVE DYSKINESIA, OR CHANGES IN VISION.TAKE USE ZOFRAN(ONDANSETRON) ONE PILL AS NEEDED TO TREAT NAUSEA. CALL IN ONE MONTH IF YOU ARE STILL NOT HAVING A BM 2 TO 3 TIMES A WEEK OR YOUR NAUSEA IS NOT CONTROLLED. ZOFRAN AS NEEDED. TAKE OMEPRAZOLE TWICE A DAY TO CONTROL ACID REFLUX. RETURN TO WORK JAN 25.

## 2014-03-14 NOTE — Addendum Note (Signed)
Addended by: Danie Binder on: 03/14/2014 01:39 PM   Modules accepted: Orders

## 2014-03-14 NOTE — Telephone Encounter (Signed)
PT SHOULD:  1. TAKE OMEPRAZOLE TWICE A DAY  2. TAKE LINZESS (LINACTIOLIDE)  WITH BREAKFAST  3. TAKE ZOFRAN(ONDANSETRON) ONE PILL BEFORE YOUR FEET HIT THE FLOOR  4. CONTINUE LIBRAX TWICE A DAY.  5. USE MIRALAX 1 SCOOP EVERY MON, WED, AND FRI.  6. DRINK WATER TO KEEP YOUR URINE LIGHT YELLOW.  7. YOUR TEST RESULTS WILL BE BACK IN 14 DAYS OR YOU CAN LOOK THEM UP 3 DAYS  IN MY CHART AFTER YOUR TEST IS COMPLETE.   8. CALL IN ONE MONTH IF YOU ARE STILL NOT HAVING  A SATISFACTORY BM OR YOUR NAUSEA IS NOT CONTROLLED.  9. RETURN TO WORK JAN 23.

## 2014-03-14 NOTE — Telephone Encounter (Signed)
Pt brought his meds by the office.

## 2014-03-14 NOTE — Telephone Encounter (Signed)
PT MED LIST REVIEWED. HE IS NOT TAKING BENTYL, BUT MAY USE  ZOFRAN, ETODOLAC, HYDROCODONE,TRAMADOL, OR BACLOFEN AS NEEDED.

## 2014-03-14 NOTE — Assessment & Plan Note (Signed)
NOT IDEALLY CONTROLLED.  TAKE LINZESS (LINACTIOLIDE) WITH BREAKFAST TO TREAT CONSTIPATION. DRINK WATER TO KEEP YOUR URINE LIGHT YELLOW.  USE MIRALAX 1 SCOOP EVERY MON, WED, AND FRI. CONTINUE LIBRAX TWICE A DAY. CALL IN ONE MONTH IF YOU ARE STILL NOT HAVING A BM 2 TO 3 TIMES A WEEK OR YOUR NAUSEA IS NOT CONTROLLED. RETURN TO WORK JAN 25. FOLLOW UP IN 3 MOS.

## 2014-03-14 NOTE — Telephone Encounter (Signed)
See separate note.

## 2014-03-14 NOTE — Progress Notes (Signed)
ON RECALL LIST  °

## 2014-03-15 ENCOUNTER — Ambulatory Visit (HOSPITAL_COMMUNITY): Payer: Medicare HMO

## 2014-03-18 NOTE — Progress Notes (Signed)
cc'ed to pcp °

## 2014-03-19 LAB — HEMOGLOBIN A1C
Hgb A1c MFr Bld: 6.1 % — ABNORMAL HIGH (ref <5.7)
Mean Plasma Glucose: 128 mg/dL — ABNORMAL HIGH (ref <117)

## 2014-03-19 LAB — CORTISOL: CORTISOL PLASMA: 16.9 ug/dL

## 2014-03-28 ENCOUNTER — Telehealth: Payer: Self-pay

## 2014-03-28 NOTE — Telephone Encounter (Signed)
Pt came by the office with a bottle of Dicyclomine and Chlord/chid ( librax). He had been told at one time not to take these meds. He said he was taking the Librax because he pd $50.00 for it and he would not waste it. He has just a few pills left of the Librax and said he cannot afford any more of those.  He also had a small plastic bag with dark blue capsules in the bottle with the Dicyclomine. He did not have a clue what it was. I told him to please leave his pills in the bottles they come in. I asked him to go by the drug store and ask the pharmacist to identify the pills.  He said that he was just going to throw them away since he did not know what they are.   He just wants to know what is the difference in the Dicyclomine and Librax and also what he should take.  He would also like to hear from his lab results.

## 2014-03-28 NOTE — Telephone Encounter (Signed)
PLEASE CALL PT. HIS BLOOD TESTS ARE NORMAL. 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 BOTH CAN CAUSE CONSTIPATION.   FOLLOW A STEP 3 GASTROPARESIS DIET.   2. CONTINUE REGLAN 5 MG 30 MINS PRIOR TO MEALS THREE TIMES A DAY AND AT BEDTIME. IT MAY CAUSE AGITATION, DRAINAGE FROM HER BREAST, TARDIVE DYSKINESIA, OR CHANGES IN VISION.  USE ZOFRAN(ONDANSETRON) ONE PILL AS NEEDED TO TREAT NAUSEA.  3. TAKE LINZESS (LINACTIOLIDE) WITH BREAKFAST TO TREAT CONSTIPATION.  4. DRINK WATER TO KEEP YOUR URINE LIGHT YELLOW.   5. USE MIRALAX 1 SCOOP EVERY MON, WED, AND FRI.  6. STOP LIBRAX AND DICYCLOMINE BECAUSE THEY CAN CAUSE YOUR CONSTIPATION TO BE WORSE.  7. TAKE OMEPRAZOLE TWICE A DAY TO CONTROL ACID REFLUX.  8. FOLLOW UP IN 3 MOS E30 NAUSEA/CONSTIPATION.

## 2014-03-29 NOTE — Telephone Encounter (Signed)
PATIENT ON RECALL LIST

## 2014-04-01 NOTE — Telephone Encounter (Signed)
LMOM to call.

## 2014-04-01 NOTE — Telephone Encounter (Signed)
Pt is aware. He still does not understand well. Michela Pitcher he will come Wed and let me go over the meds with him again.

## 2014-04-03 ENCOUNTER — Encounter: Payer: Self-pay | Admitting: Gastroenterology

## 2014-04-03 NOTE — Telephone Encounter (Signed)
Pt came by the office and I reviewed his meds with him again. All of the meds he is not supposed to take are in a bag tied up and I told him NOT TO OPEN that bag.  He said that medicine cost him a lot of money and that is just a waste. I explained to him that sometimes meds do not work or they cause some other problem and the doctor knows what is best.  He has the numbers on the bottles now of how many times a day to take each of his meds.  He did not have Linzess and I gave him samples of Linzess 145 mcg to take one daily 30 min before breakfast.  I asked pt if he would like his meds packaged and he said yes. Then he said that he uses mail order some and [employer].   Everytime he comes by to ask about his meds he always says he never gets to see Dr. Oneida Alar.

## 2014-04-10 NOTE — Telephone Encounter (Signed)
REVIEWED-NO ADDITIONAL RECOMMENDATIONS. 

## 2014-04-15 ENCOUNTER — Telehealth: Payer: Self-pay

## 2014-04-15 NOTE — Telephone Encounter (Signed)
Pt came by the office c/o with abdominal pain. He pointed to his stomach midway and said the pain goes around to his left side. He said it is intermittent pain, but hurts so much and he just needs to find out what is wrong with him. He gets nauseated a lot, no vomiting. He wants an appt to see Dr. Oneida Alar, said he really needs to come in for an office visit with her and see if she can determine what is wrong with him. He said his stomach hurts bad sometimes, but I could never make him understand the pain scale. He is just very anxious about needed to see Dr. Oneida Alar. Please advise!

## 2014-04-16 NOTE — Telephone Encounter (Signed)
I called and LMOM for pt to come in tomorrow at 8:45 for a 9:00 appt to see Dr. Oneida Alar. I asked him to please call and let us know that he got the message.

## 2014-04-16 NOTE — Telephone Encounter (Signed)
PT IS GOING AROUND THE HOSPITAL GETTING PEOPLE TO TELL ME HE NEEDS TO TALK TO ME. UI HAVE BEEN APPROACHED BY 3 PEOPLE TODAY. Put pt in opv 0900 FEB 24 E30 ABDOMINAL PAIN.

## 2014-04-16 NOTE — Telephone Encounter (Signed)
APPOINTMENT MADE FOR 04/17/14 AT 9AM

## 2014-04-17 ENCOUNTER — Encounter: Payer: Self-pay | Admitting: Gastroenterology

## 2014-04-17 ENCOUNTER — Ambulatory Visit (INDEPENDENT_AMBULATORY_CARE_PROVIDER_SITE_OTHER): Payer: Commercial Managed Care - HMO | Admitting: Gastroenterology

## 2014-04-17 ENCOUNTER — Ambulatory Visit: Payer: Self-pay | Admitting: Gastroenterology

## 2014-04-17 VITALS — BP 117/68 | HR 88 | Temp 97.6°F | Ht 73.0 in | Wt 203.6 lb

## 2014-04-17 DIAGNOSIS — R1012 Left upper quadrant pain: Secondary | ICD-10-CM

## 2014-04-17 DIAGNOSIS — K3184 Gastroparesis: Secondary | ICD-10-CM

## 2014-04-17 DIAGNOSIS — R634 Abnormal weight loss: Secondary | ICD-10-CM

## 2014-04-17 DIAGNOSIS — K5901 Slow transit constipation: Secondary | ICD-10-CM

## 2014-04-17 MED ORDER — LINACLOTIDE 145 MCG PO CAPS: ORAL_CAPSULE | ORAL | Status: DC

## 2014-04-17 MED ORDER — OMEPRAZOLE 20 MG PO CPDR: 20.0000 mg | DELAYED_RELEASE_CAPSULE | Freq: Every day | ORAL | Status: DC

## 2014-04-17 NOTE — Progress Notes (Signed)
ON RECALL LIST  °

## 2014-04-17 NOTE — Assessment & Plan Note (Signed)
LIKELY DUE TO FUNCTIONAL ABDOMINAL PAIN. SX RESPOND TO LIBRAX AND BENTYL.  PT DOESN'T WANT TO PAY $53 FOR LIBRAX. TRY BENTYL BID FOLLOW UP IN 1 MO.

## 2014-04-17 NOTE — Assessment & Plan Note (Signed)
SX CONTROLLED.   CONTINUE LINZESS. HOLD MIRALAX FOR DIARRHEA. FOLLOW UP IN 1 MO.

## 2014-04-17 NOTE — Patient Instructions (Signed)
TAKE LINZESS, OMEPRAZOLE, DICYCLOMINE, AND REGLAN BEFORE BREAKFAST.  TAKE REGLAN BEFORE LUNCH.  TAKE REGLAN, OMEPRAZOLE, AND DICYCLOMINE BEFORE SUPPER.  TAKE REGLAN AND ZOFRAN AT BEDTIME.  USE MIRALAX 3 DAYS A WEEK IF NEEDED TO HAVE A BM.  FOLLOW UP IN 1 MOS.

## 2014-04-17 NOTE — Assessment & Plan Note (Addendum)
LOST 5 LBS IN MIDST OF ABDOMINAL PAIN AND ANXIETY DUE TO WORK STRESS.  CONTINUE TO MONITOR SYMPTOMS.

## 2014-04-17 NOTE — Assessment & Plan Note (Signed)
PT UNABLE TO UNDERSTAND DISEASE. TAKING REGLAN AFTER EATING. EXPLAINED HE NEEDS TO TAKE IT BEFORE. NO SIDE EFFECTS. LOST 5 BS SINCE JAN 2016. I PERSONALLY REVIEWED FILMS FROM 2009 TO PRESENT. SBFT 2009 DONE FOR NAUSEA, ABDOMINAL PAIN, ANOREXIA. I PERSONALLY REVIEWED THE CT JAN 2016 WITH DR. Zella Richer, BIL INGUINAL HERNIAS, MILD ASCVD AT ORIGIN OF SMA/CELIAC TRUNK, MOBILE CECUM W/O TORSION.  EXPLAINED MED REGIMEN TO PT AND FRIEND: MR. HARRIS START REGLAN 30 MINS PRIOR TO MEALS THREE TIMES A DAY AND AT BEDTIME.  GASTROPARESIS DIET OPV 1 MPO.

## 2014-04-17 NOTE — Progress Notes (Signed)
   Subjective:    Patient ID: RAYHAN GROLEAU, male    DOB: 1940/05/19, 74 y.o.   MRN: [id number]  Asencion Noble, MD  HPI Pt accompanied by friend: Burnett Corrente. Concerned about abdominal pain and nausea. Feels sick worse in the morning. NO VOMITING. BMs: MOVE WITH LINZESS BUT IT'S RUNNY. TAKING MIRALAX 3 DAYS A WEEK. PAIN(STABBING, LASTS FOR HRS, EVERY DAY) MOVES ON LEFT SIDE AND LOWER ABDOMEN. WAS BETTER WITH BENTYL OR LIBRAX. OFF BOTH FOR 3 WEEKS. PT UNABLE TO READ PILLS BOTTLES AND UNDERSTAND WHEN TO TAKE HIS MEDICINE.   PT DENIES FEVER, CHILLS, HEMATOCHEZIA, HEMATEMESIS, melena, CHEST PAIN, SHORTNESS OF BREATH,  constipation, problems swallowing, OR heartburn or indigestion.   Past Medical History  Diagnosis Date  . Hypertension   . Abnormal MRI, spine 05/12/2011  . Nausea 01/2014  . BPH (benign prostatic hypertrophy)   . Hx of adenomatous colonic polyps     h/o tubulovillous adenomas   Past Surgical History  Procedure Laterality Date  . Cholecystectomy  2009  . Polyp removal via colonoscopy    . Colonoscopy with esophagogastroduodenoscopy (egd) N/A 04/06/2013    IFO:YDXAJOIN size internal hemorrhoids/moderate diverticulosis/medium sized HH   No Known Allergies  Current Outpatient Prescriptions  Medication Sig Dispense Refill  . amLODipine (NORVASC) 5 MG tablet Take 5 mg by mouth daily.    [name] Kitchen doxazosin (CARDURA) 2 MG tablet Take 2 mg by mouth daily.    . metoCLOPramide (REGLAN) 5 MG tablet 1 po 30 minutes prior to meals TID AND AT BEDTIME    . Multiple Vitamin (MULTIVITAMIN) capsule Take 1 capsule by mouth daily.    [name] Kitchen omeprazole (PRILOSEC) 20 MG capsule Take 1 capsule (20 mg total) by mouth daily.    . polyethylene glycol powder (GLYCOLAX/MIRALAX) powder Take 17 g by mouth daily as needed (for constipation).  3 DAYS A WEEK   . tetrahydrozoline 0.05 % ophthalmic solution Place 1 drop into both eyes daily as needed (redness relief).    .      .      .      .        Review of  Systems     Objective:   Physical Exam  Constitutional: He is oriented to person, place, and time. He appears well-developed and well-nourished. No distress.  HENT:  Head: Normocephalic and atraumatic.  Mouth/Throat: Oropharynx is clear and moist. No oropharyngeal exudate.  Eyes: Pupils are equal, round, and reactive to light. No scleral icterus.  Neck: Normal range of motion. Neck supple.  Cardiovascular: Normal rate, regular rhythm and normal heart sounds.   Pulmonary/Chest: Effort normal and breath sounds normal. No respiratory distress.  Abdominal: Soft. Bowel sounds are normal. He exhibits no distension. There is tenderness. There is no rebound and no guarding.  POSITIVE CARNETT'S SIGN(PAIN INCREASED AFTER GETTING OFF EXAM TABLE)  Musculoskeletal: He exhibits no edema.  Lymphadenopathy:    He has no cervical adenopathy.  Neurological: He is alert and oriented to person, place, and time.  NO  NEW FOCAL DEFICITS   Psychiatric:  SLIGHTLY ANXIOUS MOOD, FLAT AFFECT  Vitals reviewed.         Assessment & Plan:

## 2014-04-22 ENCOUNTER — Encounter: Payer: Self-pay | Admitting: Gastroenterology

## 2014-04-23 NOTE — Progress Notes (Signed)
CC'ED TO PCP 

## 2014-04-29 ENCOUNTER — Telehealth: Payer: Self-pay

## 2014-04-29 ENCOUNTER — Emergency Department (HOSPITAL_COMMUNITY): Payer: Commercial Managed Care - HMO

## 2014-04-29 ENCOUNTER — Encounter (HOSPITAL_COMMUNITY): Payer: Self-pay | Admitting: *Deleted

## 2014-04-29 ENCOUNTER — Emergency Department (HOSPITAL_COMMUNITY)
Admission: EM | Admit: 2014-04-29 | Discharge: 2014-04-30 | Disposition: A | Discharge status: Home / Self Care | Payer: Commercial Managed Care - HMO | Attending: Emergency Medicine | Admitting: Emergency Medicine

## 2014-04-29 DIAGNOSIS — Z8719 Personal history of other diseases of the digestive system: Secondary | ICD-10-CM | POA: Insufficient documentation

## 2014-04-29 DIAGNOSIS — Z87438 Personal history of other diseases of male genital organs: Secondary | ICD-10-CM | POA: Insufficient documentation

## 2014-04-29 DIAGNOSIS — Z87891 Personal history of nicotine dependence: Secondary | ICD-10-CM | POA: Insufficient documentation

## 2014-04-29 DIAGNOSIS — Z9889 Other specified postprocedural states: Secondary | ICD-10-CM | POA: Diagnosis not present

## 2014-04-29 DIAGNOSIS — I10 Essential (primary) hypertension: Secondary | ICD-10-CM | POA: Insufficient documentation

## 2014-04-29 DIAGNOSIS — R1084 Generalized abdominal pain: Secondary | ICD-10-CM | POA: Diagnosis not present

## 2014-04-29 DIAGNOSIS — Z79899 Other long term (current) drug therapy: Secondary | ICD-10-CM | POA: Insufficient documentation

## 2014-04-29 DIAGNOSIS — Z9049 Acquired absence of other specified parts of digestive tract: Secondary | ICD-10-CM | POA: Diagnosis not present

## 2014-04-29 DIAGNOSIS — R109 Unspecified abdominal pain: Secondary | ICD-10-CM

## 2014-04-29 DIAGNOSIS — R110 Nausea: Secondary | ICD-10-CM | POA: Insufficient documentation

## 2014-04-29 DIAGNOSIS — F419 Anxiety disorder, unspecified: Secondary | ICD-10-CM | POA: Diagnosis not present

## 2014-04-29 DIAGNOSIS — Z86018 Personal history of other benign neoplasm: Secondary | ICD-10-CM | POA: Diagnosis not present

## 2014-04-29 HISTORY — DX: Diverticulitis of intestine, part unspecified, without perforation or abscess without bleeding: K57.92

## 2014-04-29 LAB — CBC WITH DIFFERENTIAL/PLATELET
BASOS ABS: 0 10*3/uL (ref 0.0–0.1)
Basophils Relative: 0 % (ref 0–1)
Eosinophils Absolute: 0.1 10*3/uL (ref 0.0–0.7)
Eosinophils Relative: 1 % (ref 0–5)
HCT: 36.5 % — ABNORMAL LOW (ref 39.0–52.0)
Hemoglobin: 11.8 g/dL — ABNORMAL LOW (ref 13.0–17.0)
LYMPHS ABS: 1.2 10*3/uL (ref 0.7–4.0)
LYMPHS PCT: 20 % (ref 12–46)
MCH: 23.5 pg — ABNORMAL LOW (ref 26.0–34.0)
MCHC: 32.3 g/dL (ref 30.0–36.0)
MCV: 72.6 fL — AB (ref 78.0–100.0)
MONO ABS: 0.7 10*3/uL (ref 0.1–1.0)
Monocytes Relative: 11 % (ref 3–12)
Neutro Abs: 4 10*3/uL (ref 1.7–7.7)
Neutrophils Relative %: 68 % (ref 43–77)
PLATELETS: 229 10*3/uL (ref 150–400)
RBC: 5.03 MIL/uL (ref 4.22–5.81)
RDW: 16.3 % — AB (ref 11.5–15.5)
WBC: 5.9 10*3/uL (ref 4.0–10.5)

## 2014-04-29 LAB — COMPREHENSIVE METABOLIC PANEL
ALBUMIN: 3.8 g/dL (ref 3.5–5.2)
ALT: 15 U/L (ref 0–53)
AST: 19 U/L (ref 0–37)
Alkaline Phosphatase: 56 U/L (ref 39–117)
Anion gap: 6 (ref 5–15)
BUN: 15 mg/dL (ref 6–23)
CO2: 25 mmol/L (ref 19–32)
Calcium: 9.1 mg/dL (ref 8.4–10.5)
Chloride: 105 mmol/L (ref 96–112)
Creatinine, Ser: 1.03 mg/dL (ref 0.50–1.35)
GFR calc Af Amer: 81 mL/min — ABNORMAL LOW (ref >90)
GFR calc non Af Amer: 69 mL/min — ABNORMAL LOW (ref >90)
Glucose, Bld: 118 mg/dL — ABNORMAL HIGH (ref 70–99)
POTASSIUM: 4.2 mmol/L (ref 3.5–5.1)
Sodium: 136 mmol/L (ref 135–145)
Total Bilirubin: 0.3 mg/dL (ref 0.3–1.2)
Total Protein: 6.6 g/dL (ref 6.0–8.3)

## 2014-04-29 MED ORDER — ONDANSETRON HCL 4 MG/2ML IJ SOLN
4.0000 mg | Freq: Once | INTRAMUSCULAR | Status: AC
  Administered 2014-04-29: 4 mg via INTRAVENOUS
  Filled 2014-04-29: qty 2

## 2014-04-29 MED ORDER — LORAZEPAM 2 MG/ML IJ SOLN
0.5000 mg | Freq: Once | INTRAMUSCULAR | Status: AC
  Administered 2014-04-29: 0.5 mg via INTRAVENOUS
  Filled 2014-04-29: qty 1

## 2014-04-29 MED ORDER — IOHEXOL 300 MG/ML  SOLN
100.0000 mL | Freq: Once | INTRAMUSCULAR | Status: AC | PRN
  Administered 2014-04-29: 100 mL via INTRAVENOUS

## 2014-04-29 MED ORDER — SODIUM CHLORIDE 0.9 % IV BOLUS (SEPSIS)
500.0000 mL | Freq: Once | INTRAVENOUS | Status: AC
  Administered 2014-04-29: 500 mL via INTRAVENOUS

## 2014-04-29 MED ORDER — SODIUM CHLORIDE 0.9 % IV SOLN
INTRAVENOUS | Status: DC
  Administered 2014-04-29: 21:00:00 via INTRAVENOUS

## 2014-04-29 NOTE — Telephone Encounter (Signed)
Pt is calling because his stomach is still hurting him. He is wanting to see [facility] ASAP. He said that he is sick and needs to be seen now. I told him that see sis not have anything until April. Please advise

## 2014-04-29 NOTE — Telephone Encounter (Signed)
PLEASE CALL PT. IF HE NEEDS TO BE SEEN NOW HE NEEDS TO GO TO TH ED. WE WILL SEE IF WE HAVE AN URGENT SPOT TO PUT HIM IN WITH AN EXTENDER BECAUSE MY SCHEDULE IF FULL. HE NEEDS TO TAKE HIS [place].

## 2014-04-29 NOTE — ED Notes (Signed)
Family at bedside at this time

## 2014-04-29 NOTE — ED Provider Notes (Signed)
CSN: [id number]     Arrival date & time 04/29/14  1552 History  This chart was scribed for Fredia Sorrow, MD by Molli Posey, ED Scribe. This patient was seen in room APA02/APA02 and the patient's care was started 8:04 PM.     Chief Complaint  Patient presents with  . Abdominal Pain   Patient is a 74 y.o. male presenting with abdominal pain. The history is provided by the patient. No language interpreter was used.  Abdominal Pain Pain location:  Generalized Pain severity:  Mild Onset quality:  Unable to specify Duration:  4 weeks Timing:  Unable to specify Progression:  Unable to specify Associated symptoms: nausea   Associated symptoms: no chest pain, no chills, no cough, no dysuria, no fever, no hematuria, no shortness of breath, no sore throat and no vomiting    HPI Comments: Jared Gill is a 74 y.o. male with a history of HTN, colonic polyps and diverticulitis who presents to the Emergency Department complaining of RLQ abominal pain for the last 3 weeks. He rates his pain as a 10/10 at this time. Pt states that he has been nervous the last 3 days which is atypical. He reports nausea, jittering and a decrease in appetite as well. He states he has not started any medications recently. Pt reports he was recently treated with Abx for his diverticulitis that he was dx with 3 weeks ago.   PCP FAGAN  Past Medical History  Diagnosis Date  . Hypertension   . Abnormal MRI, spine 05/12/2011  . Nausea 01/2014  . BPH (benign prostatic hypertrophy)   . Hx of adenomatous colonic polyps     h/o tubulovillous adenomas  . Diverticulitis    Past Surgical History  Procedure Laterality Date  . Cholecystectomy  2009  . Polyp removal via colonoscopy    . Colonoscopy with esophagogastroduodenoscopy (egd) N/A 04/06/2013    WSF:[id number] size internal hemorrhoids/moderate diverticulosis/medium sized HH   Family History  Problem Relation Age of Onset  . Diabetes Father   . Diabetes Sister    . Cancer Brother   . Colon cancer Neg Hx    History  Substance Use Topics  . Smoking status: Former Smoker -- 0.50 packs/day for 40 years    Types: Cigarettes  . Smokeless tobacco: Never Used     Comment: Quit x 2 years  . Alcohol Use: No     Comment: occasional beer    Review of Systems  Constitutional: Negative for fever and chills.  HENT: Negative for rhinorrhea and sore throat.   Eyes: Negative for visual disturbance.  Respiratory: Negative for cough and shortness of breath.   Cardiovascular: Negative for chest pain and leg swelling.  Gastrointestinal: Positive for nausea and abdominal pain. Negative for vomiting.  Genitourinary: Negative for dysuria and hematuria.  Musculoskeletal: Negative for back pain and neck pain.  Skin: Negative for rash.  Neurological: Negative for headaches.  Hematological: Does not bruise/bleed easily.  Psychiatric/Behavioral: Negative for confusion. The patient is nervous/anxious.       Allergies  Review of patient's allergies indicates no known allergies.  Home Medications   Prior to Admission medications   Medication Sig Start Date End Date Taking? Authorizing Provider  amLODipine (NORVASC) 5 MG tablet Take 5 mg by mouth daily.   Yes Historical Provider, MD  calcium carbonate (TUMS - DOSED IN MG ELEMENTAL CALCIUM) 500 MG chewable tablet Chew 1 tablet by mouth daily as needed for indigestion or heartburn.   Yes Historical  Provider, MD  dicyclomine (BENTYL) 10 MG capsule Take 10 mg by mouth 2 (two) times daily.   Yes Historical Provider, MD  doxazosin (CARDURA) 2 MG tablet Take 2 mg by mouth daily.   Yes Historical Provider, MD  Linaclotide Rolan Lipa) 145 MCG CAPS capsule 1 PO WITH BREAKFAST Patient taking differently: Take 145 mcg by mouth daily before breakfast.  04/17/14  Yes Danie Binder, MD  metoCLOPramide (REGLAN) 5 MG tablet 1 po 30 minutes prior to meals TID AND AT BEDTIME Patient taking differently: 4 (four) times daily -  before  meals and at bedtime.  03/14/14  Yes Danie Binder, MD  Multiple Vitamin (MULTIVITAMIN) capsule Take 1 capsule by mouth daily.   Yes Historical Provider, MD  omeprazole (PRILOSEC) 20 MG capsule Take 1 capsule (20 mg total) by mouth daily. 04/17/14  Yes Danie Binder, MD  ondansetron (ZOFRAN) 4 MG tablet Take 4 mg by mouth 3 (three) times daily as needed. For nausea 03/29/14  Yes Historical Provider, MD  polyethylene glycol powder (GLYCOLAX/MIRALAX) powder Take 17 g by mouth daily as needed (for constipation).    Yes Historical Provider, MD  tetrahydrozoline 0.05 % ophthalmic solution Place 1 drop into both eyes daily as needed (redness relief).   Yes Historical Provider, MD  HYDROcodone-acetaminophen (NORCO/VICODIN) 5-325 MG per tablet Take 1 tablet by mouth every 6 (six) hours as needed. 04/30/14   Fredia Sorrow, MD  LORazepam (ATIVAN) 1 MG tablet Take 1 tablet (1 mg total) by mouth 2 (two) times daily. 04/30/14   Fredia Sorrow, MD   BP 131/73 mmHg  Pulse 53  Temp(Src) 97.5 F (36.4 C) (Oral)  Resp 20  Ht 5\' 11"  (1.803 m)  Wt 200 lb 7 oz (90.918 kg)  BMI 27.97 kg/m2  SpO2 97% Physical Exam  Constitutional: He is oriented to person, place, and time. He appears well-developed and well-nourished.  HENT:  Head: Normocephalic and atraumatic.  Mouth/Throat: Oropharynx is clear and moist.  Eyes: EOM are normal. Pupils are equal, round, and reactive to light. Right eye exhibits no discharge. Left eye exhibits no discharge. No scleral icterus.  Neck: Neck supple. No tracheal deviation present.  Cardiovascular: Normal rate, regular rhythm and normal heart sounds.   Pulmonary/Chest: Effort normal and breath sounds normal. No respiratory distress. He has no wheezes. He has no rales.  Abdominal: Bowel sounds are normal. He exhibits no distension. There is no tenderness.  Musculoskeletal:  No swelling in ankles.   Neurological: He is alert and oriented to person, place, and time.  Skin: Skin is warm  and dry.  Psychiatric: He has a normal mood and affect. His behavior is normal.  Nursing note and vitals reviewed.   ED Course  Procedures  DIAGNOSTIC STUDIES: Oxygen Saturation is 97% on RA, normal by my interpretation.    COORDINATION OF CARE: 8:11 PM Discussed treatment plan with pt at bedside and pt agreed to plan.   Labs Review Labs Reviewed  CBC WITH DIFFERENTIAL/PLATELET - Abnormal; Notable for the following:    Hemoglobin 11.8 (*)    HCT 36.5 (*)    MCV 72.6 (*)    MCH 23.5 (*)    RDW 16.3 (*)    All other components within normal limits  COMPREHENSIVE METABOLIC PANEL - Abnormal; Notable for the following:    Glucose, Bld 118 (*)    GFR calc non Af Amer 69 (*)    GFR calc Af Amer 81 (*)    All other components  within normal limits   Results for orders placed or performed during the hospital encounter of 04/29/14  CBC with Differential  Result Value Ref Range   WBC 5.9 4.0 - 10.5 K/uL   RBC 5.03 4.22 - 5.81 MIL/uL   Hemoglobin 11.8 (L) 13.0 - 17.0 g/dL   HCT 36.5 (L) 39.0 - 52.0 %   MCV 72.6 (L) 78.0 - 100.0 fL   MCH 23.5 (L) 26.0 - 34.0 pg   MCHC 32.3 30.0 - 36.0 g/dL   RDW 16.3 (H) 11.5 - 15.5 %   Platelets 229 150 - 400 K/uL   Neutrophils Relative % 68 43 - 77 %   Neutro Abs 4.0 1.7 - 7.7 K/uL   Lymphocytes Relative 20 12 - 46 %   Lymphs Abs 1.2 0.7 - 4.0 K/uL   Monocytes Relative 11 3 - 12 %   Monocytes Absolute 0.7 0.1 - 1.0 K/uL   Eosinophils Relative 1 0 - 5 %   Eosinophils Absolute 0.1 0.0 - 0.7 K/uL   Basophils Relative 0 0 - 1 %   Basophils Absolute 0.0 0.0 - 0.1 K/uL  Comprehensive metabolic panel  Result Value Ref Range   Sodium 136 135 - 145 mmol/L   Potassium 4.2 3.5 - 5.1 mmol/L   Chloride 105 96 - 112 mmol/L   CO2 25 19 - 32 mmol/L   Glucose, Bld 118 (H) 70 - 99 mg/dL   BUN 15 6 - 23 mg/dL   Creatinine, Ser 1.03 0.50 - 1.35 mg/dL   Calcium 9.1 8.4 - 10.5 mg/dL   Total Protein 6.6 6.0 - 8.3 g/dL   Albumin 3.8 3.5 - 5.2 g/dL   AST 19  0 - 37 U/L   ALT 15 0 - 53 U/L   Alkaline Phosphatase 56 39 - 117 U/L   Total Bilirubin 0.3 0.3 - 1.2 mg/dL   GFR calc non Af Amer 69 (L) >90 mL/min   GFR calc Af Amer 81 (L) >90 mL/min   Anion gap 6 5 - 15     Imaging Review Ct Abdomen Pelvis W Contrast  04/29/2014   CLINICAL DATA:  Right lower quadrant pain for 3 weeks.  EXAM: CT ABDOMEN AND PELVIS WITH CONTRAST  TECHNIQUE: Multidetector CT imaging of the abdomen and pelvis was performed using the standard protocol following bolus administration of intravenous contrast.  CONTRAST:  121mL OMNIPAQUE IOHEXOL 300 MG/ML  SOLN  COMPARISON:  February 26, 2014  FINDINGS: The liver, spleen, pancreas, adrenal glands are normal. There is a 1.4 cm cyst in the lower pole of right kidney. The left kidney is normal. There is no hydronephrosis bilaterally. The patient is status post prior cholecystectomy. There is atherosclerosis of the abdominal aorta without aneurysmal dilatation. There is no abdominal lymphadenopathy. There is diverticulosis of colon without diverticulitis. There is no small bowel obstruction. The appendix is normal.  Fluid-filled bladder is normal. There is no pelvic lymphadenopathy. There are bilateral inguinal herniation of mesenteric fat. There are minimal dependent atelectasis of posterior lung bases. Degenerative joint changes of the spine are identified.  IMPRESSION: No acute abnormality identified in the abdomen and pelvis. There is diverticulosis without diverticulitis. The appendix is normal.   Electronically Signed   By: Abelardo Diesel M.D.   On: 04/29/2014 21:06     EKG Interpretation None      MDM   Final diagnoses:  Abdominal pain  Anxiety   Patient with workup for the abdominal pain. Patient was concerned about  reactivation of the diverticulitis. CT scan is negative for that. Electrolytes without significant abnormalities. No significant leukocytosis. Patient also seen to be having anxiety. Improved with Ativan improved  here with some pain medicine. We'll also give a prescription for antinausea medicine. Patient has follow-up available with his doctor and with GI.  I personally performed the services described in this documentation, which was scribed in my presence. The recorded information has been reviewed and is accurate.       Fredia Sorrow, MD 04/30/14 0020

## 2014-04-29 NOTE — Telephone Encounter (Signed)
Talked with patient and he said that he was going to the ER to get checked out. No appointment made at this time.

## 2014-04-29 NOTE — ED Notes (Signed)
Says he feels "shakey",x 1 month.  Being treated for diverticulitis,  No vomiting,  Says he can't eat or sleep.  Nausea.

## 2014-04-29 NOTE — ED Notes (Signed)
Patient keep stating "im sick, im sick, my nerves are bad, im nauseated" patient state he has a hard time sleeping and eating bc he is nauseated and nervous. A&OX4 at this time.

## 2014-04-30 MED ORDER — HYDROCODONE-ACETAMINOPHEN 5-325 MG PO TABS: 1.0000 | ORAL_TABLET | Freq: Four times a day (QID) | ORAL | Status: DC | PRN

## 2014-04-30 MED ORDER — ONDANSETRON 4 MG PO TBDP: 4.0000 mg | ORAL_TABLET | Freq: Three times a day (TID) | ORAL | Status: DC | PRN

## 2014-04-30 MED ORDER — LORAZEPAM 1 MG PO TABS: 1.0000 mg | ORAL_TABLET | Freq: Two times a day (BID) | ORAL | Status: DC

## 2014-04-30 NOTE — Discharge Instructions (Signed)
Workup for the abdominal pain CT scan was negative. Make up limited to follow-up with your doctor in your GI doctor. Trial of the Ativan to see if it helps the anxiety. Also prescribed for pain medicine provided as needed. Return for any new or worse symptoms.

## 2014-04-30 NOTE — Telephone Encounter (Addendum)
PLEASE CALL PT. Dr. Oneida Alar reviewed his records from the ED. HIS CT SCAN IS NORMAL. He should stop taking the Reglan.   It may be making him feel anxious and jittery. Take the Librax.HE SHOULD USE ZOFRAN AS NEEDED FOR NAUSEA. OPV E30  W/ [facility] MAR 17 AT 1130 DX: ABD PAIN, GASTROPARESIS. IF HIS ANXIIETY IS NOT IMPROVED AFTER STOPPING REGLAN AND STARTING LIBRAX, HE MAY NEED TO SEE A MENTAL HEALTH SPECIALIST.

## 2014-04-30 NOTE — Telephone Encounter (Signed)
Called pt and LMOM.  

## 2014-04-30 NOTE — ED Notes (Signed)
Patient verbalizes understanding of discharge instructions, prescription medications, home care and follow up care. Patient ambulatory out of department at this time with brother.

## 2014-04-30 NOTE — Telephone Encounter (Signed)
PATIENT WAS CALLED  WITH DATE AND TIME OF APPOINTMENT

## 2014-05-01 NOTE — Telephone Encounter (Signed)
Pt came by the office . I informed him of what Dr. Oneida Alar said. He showed me the meds that were written for him by the ED doctor. Hydrocodone 5/325 one every 6 hours prn ( He got #20) Zofran 4 mg the disintegrating tabs and he knows to let those dissolve versus the others are PO Lorazepam 1 mg #15 to take bid  He is aware to stop the Reglan. ( Placed it in a bag by itself) He did not have the Librax with him .  Please advise if any changes.

## 2014-05-01 NOTE — Telephone Encounter (Signed)
REVIEWED-NO ADDITIONAL RECOMMENDATIONS. 

## 2014-05-01 NOTE — Telephone Encounter (Signed)
LMOM to call.

## 2014-05-09 ENCOUNTER — Telehealth: Payer: Self-pay

## 2014-05-09 ENCOUNTER — Encounter: Payer: Self-pay | Admitting: Gastroenterology

## 2014-05-09 ENCOUNTER — Ambulatory Visit (INDEPENDENT_AMBULATORY_CARE_PROVIDER_SITE_OTHER): Payer: Commercial Managed Care - HMO | Admitting: Gastroenterology

## 2014-05-09 VITALS — BP 149/76 | HR 72 | Temp 97.9°F | Ht 72.0 in | Wt 203.8 lb

## 2014-05-09 DIAGNOSIS — F411 Generalized anxiety disorder: Secondary | ICD-10-CM

## 2014-05-09 DIAGNOSIS — K219 Gastro-esophageal reflux disease without esophagitis: Secondary | ICD-10-CM

## 2014-05-09 DIAGNOSIS — K3184 Gastroparesis: Secondary | ICD-10-CM

## 2014-05-09 DIAGNOSIS — K5901 Slow transit constipation: Secondary | ICD-10-CM

## 2014-05-09 MED ORDER — LORAZEPAM 1 MG PO TABS: 1.0000 mg | ORAL_TABLET | Freq: Two times a day (BID) | ORAL | Status: DC

## 2014-05-09 MED ORDER — MIRTAZAPINE 15 MG PO TABS: 15.0000 mg | ORAL_TABLET | Freq: Every day | ORAL | Status: DC

## 2014-05-09 MED ORDER — ONDANSETRON 4 MG PO TBDP: 4.0000 mg | ORAL_TABLET | Freq: Four times a day (QID) | ORAL | Status: DC | PRN

## 2014-05-09 NOTE — Progress Notes (Signed)
Subjective:    Patient ID: Jared Gill, male    DOB: [birth year], 74 y.o.   MRN: [id number]  Jared Noble, Jared Gill  HPI TAKING SLEEPING PILLS. ALSO TAKING ATIVAN. FEELS BETTER SINCE STOPPING REGLAN.  SICK AT STOMACH IN AM WHEN HE GETS UP. BMs: NOT EVERY DAY Q3 DAYS. TAKE MIRALAX AND LINZESS PRN. RARE SHARP ABD PAIN ACROSS THE MID-ABDOMEN.  PT DENIES FEVER, CHILLS, HEMATOCHEZIA, vomiting, melena, diarrhea, CHEST PAIN, SHORTNESS OF BREATH,  CHANGE IN BOWEL IN HABITS, problems swallowing, OR  heartburn or indigestion.   Past Medical History  Diagnosis Date  . Hypertension   . Abnormal MRI, spine 05/12/2011  . Nausea 01/2014  . BPH (benign prostatic hypertrophy)   . Hx of adenomatous colonic polyps     h/o tubulovillous adenomas  . Diverticulitis    Past Surgical History  Procedure Laterality Date  . Cholecystectomy  2009  . Polyp removal via colonoscopy    . Colonoscopy with esophagogastroduodenoscopy (egd) N/A 04/06/2013    [id number]:[id number] size internal hemorrhoids/moderate diverticulosis/medium sized HH    No Known Allergies  Current Outpatient Prescriptions  Medication Sig Dispense Refill  . amLODipine (NORVASC) 5 MG tablet Take 5 mg by mouth daily.    . calcium carbonate (TUMS - DOSED IN MG ELEMENTAL CALCIUM) 500 MG chewable tablet Chew 1 tablet by mouth daily as needed for indigestion or heartburn.    . dicyclomine (BENTYL) 10 MG capsule Take 10 mg by mouth 2 (two) times daily.    [name] Kitchen doxazosin (CARDURA) 2 MG tablet Take 2 mg by mouth daily.    [name] Kitchen HYDROcodone-acetaminophen (NORCO/VICODIN) 5-325 MG per tablet Take 1 tablet by mouth every 6 (six) hours as needed.    [name] Kitchen LORazepam (ATIVAN) 1 MG tablet Take 1 tablet (1 mg total) by mouth 2 (two) times daily.    . mirtazapine (REMERON) 15 MG tablet Take 15 mg by mouth at bedtime.    . Multiple Vitamin (MULTIVITAMIN) capsule Take 1 capsule by mouth daily.    [name] Kitchen omeprazole (PRILOSEC) 20 MG capsule Take 1 capsule (20 mg total) by mouth daily.      .      . ondansetron (ZOFRAN) 4 MG tablet Take 4 mg by mouth 3 (three) times daily as needed. For nausea    . polyethylene glycol powder (GLYCOLAX/MIRALAX) powder Take 17 g by mouth daily as needed (for constipation).     [name] Kitchen tetrahydrozoline 0.05 % ophthalmic solution Place 1 drop into both eyes daily as needed (redness relief).    .      .       Review of Systems     Objective:   Physical Exam  Constitutional: He is oriented to person, place, and time. He appears well-developed and well-nourished. No distress.  HENT:  Head: Normocephalic and atraumatic.  Mouth/Throat: Oropharynx is clear and moist. No oropharyngeal exudate.  Eyes: Pupils are equal, round, and reactive to light. No scleral icterus.  Neck: Normal range of motion. Neck supple.  Cardiovascular: Normal rate, regular rhythm and normal heart sounds.   Pulmonary/Chest: Effort normal and breath sounds normal. No respiratory distress.  Abdominal: Soft. Bowel sounds are normal. He exhibits no distension. There is no tenderness.  Musculoskeletal: He exhibits no edema.  Lymphadenopathy:    He has no cervical adenopathy.  Neurological: He is alert and oriented to person, place, and time.  NO FOCAL DEFICITS   Psychiatric: He has a normal mood and affect.  Vitals reviewed.  Assessment & Plan:

## 2014-05-09 NOTE — Telephone Encounter (Signed)
Rx for Ativan was faxed to [facility].

## 2014-05-09 NOTE — Patient Instructions (Signed)
PUT A ONDANSETRON(ZOFRAN) PILL UNDER YOUR TONGUE EVERY MORNING.  USE LORAZEPAM AS NEEDED FOR ANXIETY.  FOLLOW UP IN 2 MOS.

## 2014-05-09 NOTE — Assessment & Plan Note (Signed)
IDIOPATHIC. SYMPTOMS FAIRLY WELL CONTROLLED. ADVERSE REACTION TO REGLAN.  GASTROPARESIS DIET ZOFRAN QAM FOLLOW UP IN 2 MOS.

## 2014-05-09 NOTE — Assessment & Plan Note (Signed)
SYMPTOMS CONTROLLED/RESOLVED.  Use Prilosec 30 minutes prior to your first meal. FOLLOW UP IN 2 MOS.

## 2014-05-09 NOTE — Assessment & Plan Note (Signed)
SYMPTOMS FAIRLY WELL CONTROLLED. MANAGED WITH PRN LINZESS AND MIRALAX.  CONTINUE TO MONITOR SYMPTOMS. FOLLOW UP IN 2 MOS.

## 2014-05-09 NOTE — Assessment & Plan Note (Signed)
SYMPTOMS FAIRLY WELL CONTROLLED WITH PRN ATIVAN.  REFILLED ATIVAN #[id number]. REFILLS FROM PCP. CONTINUE TO MONITOR SYMPTOMS. FOLLOW UP IN 2 MOS.

## 2014-05-09 NOTE — Progress Notes (Signed)
ON RECALL LIST  °

## 2014-05-10 NOTE — Progress Notes (Signed)
cc'ed to pcp °

## 2014-05-31 ENCOUNTER — Encounter (HOSPITAL_COMMUNITY): Payer: Self-pay | Admitting: *Deleted

## 2014-05-31 ENCOUNTER — Emergency Department (HOSPITAL_COMMUNITY): Payer: PRIVATE HEALTH INSURANCE

## 2014-05-31 ENCOUNTER — Emergency Department (HOSPITAL_COMMUNITY)
Admission: EM | Admit: 2014-05-31 | Discharge: 2014-05-31 | Disposition: A | Discharge status: Home / Self Care | Payer: PRIVATE HEALTH INSURANCE | Attending: Emergency Medicine | Admitting: Emergency Medicine

## 2014-05-31 DIAGNOSIS — Z8719 Personal history of other diseases of the digestive system: Secondary | ICD-10-CM | POA: Diagnosis not present

## 2014-05-31 DIAGNOSIS — Z87891 Personal history of nicotine dependence: Secondary | ICD-10-CM | POA: Insufficient documentation

## 2014-05-31 DIAGNOSIS — S3992XA Unspecified injury of lower back, initial encounter: Secondary | ICD-10-CM | POA: Diagnosis present

## 2014-05-31 DIAGNOSIS — I10 Essential (primary) hypertension: Secondary | ICD-10-CM | POA: Insufficient documentation

## 2014-05-31 DIAGNOSIS — Z79899 Other long term (current) drug therapy: Secondary | ICD-10-CM | POA: Diagnosis not present

## 2014-05-31 DIAGNOSIS — Z86018 Personal history of other benign neoplasm: Secondary | ICD-10-CM | POA: Diagnosis not present

## 2014-05-31 DIAGNOSIS — Y9289 Other specified places as the place of occurrence of the external cause: Secondary | ICD-10-CM | POA: Diagnosis not present

## 2014-05-31 DIAGNOSIS — S300XXA Contusion of lower back and pelvis, initial encounter: Secondary | ICD-10-CM | POA: Insufficient documentation

## 2014-05-31 DIAGNOSIS — Y9389 Activity, other specified: Secondary | ICD-10-CM | POA: Diagnosis not present

## 2014-05-31 DIAGNOSIS — N400 Enlarged prostate without lower urinary tract symptoms: Secondary | ICD-10-CM | POA: Insufficient documentation

## 2014-05-31 DIAGNOSIS — Y998 Other external cause status: Secondary | ICD-10-CM | POA: Insufficient documentation

## 2014-05-31 DIAGNOSIS — W07XXXA Fall from chair, initial encounter: Secondary | ICD-10-CM | POA: Diagnosis not present

## 2014-05-31 NOTE — ED Notes (Signed)
Pt works in Estate manager/land agent here at [employer],  [employer] chair flipped backwards and he fell.  Pain low back. "my tail bone hurts".  No LOC.

## 2014-05-31 NOTE — ED Provider Notes (Signed)
CSN: [id number]     Arrival date & time 05/31/14  1145 History  This chart was scribed for Nat Christen, MD by Edison Simon, ED Scribe. This patient was seen in room APA19/APA19 and the patient's care was started at 12:15 PM.    Chief Complaint  Patient presents with  . Fall   The history is provided by the patient. No language interpreter was used.    HPI Comments: Jared Gill is a 74 y.o. male who presents to the Emergency Department complaining of falling just PTA. He works at [employer] here and fell when his chair flipped backwards, landing on his buttocks. He reports pain to low back "my tail bone." He has been ambulatory. He notes recent diverticulitis episode and states he is just starting to get his appetite back; he reports weight loss from 219 to 206.  Past Medical History  Diagnosis Date  . Hypertension   . Abnormal MRI, spine 05/12/2011  . Nausea 01/2014  . BPH (benign prostatic hypertrophy)   . Hx of adenomatous colonic polyps     h/o tubulovillous adenomas  . Diverticulitis    Past Surgical History  Procedure Laterality Date  . Cholecystectomy  2009  . Polyp removal via colonoscopy    . Colonoscopy with esophagogastroduodenoscopy (egd) N/A 04/06/2013    [id number] size internal hemorrhoids/moderate diverticulosis/medium sized HH   Family History  Problem Relation Age of Onset  . Diabetes Father   . Diabetes Sister   . Cancer Brother   . Colon cancer Neg Hx    History  Substance Use Topics  . Smoking status: Former Smoker -- 0.50 packs/day for 40 years    Types: Cigarettes  . Smokeless tobacco: Never Used     Comment: Quit x 2 years  . Alcohol Use: No     Comment: occasional beer    Review of Systems A complete 10 system review of systems was obtained and all systems are negative except as noted in the HPI and PMH.    Allergies  Reglan  Home Medications   Prior to Admission medications   Medication Sig Start Date End Date Taking? Authorizing  Provider  amLODipine (NORVASC) 5 MG tablet Take 5 mg by mouth daily.   Yes Historical Provider, MD  calcium carbonate (TUMS - DOSED IN MG ELEMENTAL CALCIUM) 500 MG chewable tablet Chew 1 tablet by mouth daily as needed for indigestion or heartburn.   Yes Historical Provider, MD  HYDROcodone-acetaminophen (NORCO/VICODIN) 5-325 MG per tablet Take 1 tablet by mouth every 6 (six) hours as needed. 04/30/14  Yes Fredia Sorrow, MD  Multiple Vitamin (MULTIVITAMIN) capsule Take 1 capsule by mouth daily.   Yes Historical Provider, MD  polyethylene glycol powder (GLYCOLAX/MIRALAX) powder Take 17 g by mouth daily as needed (for constipation).    Yes Historical Provider, MD  tetrahydrozoline 0.05 % ophthalmic solution Place 1 drop into both eyes daily as needed (redness relief).   Yes Historical Provider, MD  dicyclomine (BENTYL) 10 MG capsule Take 10 mg by mouth 2 (two) times daily.    Historical Provider, MD  doxazosin (CARDURA) 2 MG tablet Take 2 mg by mouth daily.    Historical Provider, MD  Linaclotide Rolan Lipa) 145 MCG CAPS capsule 1 PO WITH BREAKFAST Patient not taking: Reported on 05/09/2014 04/17/14   Danie Binder, MD  LORazepam (ATIVAN) 1 MG tablet Take 1 tablet (1 mg total) by mouth 2 (two) times daily. 05/09/14   Danie Binder, MD  mirtazapine (REMERON)  15 MG tablet Take 1 tablet (15 mg total) by mouth at bedtime. 05/09/14   Danie Binder, MD  omeprazole (PRILOSEC) 20 MG capsule Take 1 capsule (20 mg total) by mouth daily. 04/17/14   Danie Binder, MD  ondansetron (ZOFRAN ODT) 4 MG disintegrating tablet Take 1 tablet (4 mg total) by mouth every 6 (six) hours as needed for nausea. 05/09/14   Danie Binder, MD  ondansetron (ZOFRAN) 4 MG tablet Take 4 mg by mouth 3 (three) times daily as needed. For nausea 03/29/14   Historical Provider, MD   BP 158/70 mmHg  Pulse 65  Temp(Src) 97.7 F (36.5 C) (Oral)  Resp 18  Ht 6' (1.829 m)  Wt 206 lb (93.441 kg)  BMI 27.93 kg/m2  SpO2 99% Physical Exam   Constitutional: He is oriented to person, place, and time. He appears well-developed and well-nourished.  HENT:  Head: Normocephalic and atraumatic.  Eyes: Conjunctivae and EOM are normal. Pupils are equal, round, and reactive to light.  Neck: Normal range of motion. Neck supple.  Cardiovascular: Normal rate and regular rhythm.   Pulmonary/Chest: Effort normal and breath sounds normal.  Abdominal: Soft. Bowel sounds are normal.  Musculoskeletal: Normal range of motion.  Tender in left sacral coccyx area  Neurological: He is alert and oriented to person, place, and time.  Skin: Skin is warm and dry.  Psychiatric: He has a normal mood and affect. His behavior is normal.  Nursing note and vitals reviewed.   ED Course  Procedures (including critical care time)  DIAGNOSTIC STUDIES: Oxygen Saturation is 99% on room air, normal by my interpretation.    COORDINATION OF CARE: 12:18 PM Discussed treatment plan with patient at beside, including x-ray. The patient agrees with the plan and has no further questions at this time.   Labs Review Labs Reviewed - No data to display  Imaging Review Dg Sacrum/coccyx  05/31/2014   CLINICAL DATA:  He was fixing to sit in a chair and the chair slid out from under him, here at work today. He landed on his buttocks.  EXAM: SACRUM AND COCCYX - 2+ VIEW  COMPARISON:  CT 04/29/2014  FINDINGS: Within the lateral projection, there is widening of the joint space between the first and second coccygeal elements. This widening is anterior with a subtle posterior displacement of the second element in relation to the first. Finding is new from CT of 04/29/2014. No evidence of sacral fracture.  IMPRESSION: Widening of the joint space between the first and second coccygeal bone without clear evidence fracture.   Electronically Signed   By: Suzy Bouchard M.D.   On: 05/31/2014 13:37     EKG Interpretation None      MDM   Final diagnoses:  Coccyx contusion,  initial encounter   Plain films of sacrum and coccyx show no obvious fracture. Patient is hemodynamically stable.  I personally performed the services described in this documentation, which was scribed in my presence. The recorded information has been reviewed and is accurate.   Nat Christen, MD 05/31/14 [phone]

## 2014-05-31 NOTE — Discharge Instructions (Signed)
X-ray shows no obvious fracture. You'll be sore for several days. Tylenol or ibuprofen for pain. Ice pack.

## 2014-06-19 ENCOUNTER — Encounter: Payer: Self-pay | Admitting: Gastroenterology

## 2014-06-27 ENCOUNTER — Other Ambulatory Visit: Payer: Self-pay | Admitting: Gastroenterology

## 2014-06-28 NOTE — Telephone Encounter (Signed)
Per [facility] last note, "REFILLED ATIVAN #[id number]. REFILLS FROM PCP." Patient should contact his PCP for further refills on his Ativan.

## 2014-07-01 ENCOUNTER — Telehealth: Payer: Self-pay

## 2014-07-01 NOTE — Telephone Encounter (Signed)
LMOM at his brother's for a return call.

## 2014-07-01 NOTE — Telephone Encounter (Signed)
PLEASE CALL PT. WHEN I WROTE FOR HIS ATIVAN. I TOLD HIM HE WOULD NEED TO GET REFILLED FROM DR. FAGAN. HE SHOULD CONTACT DR. FAGAN'S OFC FOR A NEW Rx FOR ATIVAN.

## 2014-07-01 NOTE — Telephone Encounter (Signed)
Pt would like a refill on his Lorazepam 1 mg. He last had #60 on 06/05/2014. He takes one bid. He would like to get #90 at the time so he will not have to pay for it. Please send to the [facility].

## 2014-07-02 NOTE — Telephone Encounter (Signed)
Pt is aware.  

## 2014-07-25 ENCOUNTER — Encounter (INDEPENDENT_AMBULATORY_CARE_PROVIDER_SITE_OTHER): Payer: Self-pay | Admitting: Gastroenterology

## 2014-07-25 ENCOUNTER — Telehealth: Payer: Self-pay | Admitting: Gastroenterology

## 2014-07-25 NOTE — Telephone Encounter (Signed)
Pt was a no show

## 2014-07-25 NOTE — Progress Notes (Signed)
   Subjective:    Patient ID: Jared Gill, male    DOB: 1940/09/03, 74 y.o.   MRN: [id number]  HPI   Past Medical History  Diagnosis Date  . Hypertension   . Abnormal MRI, spine 05/12/2011  . Nausea 01/2014  . BPH (benign prostatic hypertrophy)   . Hx of adenomatous colonic polyps     h/o tubulovillous adenomas  . Diverticulitis     Review of Systems     Objective:   Physical Exam        Assessment & Plan:

## 2014-07-30 NOTE — Telephone Encounter (Signed)
REVIEWED-NO ADDITIONAL RECOMMENDATIONS. 

## 2014-08-22 ENCOUNTER — Encounter: Payer: Self-pay | Admitting: Gastroenterology

## 2014-08-22 ENCOUNTER — Ambulatory Visit (INDEPENDENT_AMBULATORY_CARE_PROVIDER_SITE_OTHER): Payer: Commercial Managed Care - HMO | Admitting: Gastroenterology

## 2014-08-22 VITALS — BP 122/67 | HR 68 | Temp 97.6°F | Ht 70.0 in | Wt 200.4 lb

## 2014-08-22 DIAGNOSIS — K3184 Gastroparesis: Secondary | ICD-10-CM | POA: Diagnosis not present

## 2014-08-22 DIAGNOSIS — K5901 Slow transit constipation: Secondary | ICD-10-CM | POA: Diagnosis not present

## 2014-08-22 DIAGNOSIS — K219 Gastro-esophageal reflux disease without esophagitis: Secondary | ICD-10-CM

## 2014-08-22 DIAGNOSIS — R634 Abnormal weight loss: Secondary | ICD-10-CM | POA: Diagnosis not present

## 2014-08-22 MED ORDER — OMEPRAZOLE 20 MG PO CPDR: DELAYED_RELEASE_CAPSULE | ORAL | Status: AC

## 2014-08-22 MED ORDER — ONDANSETRON HCL 4 MG PO TABS: ORAL_TABLET | ORAL | Status: DC

## 2014-08-22 NOTE — Assessment & Plan Note (Signed)
WEIGHT STABILIZING: 200 LBS TODAY.   CONTINUE TO MONITOR WEIGHT. FOLLOW UP IN 3 MOS.

## 2014-08-22 NOTE — Progress Notes (Signed)
CC'ED TO PCP 

## 2014-08-22 NOTE — Assessment & Plan Note (Signed)
SYMPTOMS FAIRLY WELL CONTROLLED.  Add ZOFRAN AT BEDTIME. CONTINUE TO MONITOR WEIGHT. FOLLOW UP IN 3 MOS.

## 2014-08-22 NOTE — Assessment & Plan Note (Signed)
SYMPTOMS FAIRLY WELL CONTROLLED.  OMEPRAZOLE BID TO BETTER CONTROL NAUSEA. FOLLOW UP IN 3 MOS.

## 2014-08-22 NOTE — Patient Instructions (Signed)
CONTINUE OMEPRAZOLE.  TAKE 30 MINUTES PRIOR TO BREAKFAST AND AT BEDTIME.  TAKE LINZESS EVERY MON, WED, & FRI.  ADD ZOFRAN AT BEDTIME.  FOLLOW UP IN 3 MOS.

## 2014-08-22 NOTE — Progress Notes (Signed)
   Subjective:    Patient ID: Jared Gill, male    DOB: 1940-12-21, 74 y.o.   MRN: [id number]  Asencion Noble, MD   HPI MAY HAVE TROUBLE MOVING BOWELS. TAKING LINZESS PRN. LINZESS CAN CAUSE WATERY STOOLS. NAUSEA EVERY MORNING BUT GETS BETTER IN THE AFTERNOON. APPETITE BAD IN THE MORNING BUT BETTER IN AFTERNOON. PAIN IN LUQ WHEN HE FEELS NAUSEATED.   PT DENIES FEVER, CHILLS, HEMATOCHEZIA, vomiting, melena, CHEST PAIN, SHORTNESS OF BREATH,  CHANGE IN BOWEL IN HABITS, problems swallowing, OR heartburn or indigestion.  Past Medical History  Diagnosis Date  . Hypertension   . Abnormal MRI, spine 05/12/2011  . Nausea 01/2014  . BPH (benign prostatic hypertrophy)   . Hx of adenomatous colonic polyps     h/o tubulovillous adenomas  . Diverticulitis    Past Surgical History  Procedure Laterality Date  . Cholecystectomy  2009  . Polyp removal via colonoscopy    . Colonoscopy with esophagogastroduodenoscopy (egd) N/A 04/06/2013    [id number] size internal hemorrhoids/moderate diverticulosis/medium sized HH    Allergies  Allergen Reactions  . Reglan [Metoclopramide]     ANXIETY, AGITATION, JITTERY    Current Outpatient Prescriptions  Medication Sig Dispense Refill  . amLODipine (NORVASC) 5 MG tablet Take 5 mg by mouth daily.    .      . doxazosin (CARDURA) 2 MG tablet Take 2 mg by mouth daily.    . Linaclotide (LINZESS) 145 MCG CAPS capsule 1 PO WITH BREAKFAST PRN   . LORazepam (ATIVAN) 1 MG tablet Take 1 tablet PO BID     . omeprazole (PRILOSEC) 20 MG capsule 1 PO 30 MINS PRIOR TO BREAKFAST     . calcium carbonate (TUMS - DOSED IN MG ELEMENTAL CALCIUM) 500 MG chewable tablet Chew 1 tablet by mouth daily as needed for indigestion or heartburn.    .      .      . Multiple Vitamin (MULTIVITAMIN) capsule Take 1 capsule by mouth daily.    .      .      .      [name] Kitchen tetrahydrozoline 0.05 % ophthalmic solution Place 1 drop into both eyes daily as needed (redness relief).        Review  of Systems     Objective:   Physical Exam  Constitutional: He is oriented to person, place, and time. He appears well-developed and well-nourished. No distress.  HENT:  Head: Normocephalic and atraumatic.  Mouth/Throat: Oropharynx is clear and moist. No oropharyngeal exudate.  Eyes: Pupils are equal, round, and reactive to light. No scleral icterus.  Neck: Normal range of motion. Neck supple.  Cardiovascular: Normal rate, regular rhythm and normal heart sounds.   Pulmonary/Chest: Effort normal and breath sounds normal. No respiratory distress.  Abdominal: Soft. Bowel sounds are normal. He exhibits no distension. There is no tenderness.  Musculoskeletal: He exhibits no edema.  Lymphadenopathy:    He has no cervical adenopathy.  Neurological: He is alert and oriented to person, place, and time.  Psychiatric: He has a normal mood and affect.  Vitals reviewed.         Assessment & Plan:

## 2014-08-22 NOTE — Assessment & Plan Note (Signed)
SYMPTOMS FAIRLY WELL CONTROLLED.  AVOID BENTYL. LINZESS QMWF FOLLOW UP IN 3 MOS.

## 2014-09-23 ENCOUNTER — Encounter: Payer: Self-pay | Admitting: Gastroenterology

## 2014-10-07 ENCOUNTER — Other Ambulatory Visit (HOSPITAL_COMMUNITY): Payer: Self-pay | Admitting: Oncology

## 2014-10-07 ENCOUNTER — Encounter (HOSPITAL_COMMUNITY): Payer: Self-pay | Admitting: Emergency Medicine

## 2014-10-07 ENCOUNTER — Emergency Department (HOSPITAL_COMMUNITY): Payer: Commercial Managed Care - HMO

## 2014-10-07 ENCOUNTER — Emergency Department (HOSPITAL_COMMUNITY)
Admission: EM | Admit: 2014-10-07 | Discharge: 2014-10-07 | Disposition: A | Discharge status: Home / Self Care | Payer: Commercial Managed Care - HMO | Attending: Emergency Medicine | Admitting: Emergency Medicine

## 2014-10-07 DIAGNOSIS — H538 Other visual disturbances: Secondary | ICD-10-CM

## 2014-10-07 DIAGNOSIS — D332 Benign neoplasm of brain, unspecified: Secondary | ICD-10-CM | POA: Insufficient documentation

## 2014-10-07 DIAGNOSIS — Z79899 Other long term (current) drug therapy: Secondary | ICD-10-CM | POA: Diagnosis not present

## 2014-10-07 DIAGNOSIS — I10 Essential (primary) hypertension: Secondary | ICD-10-CM | POA: Diagnosis not present

## 2014-10-07 DIAGNOSIS — Z86010 Personal history of colonic polyps: Secondary | ICD-10-CM | POA: Diagnosis not present

## 2014-10-07 DIAGNOSIS — Z87891 Personal history of nicotine dependence: Secondary | ICD-10-CM | POA: Insufficient documentation

## 2014-10-07 DIAGNOSIS — Z8719 Personal history of other diseases of the digestive system: Secondary | ICD-10-CM | POA: Diagnosis not present

## 2014-10-07 DIAGNOSIS — N400 Enlarged prostate without lower urinary tract symptoms: Secondary | ICD-10-CM | POA: Diagnosis not present

## 2014-10-07 DIAGNOSIS — D496 Neoplasm of unspecified behavior of brain: Secondary | ICD-10-CM

## 2014-10-07 LAB — CBC
HCT: 40.2 % (ref 39.0–52.0)
Hemoglobin: 13 g/dL (ref 13.0–17.0)
MCH: 23.4 pg — AB (ref 26.0–34.0)
MCHC: 32.3 g/dL (ref 30.0–36.0)
MCV: 72.3 fL — ABNORMAL LOW (ref 78.0–100.0)
Platelets: 199 10*3/uL (ref 150–400)
RBC: 5.56 MIL/uL (ref 4.22–5.81)
RDW: 16.1 % — ABNORMAL HIGH (ref 11.5–15.5)
WBC: 5.2 10*3/uL (ref 4.0–10.5)

## 2014-10-07 LAB — PROTIME-INR
INR: 1.1 (ref 0.00–1.49)
Prothrombin Time: 14.4 seconds (ref 11.6–15.2)

## 2014-10-07 LAB — COMPREHENSIVE METABOLIC PANEL
ALK PHOS: 66 U/L (ref 38–126)
ALT: 16 U/L — AB (ref 17–63)
AST: 20 U/L (ref 15–41)
Albumin: 4 g/dL (ref 3.5–5.0)
Anion gap: 8 (ref 5–15)
BILIRUBIN TOTAL: 0.7 mg/dL (ref 0.3–1.2)
BUN: 15 mg/dL (ref 6–20)
CO2: 26 mmol/L (ref 22–32)
CREATININE: 1.13 mg/dL (ref 0.61–1.24)
Calcium: 9.3 mg/dL (ref 8.9–10.3)
Chloride: 106 mmol/L (ref 101–111)
GFR calc Af Amer: >60 mL/min (ref >60)
GFR calc non Af Amer: >60 mL/min (ref >60)
Glucose, Bld: 98 mg/dL (ref 65–99)
Potassium: 4.3 mmol/L (ref 3.5–5.1)
Sodium: 140 mmol/L (ref 135–145)
TOTAL PROTEIN: 7.1 g/dL (ref 6.5–8.1)

## 2014-10-07 LAB — DIFFERENTIAL
Basophils Absolute: 0 10*3/uL (ref 0.0–0.1)
Basophils Relative: 0 % (ref 0–1)
EOS PCT: 1 % (ref 0–5)
Eosinophils Absolute: 0.1 10*3/uL (ref 0.0–0.7)
LYMPHS ABS: 0.9 10*3/uL (ref 0.7–4.0)
LYMPHS PCT: 18 % (ref 12–46)
Monocytes Absolute: 0.6 10*3/uL (ref 0.1–1.0)
Monocytes Relative: 12 % (ref 3–12)
NEUTROS ABS: 3.6 10*3/uL (ref 1.7–7.7)
NEUTROS PCT: 69 % (ref 43–77)

## 2014-10-07 LAB — I-STAT TROPONIN, ED: Troponin i, poc: 0 ng/mL (ref 0.00–0.08)

## 2014-10-07 LAB — APTT: aPTT: 30 seconds (ref 24–37)

## 2014-10-07 MED ORDER — LORAZEPAM 2 MG/ML IJ SOLN
1.0000 mg | Freq: Once | INTRAMUSCULAR | Status: DC | PRN
  Administered 2014-10-07: 1 mg via INTRAVENOUS
  Filled 2014-10-07: qty 1

## 2014-10-07 MED ORDER — GADOBENATE DIMEGLUMINE 529 MG/ML IV SOLN
19.0000 mL | Freq: Once | INTRAVENOUS | Status: AC | PRN
  Administered 2014-10-07: 19 mL via INTRAVENOUS

## 2014-10-07 NOTE — ED Notes (Signed)
Patient states he got new glasses 2 months ago and has been having blurry vision "since I got those glasses." Family member states this has only been going on x 1 week. States "he has gotten where he is scared to drive this past week so I thought he needed to get checked out."

## 2014-10-07 NOTE — ED Notes (Signed)
Gave pt meal tray with MD Tomi Bamberger approval.

## 2014-10-07 NOTE — ED Provider Notes (Signed)
CSN: [id number]     Arrival date & time 10/07/14  1443 History  This chart was scribed for Dorie Rank, MD by Hilda Lias, ED Scribe. This patient was seen in room APA06/APA06 and the patient's care was started at 10:36 AM.    Chief Complaint  Patient presents with  . Blurred Vision     The history is provided by the patient. No language interpreter was used.     HPI Comments: Jared Gill is a 74 y.o. male who presents to the Emergency Department complaining of constant, worsening blurred vision with associated difficulty walking and tremors that has been present for a few months. Pt states he got new glasses 2 months ago and notes he has had double vision and blurry vision since he got the new glasses. Pt reports the past couple of days he has had worsening symptoms, and his son notes that he has been shaking and has trouble walking as well. Pt reports that this morning while walking out of church he stumbled and nearly fell. Pt denies weakness, diarrhea, vomiting.    Past Medical History  Diagnosis Date  . Hypertension   . Abnormal MRI, spine 05/12/2011  . Nausea 01/2014  . BPH (benign prostatic hypertrophy)   . Hx of adenomatous colonic polyps     h/o tubulovillous adenomas  . Diverticulitis    Past Surgical History  Procedure Laterality Date  . Cholecystectomy  2009  . Polyp removal via colonoscopy    . Colonoscopy with esophagogastroduodenoscopy (egd) N/A 04/06/2013    [id number] size internal hemorrhoids/moderate diverticulosis/medium sized HH   Family History  Problem Relation Age of Onset  . Diabetes Father   . Diabetes Sister   . Cancer Brother   . Colon cancer Neg Hx    Social History  Substance Use Topics  . Smoking status: Former Smoker -- 0.50 packs/day for 40 years    Types: Cigarettes  . Smokeless tobacco: Never Used     Comment: Quit x 2 years  . Alcohol Use: No     Comment: occasional beer    Review of Systems  Eyes: Positive for visual  disturbance.  Gastrointestinal: Negative for vomiting and diarrhea.  Musculoskeletal: Positive for gait problem.  Neurological: Positive for tremors. Negative for weakness.  All other systems reviewed and are negative.     Allergies  Reglan  Home Medications   Prior to Admission medications   Medication Sig Start Date End Date Taking? Authorizing Provider  amLODipine (NORVASC) 5 MG tablet Take 5 mg by mouth daily.    Historical Provider, MD  calcium carbonate (TUMS - DOSED IN MG ELEMENTAL CALCIUM) 500 MG chewable tablet Chew 1 tablet by mouth daily as needed for indigestion or heartburn.    Historical Provider, MD  dicyclomine (BENTYL) 10 MG capsule Take 10 mg by mouth 2 (two) times daily.    Historical Provider, MD  doxazosin (CARDURA) 2 MG tablet Take 2 mg by mouth daily.    Historical Provider, MD  HYDROcodone-acetaminophen (NORCO/VICODIN) 5-325 MG per tablet Take 1 tablet by mouth every 6 (six) hours as needed. Patient not taking: Reported on 08/22/2014 04/30/14   Fredia Sorrow, MD  Linaclotide [facility]) 145 MCG CAPS capsule 1 PO WITH BREAKFAST 04/17/14   Danie Binder, MD  LORazepam (ATIVAN) 1 MG tablet Take 1 tablet (1 mg total) by mouth 2 (two) times daily. 05/09/14   Danie Binder, MD  mirtazapine (REMERON) 15 MG tablet Take 1 tablet (15 mg total)  by mouth at bedtime. Patient not taking: Reported on 08/22/2014 05/09/14   Danie Binder, MD  Multiple Vitamin (MULTIVITAMIN) capsule Take 1 capsule by mouth daily.    Historical Provider, MD  omeprazole (PRILOSEC) 20 MG capsule 1 PO 30 MINS PRIOR TO BREAKFAST AND AT BEDTIME 08/22/14   Danie Binder, MD  ondansetron (ZOFRAN ODT) 4 MG disintegrating tablet Take 1 tablet (4 mg total) by mouth every 6 (six) hours as needed for nausea. Patient not taking: Reported on 08/22/2014 05/09/14   Danie Binder, MD  ondansetron (ZOFRAN) 4 MG tablet 1 PO AT BEDTIME TO PREVENT NAUSEA FIRST THING IN THE MORNING 08/22/14   Danie Binder, MD  polyethylene  glycol powder (GLYCOLAX/MIRALAX) powder Take 17 g by mouth daily as needed (for constipation).     Historical Provider, MD  tetrahydrozoline 0.05 % ophthalmic solution Place 1 drop into both eyes daily as needed (redness relief).    Historical Provider, MD   BP 132/74 mmHg  Pulse 60  Temp(Src) 98 F (36.7 C) (Oral)  Resp 18  Ht '6\' 2"'$  (1.88 m)  Wt 200 lb (90.719 kg)  BMI 25.67 kg/m2  SpO2 98% Physical Exam  Constitutional: He is oriented to person, place, and time. He appears well-developed and well-nourished. No distress.  HENT:  Head: Normocephalic and atraumatic.  Right Ear: External ear normal.  Left Ear: External ear normal.  Mouth/Throat: Oropharynx is clear and moist.  Eyes: Conjunctivae are normal. Right eye exhibits no discharge. Left eye exhibits no discharge. No scleral icterus.  Neck: Neck supple. No tracheal deviation present.  Cardiovascular: Normal rate, regular rhythm and intact distal pulses.   Pulmonary/Chest: Effort normal and breath sounds normal. No stridor. No respiratory distress. He has no wheezes. He has no rales.  Abdominal: Soft. Bowel sounds are normal. He exhibits no distension. There is no tenderness. There is no rebound and no guarding.  Musculoskeletal: He exhibits no edema or tenderness.  Neurological: He is alert and oriented to person, place, and time. He has normal strength. No cranial nerve deficit (No facial droop, extraocular movements intact, tongue midline ) or sensory deficit. He exhibits normal muscle tone. He displays no seizure activity. Coordination normal.  No pronator drift bilateral upper extrem, able to hold both legs off bed for 5 seconds, sensation intact in all extremities, no visual field cuts, no left or right sided neglect, abnormal finger-nose exam, no nystagmus noted   Skin: Skin is warm and dry. No rash noted.  Psychiatric: He has a normal mood and affect.  Nursing note and vitals reviewed.   ED Course  Procedures (including  critical care time)  DIAGNOSTIC STUDIES: Oxygen Saturation is 99% on room air, normal by my interpretation.    COORDINATION OF CARE: 10:42 AM Discussed treatment plan with pt at bedside and pt agreed to plan.   Labs Review Labs Reviewed  CBC - Abnormal; Notable for the following:    MCV 72.3 (*)    MCH 23.4 (*)    RDW 16.1 (*)    All other components within normal limits  COMPREHENSIVE METABOLIC PANEL - Abnormal; Notable for the following:    ALT 16 (*)    All other components within normal limits  PROTIME-INR  APTT  DIFFERENTIAL  Randolm Idol, ED    Imaging Review Mr Jeri Cos Wo Contrast  10/07/2014   CLINICAL DATA:  Weakness and double vision, 2 days duration.  EXAM: MRI HEAD WITHOUT AND WITH CONTRAST  TECHNIQUE: Multiplanar,  multiecho pulse sequences of the brain and surrounding structures were obtained without and with intravenous contrast.  CONTRAST:  15m MULTIHANCE GADOBENATE DIMEGLUMINE 529 MG/ML IV SOLN  COMPARISON:  None.  FINDINGS: There is a 12 mm ring-enhancing lesion within the midbrain/cerebral peduncle on the right with surrounding edema. No restricted diffusion. No second lesion seen elsewhere.  Diffusion imaging does not show acute or subacute infarction. No cerebellar abnormality. The cerebral hemispheres show mild age related volume loss without evidence of old small or large vessel infarction. No hydrocephalus or extra-axial collection. No pituitary mass. No skull or skullbase lesion. There are some inflammatory changes affecting the maxillary sinuses.  IMPRESSION: 12 mm ring-enhancing lesion in the right midbrain/cerebral peduncle with surrounding edema. 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 The differential diagnosis does include primary brain tumor and abscess, but those are less likely. Solitary demyelinating lesion is least likely   Electronically Signed   By: MNelson ChimesM.D.   On: 10/07/2014 12:23      EKG Interpretation   Date/Time:  Monday  October 07 2014 11:00:37 EDT Ventricular Rate:  61 PR Interval:  174 QRS Duration: 101 QT Interval:  423 QTC Calculation: 426 R Axis:   39 Text Interpretation:  Sinus rhythm Baseline wander in lead(s) V5 No  significant change since last tracing Confirmed by Myeesha Shane  MD-J, Ronnetta Currington  [id number] on 10/07/2014 1:31:18 PM      MDM   Final diagnoses:  Brain tumor  Discussed findings with the patient and the fact that cancer is a concern.  Explained to him the need for further evaluation.  I discussed the case with Dr CSaintclair Halsted  He does not feel this lesion is amenable to biopsy.   Recommends further medical evaluation, oncologic work up.  Discussed with Dr FWilley Bladeto notify him of the results.  He will arrange for further evaluation.   I personally performed the services described in this documentation, which was scribed in my presence.  The recorded information has been reviewed and is accurate.    JDorie Rank MD 10/07/14 [phone]

## 2014-10-08 ENCOUNTER — Other Ambulatory Visit (HOSPITAL_COMMUNITY): Payer: Self-pay | Admitting: Internal Medicine

## 2014-10-08 DIAGNOSIS — G939 Disorder of brain, unspecified: Secondary | ICD-10-CM

## 2014-10-08 DIAGNOSIS — R6889 Other general symptoms and signs: Secondary | ICD-10-CM

## 2014-10-10 ENCOUNTER — Ambulatory Visit (HOSPITAL_COMMUNITY)
Admission: RE | Admit: 2014-10-10 | Discharge: 2014-10-10 | Disposition: A | Discharge status: Home / Self Care | Payer: Commercial Managed Care - HMO | Source: Ambulatory Visit | Attending: Internal Medicine | Admitting: Internal Medicine

## 2014-10-10 DIAGNOSIS — R6889 Other general symptoms and signs: Secondary | ICD-10-CM

## 2014-10-10 DIAGNOSIS — G939 Disorder of brain, unspecified: Secondary | ICD-10-CM | POA: Insufficient documentation

## 2014-10-10 DIAGNOSIS — R918 Other nonspecific abnormal finding of lung field: Secondary | ICD-10-CM | POA: Insufficient documentation

## 2014-10-10 DIAGNOSIS — R930 Abnormal findings on diagnostic imaging of skull and head, not elsewhere classified: Secondary | ICD-10-CM | POA: Diagnosis not present

## 2014-10-10 DIAGNOSIS — K5730 Diverticulosis of large intestine without perforation or abscess without bleeding: Secondary | ICD-10-CM | POA: Insufficient documentation

## 2014-10-10 LAB — POCT I-STAT CREATININE: CREATININE: 1 mg/dL (ref 0.61–1.24)

## 2014-10-10 MED ORDER — IOHEXOL 300 MG/ML  SOLN
100.0000 mL | Freq: Once | INTRAMUSCULAR | Status: AC | PRN
  Administered 2014-10-10: 100 mL via INTRAVENOUS

## 2014-10-14 ENCOUNTER — Encounter (HOSPITAL_COMMUNITY): Payer: Self-pay | Admitting: Hematology & Oncology

## 2014-10-14 ENCOUNTER — Ambulatory Visit (HOSPITAL_COMMUNITY): Payer: Self-pay

## 2014-10-14 ENCOUNTER — Encounter (HOSPITAL_COMMUNITY): Payer: Commercial Managed Care - HMO | Attending: Hematology & Oncology | Admitting: Hematology & Oncology

## 2014-10-14 ENCOUNTER — Ambulatory Visit (HOSPITAL_COMMUNITY): Payer: Self-pay | Admitting: Hematology & Oncology

## 2014-10-14 VITALS — BP 134/73 | HR 87 | Temp 97.9°F | Resp 16 | Wt 193.6 lb

## 2014-10-14 DIAGNOSIS — H532 Diplopia: Secondary | ICD-10-CM | POA: Diagnosis not present

## 2014-10-14 DIAGNOSIS — G939 Disorder of brain, unspecified: Secondary | ICD-10-CM

## 2014-10-14 DIAGNOSIS — R634 Abnormal weight loss: Secondary | ICD-10-CM

## 2014-10-14 DIAGNOSIS — H538 Other visual disturbances: Secondary | ICD-10-CM | POA: Diagnosis not present

## 2014-10-14 DIAGNOSIS — F419 Anxiety disorder, unspecified: Secondary | ICD-10-CM

## 2014-10-14 DIAGNOSIS — Z809 Family history of malignant neoplasm, unspecified: Secondary | ICD-10-CM

## 2014-10-14 DIAGNOSIS — R9389 Abnormal findings on diagnostic imaging of other specified body structures: Secondary | ICD-10-CM

## 2014-10-14 DIAGNOSIS — Z87891 Personal history of nicotine dependence: Secondary | ICD-10-CM

## 2014-10-14 DIAGNOSIS — R918 Other nonspecific abnormal finding of lung field: Secondary | ICD-10-CM | POA: Diagnosis not present

## 2014-10-14 MED ORDER — DEXAMETHASONE 4 MG PO TABS: 8.0000 mg | ORAL_TABLET | Freq: Three times a day (TID) | ORAL | Status: DC

## 2014-10-14 NOTE — Progress Notes (Signed)
[facility] [place] NOTE  Patient Care Team: Jared Noble, MD as PCP - General Jared Binder, MD as Consulting Physician (Gastroenterology)  CHIEF COMPLAINTS/PURPOSE OF CONSULTATION:  Abnormal CT imaging of the chest, Abnormal Brain MRI   HISTORY OF PRESENTING ILLNESS:  Jared Gill 74 y.o. male is here because of new onset blurred vision. He originally attributed these vision changes to new glasses. He notes he went back to his eye doctor and was ultimately referred to his primary care physician and on for a brain MRI. He is really not certain what is going on. He states he knows that something is wrong but not sure what. Since December he has lost approximately 36 pounds although he cannot tell.  The patient is here today with his two brothers. Both brothers live nearby, one is two blocks down from Jared Gill and the other lives on the other side of Jared [place].  He feels all right and strong. He goes to sleep early. He denies falling or stumbling. He denies headaches. He denies weakness in his arms or legs.  His appetite "got slow" due to "diverticulitis." He saw Jared Gill in regards to his diverticulitis. He has not been back in a few months.  His only complaint is blurry vision and double vision. He notes that if he closes one eye his vision is better. He denies falling although at times he stumbles because of his visual difficulties. He denies headaches.  He was curious if he could have something for his nerves. He is currently prescribed Ativan.  The patient's brother is aware that there is a concern he may have cancer that has "spread to the brain. He states they understand his chest CT is abnormal as well as his brain MRI. They are here today to discuss the results of these studies and for additional recommendations.  MEDICAL HISTORY:  Past Medical History  Diagnosis Date  . Hypertension   . Abnormal MRI, spine 05/12/2011  . Nausea 01/2014  . BPH  (benign prostatic hypertrophy)   . Hx of adenomatous colonic polyps     h/o tubulovillous adenomas  . Diverticulitis     SURGICAL HISTORY: Past Surgical History  Procedure Laterality Date  . Cholecystectomy  2009  . Polyp removal via colonoscopy    . Colonoscopy with esophagogastroduodenoscopy (egd) N/A 04/06/2013    DVV:OHYWVPXT size internal hemorrhoids/moderate diverticulosis/medium sized HH    SOCIAL HISTORY: Social History   Social History  . Marital Status: Single    Spouse Name: N/A  . Number of Children: N/A  . Years of Education: N/A   Occupational History  . Not on file.   Social History Main Topics  . Smoking status: Former Smoker -- 0.50 packs/day for 40 years    Types: Cigarettes  . Smokeless tobacco: Never Used     Comment: Quit x 2 years  . Alcohol Use: No     Comment: occasional beer  . Drug Use: No  . Sexual Activity: Not on file   Other Topics Concern  . Not on file   Social History Narrative  Single, never married. No children. Lives in [place]. Ex smoker, began at an early age. Quit for over 2 years. ETOH, previously drank "not a whole lot". He worked at [employer] for 35 years.  FAMILY HISTORY: Family History  Problem Relation Age of Onset  . Diabetes Father   . Diabetes Sister   . Cancer Brother   .  Colon cancer Neg Hx    indicated that his mother is deceased. He indicated that his father is deceased. He indicated that his sister is deceased.  Mother died at 14 yo. Alzheimer's. Father died at 53 yo. He fell and hurt his head. 3 brothers. 1 sister. Sister is deceased. She had diabetes.  ALLERGIES:  is allergic to reglan.  MEDICATIONS:  Current Outpatient Prescriptions  Medication Sig Dispense Refill  . amLODipine (NORVASC) 5 MG tablet Take 5 mg by mouth daily.    . calcium carbonate (TUMS - DOSED IN MG ELEMENTAL CALCIUM) 500 MG chewable tablet Chew 1 tablet by mouth daily as needed for indigestion or heartburn.    .  dicyclomine (BENTYL) 10 MG capsule Take 10 mg by mouth 2 (two) times daily.    [name] Kitchen doxazosin (CARDURA) 2 MG tablet Take 2 mg by mouth daily.    [name] Kitchen HYDROcodone-acetaminophen (NORCO/VICODIN) 5-325 MG per tablet Take 1 tablet by mouth every 6 (six) hours as needed. (Patient not taking: Reported on 08/22/2014) 20 tablet 0  . Linaclotide (LINZESS) 145 MCG CAPS capsule 1 PO WITH BREAKFAST 30 capsule 11  . LORazepam (ATIVAN) 1 MG tablet Take 1 tablet (1 mg total) by mouth 2 (two) times daily. 60 tablet 0  . mirtazapine (REMERON) 15 MG tablet Take 1 tablet (15 mg total) by mouth at bedtime. (Patient not taking: Reported on 08/22/2014) 90 tablet 3  . Multiple Vitamin (MULTIVITAMIN) capsule Take 1 capsule by mouth daily.    [name] Kitchen omeprazole (PRILOSEC) 20 MG capsule 1 PO 30 MINS PRIOR TO BREAKFAST AND AT BEDTIME 180 capsule 3  . ondansetron (ZOFRAN ODT) 4 MG disintegrating tablet Take 1 tablet (4 mg total) by mouth every 6 (six) hours as needed for nausea. (Patient not taking: Reported on 08/22/2014) 30 tablet 3  . ondansetron (ZOFRAN) 4 MG tablet 1 PO AT BEDTIME TO PREVENT NAUSEA FIRST THING IN THE MORNING 90 tablet 3  . polyethylene glycol powder (GLYCOLAX/MIRALAX) powder Take 17 g by mouth daily as needed (for constipation).     [name] Kitchen tetrahydrozoline 0.05 % ophthalmic solution Place 1 drop into both eyes daily as needed (redness relief).     No current facility-administered medications for this visit.    Review of Systems  Constitutional: Positive for weight loss. Negative for fever, chills, malaise/fatigue and diaphoresis.  HENT: Negative for congestion, ear discharge, ear pain, hearing loss, nosebleeds, sore throat and tinnitus.   Eyes: Positive for blurred vision and double vision. Negative for photophobia and pain.       Visual changes are mostly blurry vision, with some double vision noted.  Respiratory: Negative.  Negative for stridor.   Cardiovascular: Negative.   Gastrointestinal: Negative.     Genitourinary: Positive for frequency.       Nocturia  Musculoskeletal: Positive for joint pain. Negative for myalgias, back pain, falls and neck pain.  Skin: Negative.   Neurological: Negative for dizziness, tingling, tremors, sensory change, speech change, focal weakness, seizures, loss of consciousness, weakness and headaches.  Endo/Heme/Allergies: Negative.   Psychiatric/Behavioral: Negative for depression, suicidal ideas and memory loss. The patient is nervous/anxious. The patient does not have insomnia.   All other systems reviewed and are negative.  14 point ROS was done and is otherwise as detailed above or in HPI   PHYSICAL EXAMINATION: ECOG PERFORMANCE STATUS: 1 - Symptomatic but completely ambulatory  There were no vitals filed for this visit. There were no vitals filed for this visit.   Physical Exam  Constitutional: He is oriented to person, place, and time and well-developed, well-nourished, and in no distress.  Needs assistance onto the exam table  HENT:  Head: Normocephalic and atraumatic.  Nose: Nose normal.  Mouth/Throat: Oropharynx is clear and moist.  Eyes: Conjunctivae and EOM are normal. Pupils are equal, round, and reactive to light.  Neck: Normal range of motion. Neck supple. No tracheal deviation present. No thyromegaly present.  Cardiovascular: Normal rate, regular rhythm and normal heart sounds.   No murmur heard. Pulmonary/Chest: Effort normal and breath sounds normal. No respiratory distress. He has no wheezes. He has no rales. He exhibits no tenderness.  Abdominal: Soft. Bowel sounds are normal. He exhibits no distension and no mass. There is no tenderness. There is no rebound and no guarding.  Musculoskeletal: Normal range of motion. He exhibits no edema or tenderness.  Lymphadenopathy:    He has no cervical adenopathy.  Neurological: He is alert and oriented to person, place, and time. He displays normal reflexes. No cranial nerve deficit. He  exhibits normal muscle tone.  Rapid hand movements on the left slightly abnormal. Unsteady gait.  Skin: Skin is warm and dry. No rash noted. No erythema. No pallor.  Psychiatric: Mood and affect normal.  Nursing note and vitals reviewed.    LABORATORY DATA:  I have reviewed the data as listed Lab Results  Component Value Date   WBC 5.2 10/07/2014   HGB 13.0 10/07/2014   HCT 40.2 10/07/2014   MCV 72.3* 10/07/2014   PLT 199 10/07/2014   CMP     Component Value Date/Time   NA 140 10/07/2014 1106   K 4.3 10/07/2014 1106   CL 106 10/07/2014 1106   CO2 26 10/07/2014 1106   GLUCOSE 98 10/07/2014 1106   BUN 15 10/07/2014 1106   CREATININE 1.00 10/10/2014 1430   CREATININE 1.00 02/21/2014 1323   CALCIUM 9.3 10/07/2014 1106   PROT 7.1 10/07/2014 1106   ALBUMIN 4.0 10/07/2014 1106   AST 20 10/07/2014 1106   ALT 16* 10/07/2014 1106   ALKPHOS 66 10/07/2014 1106   BILITOT 0.7 10/07/2014 1106   GFRNONAA >60 10/07/2014 1106   GFRAA >60 10/07/2014 1106     RADIOGRAPHIC STUDIES: I have personally reviewed the radiological images as listed and agreed with the findings in the report.  Ct Chest W Contrast  10/10/2014   CLINICAL DATA:  Brain lesion on recent MRI  EXAM: CT CHEST, ABDOMEN, AND PELVIS WITH CONTRAST  TECHNIQUE: Multidetector CT imaging of the chest, abdomen and pelvis was performed following the standard protocol during bolus administration of intravenous contrast.  CONTRAST:  127m OMNIPAQUE IOHEXOL 300 MG/ML  SOLN  COMPARISON:  CT scan abdomen and pelvis 04/29/2014 and CT scan of the chest 09/20/2012  FINDINGS: CT CHEST FINDINGS    IMPRESSION: 1. There is a right upper lobe/right mediastinal mass measures at least 2.5 x 3 x 4 cm. This is highly suspicious for primary malignancy. Pathologic lymph nodes are noted precarinal region and right hilum. Nodular suprahilar lesion right upper lobe suspicious for satellite lesion. A nodule in right middle lobe measures 1 cm suspicious  for metastatic disease. No left hilar adenopathy. No left lung lesions. 2. No acute infiltrate or pulmonary edema. Bilateral mild emphysematous changes. 3. Extensive atherosclerotic calcifications of abdominal aorta. 4. No evidence of metastatic disease within abdomen. 5. Abundant stool noted within cecum.  No pericecal inflammation. 6. Multiple colonic diverticula descending colon and sigmoid colon. No evidence of acute diverticulitis. 7. Unremarkable  prostate gland and urinary bladder. 8. Small nonspecific bilateral inguinal lymph nodes.  These results were called by telephone at the time of interpretation on 10/10/2014 at 4:26 pm to Dr. Asencion Gill , who verbally acknowledged these results.   Electronically Signed   By: Lahoma Crocker M.D.   On: 10/10/2014 16:27   MRI examination of the brain  IMPRESSION: 12 mm ring-enhancing lesion in the right midbrain/cerebral peduncle with surrounding edema. 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 The differential diagnosis does include primary brain tumor and abscess, but those are less likely. Solitary demyelinating lesion is least likely   Electronically Signed  By: Nelson Chimes M.D.  On: 10/07/2014 12:23   ASSESSMENT & PLAN:  Abnormal CT imaging of the Chest History of tobacco abuse, quitting in 2014 Abnormal MRI brain with 12 mm ring-enhancing lesion Double/blurry vision Weight loss  I discussed my concerns about his imaging studies with the patient and his family. His brothers seemed to have a fairly good understanding that the patient may have a primary cancer with brain metastases. We discussed the possibility he may have a primary lung cancer.  I have discussed his case with Dr. Isidore Moos. She will see the patient at [facility] on Friday. I have arranged for a PET/CT. I am going to see based on his chest CT if cardiothoracic surgery can easily access the lung lesion for biopsy. Radiation will not be pursued of course until a final diagnosis is  obtained. I have called him in a prescription for dexamethasone 8 mg 3 times a day. The patient will pick this up at [facility].    We discussed our plans moving forward. I emphasized the importance of appropriate tissue diagnosis. I advised the patient if his dizziness worsens or he develops headaches, he is to notify our office or present to the emergency department. Additional treatment plans and recommendations will be made once a diagnosis is obtained.  Orders Placed This Encounter  Procedures  . NM PET Image Initial (PI) Skull Base To Thigh    Standing Status: Future     Number of Occurrences:      Standing Expiration Date: 10/14/2015    Order Specific Question:  Reason for Exam (SYMPTOM  OR DIAGNOSIS REQUIRED)    Answer:  abnormal CT chest, brain metastases, staging    Order Specific Question:  Preferred imaging location?    Answer:  [facility]     All questions were answered. The patient knows to call the clinic with any problems, questions or concerns.  This note was electronically signed.    This document serves as a record of services personally performed by Ancil Linsey, MD. It was created on her behalf by Arlyce Harman, a trained medical scribe. The creation of this record is based on the scribe's personal observations and the provider's statements to them. This document has been checked and approved by the attending provider.  I have reviewed the above documentation for accuracy and completeness, and I agree with the above. Molli Hazard, MD  10/14/2014 1:12 PM

## 2014-10-14 NOTE — Patient Instructions (Addendum)
[place] at [facility] Discharge Instructions  RECOMMENDATIONS MADE BY THE CONSULTANT AND ANY TEST RESULTS WILL BE SENT TO YOUR REFERRING PHYSICIAN.  Exam and discussion by Dr. Youlanda Roys may have lung cancer that has spread to your brain.  Will put you on some medication to help with your blurred vision and nausea. Dexamethasone 4 mg tablets, take 2 tablets 3 times daily  Referral to Dr. Lanell Persons at [facility] on Friday 8/26 at 12:30 pm.  Will get PET Scan at [facility] on 8/29 at 1:30 pm.  Do not eat or drink anything 6 hours prior to the scan.  Nothing with sugar in it.  No candy, chewing gum, etc.  Will make a referral to Thoracic surgeon to see if they can get a biopsy of the lesion in your lung so we will know exactly what we are dealing with.  Follow-up here after PET scan on 10/22/14 at 10:40 am.     Thank you for choosing [place] at [facility] to provide your oncology and hematology care.  To afford each patient quality time with our provider, please arrive at least 15 minutes before your scheduled appointment time.    You need to re-schedule your appointment should you arrive 10 or more minutes late.  We strive to give you quality time with our providers, and arriving late affects you and other patients whose appointments are after yours.  Also, if you no show three or more times for appointments you may be dismissed from the clinic at the providers discretion.     Again, thank you for choosing [facility].  Our hope is that these requests will decrease the amount of time that you wait before being seen by our physicians.       _____________________________________________________________  Should you have questions after your visit to [facility], please contact our office at [phone] between the hours of 8:30 a.m. and 4:30 p.m.  Voicemails left  after 4:30 p.m. will not be returned until the following business day.  For prescription refill requests, have your pharmacy contact our office.

## 2014-10-15 ENCOUNTER — Ambulatory Visit (HOSPITAL_COMMUNITY): Payer: Self-pay | Admitting: Hematology & Oncology

## 2014-10-16 NOTE — Progress Notes (Addendum)
Location/Histology of Brain Tumor: 10m ring enhancing lesion w/i midbrain/cerebral peduncle on the right with surrounding edema  Patient presented with symptoms of:  New onset blurred vision and double vision.  Patient reports that 3 weeks ago that he felt his vision was blurry and thought it was his glasses.  He went to his eye doctor and was told to see his primary care doctor.  He went to the ER and an MRI was done.  Past or anticipated interventions, if any, per neurosurgery: n/a  Past or anticipated interventions, if any, per medical oncology: n/a  Dose of Decadron, if applicable: yes- 4 mg tablet, take 2 tables (8 mg total) by mouth 3 times a day.    Recent neurologic symptoms, if any:   Seizures: NO  Headaches:no  Nausea:  No but  Weight loss of approximately 36 lbs  Dizziness/ataxia: unsteady gait   Difficulty with hand coordination: Rapid hand movements on the left slightly abnormal  Focal numbness/weakness: no  Visual deficits/changes: yes- blurry vision and double vision.  He reports his vision is better when he closes his right eye.  Confusion/Memory deficits: NO  Painful bone metastases at present, if any: no  SAFETY ISSUES:  Prior radiation? NO  Pacemaker/ICD? NO  Is the patient on methotrexate? NO  Additional Complaints / other details: Single no children,ex smoker quit 5 years ago, ETOH,previously drank"not a whole lot" worked [employer]years, Mother deceased alzheimer's.813 Father DM, deceased age 74 Brother lung cancer, sister   Patient is here with his nephew.  BP 135/63 mmHg  Pulse 75  Temp(Src) 98.3 F (36.8 C) (Oral)  Resp 16  Ht '6\' 2"'$  (1.88 m)  Wt 196 lb 9.6 oz (89.177 kg)  BMI 25.23 kg/m2  SpO2 99%  Allergies:Reglan,

## 2014-10-18 ENCOUNTER — Encounter: Payer: Self-pay | Admitting: Radiation Oncology

## 2014-10-18 ENCOUNTER — Ambulatory Visit
Admission: RE | Admit: 2014-10-18 | Discharge: 2014-10-18 | Disposition: A | Discharge status: Home / Self Care | Payer: Commercial Managed Care - HMO | Source: Ambulatory Visit | Attending: Radiation Oncology | Admitting: Radiation Oncology

## 2014-10-18 VITALS — BP 135/63 | HR 75 | Temp 98.3°F | Resp 16 | Ht 74.0 in | Wt 196.6 lb

## 2014-10-18 DIAGNOSIS — Z833 Family history of diabetes mellitus: Secondary | ICD-10-CM | POA: Diagnosis not present

## 2014-10-18 DIAGNOSIS — Z801 Family history of malignant neoplasm of trachea, bronchus and lung: Secondary | ICD-10-CM | POA: Insufficient documentation

## 2014-10-18 DIAGNOSIS — C801 Malignant (primary) neoplasm, unspecified: Secondary | ICD-10-CM | POA: Insufficient documentation

## 2014-10-18 DIAGNOSIS — H532 Diplopia: Secondary | ICD-10-CM | POA: Diagnosis not present

## 2014-10-18 DIAGNOSIS — N400 Enlarged prostate without lower urinary tract symptoms: Secondary | ICD-10-CM | POA: Insufficient documentation

## 2014-10-18 DIAGNOSIS — Z87891 Personal history of nicotine dependence: Secondary | ICD-10-CM | POA: Diagnosis not present

## 2014-10-18 DIAGNOSIS — F40240 Claustrophobia: Secondary | ICD-10-CM | POA: Diagnosis not present

## 2014-10-18 DIAGNOSIS — C7931 Secondary malignant neoplasm of brain: Secondary | ICD-10-CM | POA: Insufficient documentation

## 2014-10-18 DIAGNOSIS — I10 Essential (primary) hypertension: Secondary | ICD-10-CM | POA: Insufficient documentation

## 2014-10-18 DIAGNOSIS — Z510 Encounter for antineoplastic radiation therapy: Secondary | ICD-10-CM | POA: Insufficient documentation

## 2014-10-18 DIAGNOSIS — R531 Weakness: Secondary | ICD-10-CM | POA: Insufficient documentation

## 2014-10-18 MED ORDER — LORAZEPAM 1 MG PO TABS: ORAL_TABLET | ORAL | Status: DC

## 2014-10-18 NOTE — Progress Notes (Signed)
Radiation Oncology         [phone]) [phone] ________________________________  Initial Outpatient Consultation  Name: JEET SHOUGH MRN: [id number]  Date: 10/18/2014  DOB: Aug 07, 1940  XB:DZHGD,JME, MD  Whitney Muse, Kelby Fam, MD   REFERRING PHYSICIAN: Patrici Ranks, MD  DIAGNOSIS:    ICD-9-CM ICD-10-CM   1. Brain metastasis 198.3 C79.31 Ambulatory referral to Social Work     LORazepam (ATIVAN) 1 MG tablet    HISTORY OF PRESENT ILLNESS::Jared Gill is a 74 y.o. male who presented with a 29m ring enhancing lesion w/in midbrain/cerebral peduncle on the right with surrounding edema and abnormal CT of chest.  Patient presented with symptoms of: New onset blurred vision and double vision. Patient reports that 3 weeks ago that he felt his vision was blurry and thought it was his glasses. He went to his eye doctor and was told to see his primary care doctor. He went to the ER and a MRI was done.  Past or anticipated interventions, if any, per neurosurgery: n/a  Past or anticipated interventions, if any, per medical oncology: biopsy ordered of lung Dose of Decadron, if applicable: yes- 4 mg tablet, take 2 tables (8 mg total) by mouth 3 times a day.  Recent neurologic symptoms, if any:  Seizures: NO  Headaches:no  Nausea: No, but weight loss of approximately 36 lbs. Dizziness/ataxia: unsteady gait  Focal numbness/weakness: no  Visual deficits/changes: yes- blurry vision and double vision. He reports his vision is better when he closes his right eye.  Confusion/Memory deficits: denied  Additional Complaints / other details: Single. No children. Ex smoker and quit 5 years ago. ETOH, previously drank "not a whole lot." Worked at [employer] years.    Thoracic surgery consultation is pending to obtain biopsy. Patient presents to the clinic with his nephew. I have reviewed all of his recent imaging personally. Notable details include MRI of the brain on 10/07/14; this shows a 12 mm brain  enhancing lesion in the right midbrain/cerebral peduncle. There is surrounding edema. No other lesions are appreciated. Radiology feels that metastasis is the most likely diagnosis, but cannot rule out glioma or abscess. I reviewed his CT imaging of chest/ab/pelvis on 10/10/14; this demonstrates disease in the central right lung and right mediastinum. Small cell lung cancer is certainly suspicious, but non-small cell lung cancer is also a possibility.  PREVIOUS RADIATION THERAPY: No  PAST MEDICAL HISTORY:  has a past medical history of Hypertension; Abnormal MRI, spine (05/12/2011); Nausea (01/2014); BPH (benign prostatic hypertrophy); adenomatous colonic polyps; Diverticulitis; and Brain metastasis.    PAST SURGICAL HISTORY: Past Surgical History  Procedure Laterality Date  . Cholecystectomy  2009  . Polyp removal via colonoscopy    . Colonoscopy with esophagogastroduodenoscopy (egd) N/A 04/06/2013    SQAS:[id number] internal hemorrhoids/moderate diverticulosis/medium sized HH  . Tonsilectomy, adenoidectomy, bilateral myringotomy and tubes      FAMILY HISTORY: family history includes Diabetes in his father and sister; Lung cancer in his brother. There is no history of Colon cancer.  SOCIAL HISTORY:  reports that he quit smoking about 5 years ago. His smoking use included Cigarettes. He has a 20 pack-year smoking history. He has never used smokeless tobacco. He reports that he does not drink alcohol or use illicit drugs.  ALLERGIES: Reglan  MEDICATIONS:  Current Outpatient Prescriptions  Medication Sig Dispense Refill  . amLODipine (NORVASC) 5 MG tablet Take 5 mg by mouth daily.    . calcium carbonate (TUMS - DOSED  IN MG ELEMENTAL CALCIUM) 500 MG chewable tablet Chew 1 tablet by mouth daily as needed for indigestion or heartburn.    . dexamethasone (DECADRON) 4 MG tablet Take 2 tablets (8 mg total) by mouth 3 (three) times daily. 180 tablet 1  . dicyclomine (BENTYL) 10 MG capsule Take 10  mg by mouth 2 (two) times daily.    [name] Kitchen doxazosin (CARDURA) 2 MG tablet Take 2 mg by mouth daily.    [name] Kitchen LORazepam (ATIVAN) 1 MG tablet Take 1 tablet (1 mg total) by mouth 2 (two) times daily. 60 tablet 0  . Multiple Vitamin (MULTIVITAMIN) capsule Take 1 capsule by mouth daily.    [name] Kitchen omeprazole (PRILOSEC) 20 MG capsule 1 PO 30 MINS PRIOR TO BREAKFAST AND AT BEDTIME 180 capsule 3  . ondansetron (ZOFRAN) 4 MG tablet 1 PO AT BEDTIME TO PREVENT NAUSEA FIRST THING IN THE MORNING 90 tablet 3  . polyethylene glycol powder (GLYCOLAX/MIRALAX) powder Take 17 g by mouth daily as needed (for constipation).     [name] Kitchen tetrahydrozoline 0.05 % ophthalmic solution Place 1 drop into both eyes daily as needed (redness relief).    [name] Kitchen HYDROcodone-acetaminophen (NORCO/VICODIN) 5-325 MG per tablet Take 1 tablet by mouth every 6 (six) hours as needed. (Patient not taking: Reported on 08/22/2014) 20 tablet 0  . Linaclotide (LINZESS) 145 MCG CAPS capsule 1 PO WITH BREAKFAST (Patient not taking: Reported on 10/14/2014) 30 capsule 11  . LORazepam (ATIVAN) 1 MG tablet Take 1 tablet 30 minutes prior to MRI scan or prior to radiotherapy for anxiety/claustrophobia.  If absolutely needed, may take 2 tablets. 30 tablet 0  . mirtazapine (REMERON) 15 MG tablet Take 1 tablet (15 mg total) by mouth at bedtime. (Patient not taking: Reported on 08/22/2014) 90 tablet 3  . ondansetron (ZOFRAN ODT) 4 MG disintegrating tablet Take 1 tablet (4 mg total) by mouth every 6 (six) hours as needed for nausea. (Patient not taking: Reported on 10/14/2014) 30 tablet 3   No current facility-administered medications for this encounter.    REVIEW OF SYSTEMS:  Notable for that above. He denies headaches, seizures, weakness on either side of the body, numbness on either side of the body, and dizziness. He reports an unsteady gait and he decided not to drive anymore. Denies a prior diagnosis of cancer. He reports that he has blurred vision and diplopia. He states his  vision has not improved since taking Decadron and that the right eye is worse than the left eye. The pt reports that he is claustrophobic and had to be sedated for his MRI. He was given an Ativan '1mg'$  injection. Denies SOB/hemoptysis. The pt's nephew states that he noticed that the pt's hands have started to tremble a few weeks ago.  PHYSICAL EXAM:  height is '6\' 2"'$  (1.88 m) and weight is 196 lb 9.6 oz (89.177 kg). His oral temperature is 98.3 F (36.8 C). His blood pressure is 135/63 and his pulse is 75. His respiration is 16 and oxygen saturation is 99%.   General: Alert and oriented, in mild acute distress HEENT: Head is normocephalic. Extraocular movements are intact. Oropharynx is clear. Wears upper dentures. No thrush. Neck: Neck is supple, no palpable cervical or supraclavicular lymphadenopathy. Heart: Regular in rate and rhythm with no murmurs, rubs, or gallops. Chest: Clear to auscultation bilaterally, with no rhonchi, wheezes, or rales. Abdomen: Soft, nontender, nondistended, with no rigidity or guarding. Extremities: No cyanosis or edema. Lymphatics: see Neck Exam Skin: No concerning lesions. Musculoskeletal: symmetric strength  and muscle tone throughout. Neurologic: Cranial nerves II through XII are grossly intact. No obvious focalities. Speech is fluent. Finger to nose testing is performed with some difficulty. He has a subtle involuntary tremor of his hands bilaterally. Pupils are equally rounded and reactive to light. Psychiatric: Judgment and insight are intact. Affect is appropriate.  KPS = 70  100 - Normal; no complaints; no evidence of disease. 90   - Able to carry on normal activity; minor signs or symptoms of disease. 80   - Normal activity with effort; some signs or symptoms of disease. 35   - Cares for self; unable to carry on normal activity or to do active work. 60   - Requires occasional assistance, but is able to care for most of his personal needs. 50   - Requires  considerable assistance and frequent medical care. 36   - Disabled; requires special care and assistance. 82   - Severely disabled; hospital admission is indicated although death not imminent. 57   - Very sick; hospital admission necessary; active supportive treatment necessary. 10   - Moribund; fatal processes progressing rapidly. 0     - Dead  Karnofsky DA, Abelmann WH, Craver LS and Burchenal [facility] [phone]) The use of the nitrogen mustards in the palliative treatment of carcinoma: with particular reference to bronchogenic carcinoma Cancer 1 634-56  ECOG = 2  0 - Asymptomatic (Fully active, able to carry on all predisease activities without restriction)  1 - Symptomatic but completely ambulatory (Restricted in physically strenuous activity but ambulatory and able to carry out work of a light or sedentary nature. For example, light housework, office work)  2 - Symptomatic, <50% in bed during the day (Ambulatory and capable of all self care but unable to carry out any work activities. Up and about more than 50% of waking hours)  3 - Symptomatic, >50% in bed, but not bedbound (Capable of only limited self-care, confined to bed or chair 50% or more of waking hours)  4 - Bedbound (Completely disabled. Cannot carry on any self-care. Totally confined to bed or chair)  5 - Death   Eustace Pen MM, Creech RH, Tormey DC, et al. [phone]). "Toxicity and response criteria of the [facility]". [place] Oncol. 5 (6): 649-55   LABORATORY DATA:  Lab Results  Component Value Date   WBC 5.2 10/07/2014   HGB 13.0 10/07/2014   HCT 40.2 10/07/2014   MCV 72.3* 10/07/2014   PLT 199 10/07/2014   CMP     Component Value Date/Time   NA 140 10/07/2014 1106   K 4.3 10/07/2014 1106   CL 106 10/07/2014 1106   CO2 26 10/07/2014 1106   GLUCOSE 98 10/07/2014 1106   BUN 15 10/07/2014 1106   CREATININE 1.00 10/10/2014 1430   CREATININE 1.00 02/21/2014 1323   CALCIUM 9.3 10/07/2014 1106    PROT 7.1 10/07/2014 1106   ALBUMIN 4.0 10/07/2014 1106   AST 20 10/07/2014 1106   ALT 16* 10/07/2014 1106   ALKPHOS 66 10/07/2014 1106   BILITOT 0.7 10/07/2014 1106   GFRNONAA >60 10/07/2014 1106   GFRAA >60 10/07/2014 1106         RADIOGRAPHY: Ct Chest W Contrast  10/10/2014   CLINICAL DATA:  Brain lesion on recent MRI  EXAM: CT CHEST, ABDOMEN, AND PELVIS WITH CONTRAST  TECHNIQUE: Multidetector CT imaging of the chest, abdomen and pelvis was performed following the standard protocol during bolus administration of intravenous contrast.  CONTRAST:  119m OMNIPAQUE IOHEXOL 300 MG/ML  SOLN  COMPARISON:  CT scan abdomen and pelvis 04/29/2014 and CT scan of the chest 09/20/2012  FINDINGS: CT CHEST FINDINGS  Sagittal images of the spine shows mild degenerative changes thoracic spine. Sagittal images of the sternum are unremarkable.  Images of the thoracic inlet are unremarkable.  Central airways are patent. Mild thickening of mid esophageal wall. A precarinal lymph node measures 1.7 by 1.3 cm in diameter. There is a right hilar lymph node measures 1.4 by 1.7 cm.  There is a right upper lobe posterior medial lesion abutting/ close proximity with right paratracheal region right upper mediastinum. The lesion measures at least 2.5 x 3 cm. This is best seen in coronal image 65 measures at least 3.9 cm cranial caudally.  This is highly suspicious for primary malignancy. There is a second nodular density in right upper lobe sent is a centrally suprahilar region measures 1.4 cm suspicious for satellite lesion.  A nodule in right middle lobe measures 1 cm suspicious for metastatic disease. No left lung nodules are noted. Mild emphysematous changes are noted bilateral upper lobe. No acute infiltrate or pulmonary edema.  No destructive rib lesions are noted.  CT ABDOMEN AND PELVIS FINDINGS  Degenerative changes are noted lumbar spine. Atherosclerotic calcifications of abdominal aorta. No aortic aneurysm.  Enhanced liver  is unremarkable. Status postcholecystectomy. The pancreas, spleen and adrenal glands are unremarkable.  Kidneys are symmetrical in size and enhancement. No hydronephrosis or hydroureter.  Abundant stool noted within cecum. No pericecal inflammation. Normal appendix is noted in axial image 85. Multiple descending colon diverticula. Multiple sigmoid colon diverticula. No evidence of acute diverticulitis.  There is no small bowel obstruction. No ascites or free air. No adenopathy. Prostate gland and seminal vesicles are unremarkable. Small nonspecific inguinal lymph nodes are noted. Degenerative changes pubic symphysis.  IMPRESSION: 1. There is a right upper lobe/right mediastinal mass measures at least 2.5 x 3 x 4 cm. This is highly suspicious for primary malignancy. Pathologic lymph nodes are noted precarinal region and right hilum. Nodular suprahilar lesion right upper lobe suspicious for satellite lesion. A nodule in right middle lobe measures 1 cm suspicious for metastatic disease. No left hilar adenopathy. No left lung lesions. 2. No acute infiltrate or pulmonary edema. Bilateral mild emphysematous changes. 3. Extensive atherosclerotic calcifications of abdominal aorta. 4. No evidence of metastatic disease within abdomen. 5. Abundant stool noted within cecum.  No pericecal inflammation. 6. Multiple colonic diverticula descending colon and sigmoid colon. No evidence of acute diverticulitis. 7. Unremarkable prostate gland and urinary bladder. 8. Small nonspecific bilateral inguinal lymph nodes.  These results were called by telephone at the time of interpretation on 10/10/2014 at 4:26 pm to Dr. RAsencion Noble, who verbally acknowledged these results.   Electronically Signed   By: LLahoma CrockerM.D.   On: 10/10/2014 16:27   Mr BJeri CosWTKContrast  10/07/2014   CLINICAL DATA:  Weakness and double vision, 2 days duration.  EXAM: MRI HEAD WITHOUT AND WITH CONTRAST  TECHNIQUE: Multiplanar, multiecho pulse sequences of the  brain and surrounding structures were obtained without and with intravenous contrast.  CONTRAST:  141mMULTIHANCE GADOBENATE DIMEGLUMINE 529 MG/ML IV SOLN  COMPARISON:  None.  FINDINGS: There is a 12 mm ring-enhancing lesion within the midbrain/cerebral peduncle on the right with surrounding edema. No restricted diffusion. No second lesion seen elsewhere.  Diffusion imaging does not show acute or subacute infarction. No cerebellar abnormality. The cerebral hemispheres show mild  age related volume loss without evidence of old small or large vessel infarction. No hydrocephalus or extra-axial collection. No pituitary mass. No skull or skullbase lesion. There are some inflammatory changes affecting the maxillary sinuses.  IMPRESSION: 12 mm ring-enhancing lesion in the right midbrain/cerebral peduncle with surrounding edema. 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 The differential diagnosis does include primary brain tumor and abscess, but those are less likely. Solitary demyelinating lesion is least likely   Electronically Signed   By: Nelson Chimes M.D.   On: 10/07/2014 12:23   Ct Abdomen Pelvis W Contrast  10/10/2014   CLINICAL DATA:  Brain lesion on recent MRI  EXAM: CT CHEST, ABDOMEN, AND PELVIS WITH CONTRAST  TECHNIQUE: Multidetector CT imaging of the chest, abdomen and pelvis was performed following the standard protocol during bolus administration of intravenous contrast.  CONTRAST:  158m OMNIPAQUE IOHEXOL 300 MG/ML  SOLN  COMPARISON:  CT scan abdomen and pelvis 04/29/2014 and CT scan of the chest 09/20/2012  FINDINGS: CT CHEST FINDINGS  Sagittal images of the spine shows mild degenerative changes thoracic spine. Sagittal images of the sternum are unremarkable.  Images of the thoracic inlet are unremarkable.  Central airways are patent. Mild thickening of mid esophageal wall. A precarinal lymph node measures 1.7 by 1.3 cm in diameter. There is a right hilar lymph node measures 1.4 by 1.7 cm.  There is a  right upper lobe posterior medial lesion abutting/ close proximity with right paratracheal region right upper mediastinum. The lesion measures at least 2.5 x 3 cm. This is best seen in coronal image 65 measures at least 3.9 cm cranial caudally.  This is highly suspicious for primary malignancy. There is a second nodular density in right upper lobe sent is a centrally suprahilar region measures 1.4 cm suspicious for satellite lesion.  A nodule in right middle lobe measures 1 cm suspicious for metastatic disease. No left lung nodules are noted. Mild emphysematous changes are noted bilateral upper lobe. No acute infiltrate or pulmonary edema.  No destructive rib lesions are noted.  CT ABDOMEN AND PELVIS FINDINGS  Degenerative changes are noted lumbar spine. Atherosclerotic calcifications of abdominal aorta. No aortic aneurysm.  Enhanced liver is unremarkable. Status postcholecystectomy. The pancreas, spleen and adrenal glands are unremarkable.  Kidneys are symmetrical in size and enhancement. No hydronephrosis or hydroureter.  Abundant stool noted within cecum. No pericecal inflammation. Normal appendix is noted in axial image 85. Multiple descending colon diverticula. Multiple sigmoid colon diverticula. No evidence of acute diverticulitis.  There is no small bowel obstruction. No ascites or free air. No adenopathy. Prostate gland and seminal vesicles are unremarkable. Small nonspecific inguinal lymph nodes are noted. Degenerative changes pubic symphysis.  IMPRESSION: 1. There is a right upper lobe/right mediastinal mass measures at least 2.5 x 3 x 4 cm. This is highly suspicious for primary malignancy. Pathologic lymph nodes are noted precarinal region and right hilum. Nodular suprahilar lesion right upper lobe suspicious for satellite lesion. A nodule in right middle lobe measures 1 cm suspicious for metastatic disease. No left hilar adenopathy. No left lung lesions. 2. No acute infiltrate or pulmonary edema.  Bilateral mild emphysematous changes. 3. Extensive atherosclerotic calcifications of abdominal aorta. 4. No evidence of metastatic disease within abdomen. 5. Abundant stool noted within cecum.  No pericecal inflammation. 6. Multiple colonic diverticula descending colon and sigmoid colon. No evidence of acute diverticulitis. 7. Unremarkable prostate gland and urinary bladder. 8. Small nonspecific bilateral inguinal lymph nodes.  These results were  called by telephone at the time of interpretation on 10/10/2014 at 4:26 pm to Dr. Asencion Noble , who verbally acknowledged these results.   Electronically Signed   By: Lahoma Crocker M.D.   On: 10/10/2014 16:27      IMPRESSION/PLAN: Suspect SCLC vs NSCLC and single brain metastasis.  The pt has a PET scan scheduled on 10/21/14.  We will wait for the PET and lung biopsy results before we proceed with radiation planning. SRS may be considered if he has non-small cell lung cancer; whole brain radiotherapy over 2-3 weeks would be more standard for small cell lung cancer. If he has oligometastatic disease according to PET, then he could be considered for chemotherapy vs. chemoradiotherapy for his lung disease. The pt still has not been scheduled for cardiothoracic surgery nor lung biopsy. I will reach out to Dr. Whitney Muse who I believe made that referral. Claustrophobia: I advised the pt that he could take 1-2 tables of Ativan PRN a half hour before his treatments and future scans for anxiety. He already has some tolerance to this med.  I have ordered more Ativan for the pt. I gave the pt the contact information of Mont Dutton, RN our brain cancer nurse navigator. Will add to tumor board (CNS).  He is no longer driving, of note.     This document serves as a record of services personally performed by Eppie Gibson, MD. It was created on her behalf by Darcus Austin, a trained medical scribe. The creation of this record is based on the scribe's personal observations and the provider's  statements to them. This document has been checked and approved by the attending provider.     __________________________________________   Eppie Gibson, MD

## 2014-10-18 NOTE — Progress Notes (Signed)
Please see the Nurse Progress Note in the MD Initial Consult Encounter for this patient. 

## 2014-10-21 ENCOUNTER — Encounter (HOSPITAL_COMMUNITY)
Admission: RE | Admit: 2014-10-21 | Discharge: 2014-10-21 | Disposition: A | Discharge status: Home / Self Care | Payer: Commercial Managed Care - HMO | Source: Ambulatory Visit | Attending: Hematology & Oncology | Admitting: Hematology & Oncology

## 2014-10-21 DIAGNOSIS — R634 Abnormal weight loss: Secondary | ICD-10-CM | POA: Insufficient documentation

## 2014-10-21 DIAGNOSIS — R9389 Abnormal findings on diagnostic imaging of other specified body structures: Secondary | ICD-10-CM

## 2014-10-21 DIAGNOSIS — R938 Abnormal findings on diagnostic imaging of other specified body structures: Secondary | ICD-10-CM | POA: Diagnosis present

## 2014-10-21 LAB — GLUCOSE, CAPILLARY: Glucose-Capillary: 108 mg/dL — ABNORMAL HIGH (ref 65–99)

## 2014-10-21 MED ORDER — FLUDEOXYGLUCOSE F - 18 (FDG) INJECTION
10.3300 | Freq: Once | INTRAVENOUS | Status: DC | PRN
  Administered 2014-10-21: 10.33 via INTRAVENOUS
  Filled 2014-10-21: qty 10.33

## 2014-10-22 ENCOUNTER — Encounter: Payer: Self-pay | Admitting: *Deleted

## 2014-10-22 ENCOUNTER — Encounter: Payer: Self-pay | Admitting: Radiation Therapy

## 2014-10-22 ENCOUNTER — Encounter (HOSPITAL_BASED_OUTPATIENT_CLINIC_OR_DEPARTMENT_OTHER): Payer: Commercial Managed Care - HMO | Admitting: Oncology

## 2014-10-22 ENCOUNTER — Other Ambulatory Visit: Payer: Self-pay | Admitting: Radiation Therapy

## 2014-10-22 ENCOUNTER — Encounter (HOSPITAL_COMMUNITY): Payer: Self-pay | Admitting: Oncology

## 2014-10-22 VITALS — BP 138/73 | HR 79 | Temp 98.2°F | Resp 16 | Wt 195.1 lb

## 2014-10-22 DIAGNOSIS — C778 Secondary and unspecified malignant neoplasm of lymph nodes of multiple regions: Secondary | ICD-10-CM

## 2014-10-22 DIAGNOSIS — C7951 Secondary malignant neoplasm of bone: Secondary | ICD-10-CM | POA: Diagnosis not present

## 2014-10-22 DIAGNOSIS — C801 Malignant (primary) neoplasm, unspecified: Secondary | ICD-10-CM

## 2014-10-22 DIAGNOSIS — C7931 Secondary malignant neoplasm of brain: Secondary | ICD-10-CM

## 2014-10-22 DIAGNOSIS — C7801 Secondary malignant neoplasm of right lung: Secondary | ICD-10-CM | POA: Diagnosis not present

## 2014-10-22 NOTE — Patient Instructions (Signed)
[place] [facility] Discharge Instructions  RECOMMENDATIONS MADE BY THE CONSULTANT AND ANY TEST RESULTS WILL BE SENT TO YOUR REFERRING PHYSICIAN.  Exam and discussion by Robynn Pane, PA- C Dr. Lanell Persons plans to do Radiation as soon as they can get you an appontment. Referrals have been made with Interventional Radiology and Pulmonology to get you in for biopsy - they will contact you Continue the dexamethasone 3 times daily Follow-up here in 1 week.  Thank you for choosing [place] at [facility] to provide your oncology and hematology care.  To afford each patient quality time with our provider, please arrive at least 15 minutes before your scheduled appointment time.    You need to re-schedule your appointment should you arrive 10 or more minutes late.  We strive to give you quality time with our providers, and arriving late affects you and other patients whose appointments are after yours.  Also, if you no show three or more times for appointments you may be dismissed from the clinic at the providers discretion.     Again, thank you for choosing [facility].  Our hope is that these requests will decrease the amount of time that you wait before being seen by our physicians.       _____________________________________________________________  Should you have questions after your visit to [facility], please contact our office at [phone] between the hours of 8:30 a.m. and 4:30 p.m.  Voicemails left after 4:30 p.m. will not be returned until the following business day.  For prescription refill requests, have your pharmacy contact our office.

## 2014-10-22 NOTE — Progress Notes (Signed)
Jared Gill, Jared Gill  Brain metastasis  CURRENT THERAPY: Work-up  INTERVAL HISTORY: Jared Gill 74 y.o. male returns after being referred back to the [facility] with newly diagnosed brain lesion.    Dr. Willey Blade called me the other day to tell me about Jared Gill.  I recommended CT CAP and we would work him in about 1 week from the phone call.    The patient was subsequently set-up for a PET scan and that was performed on 10/21/2014.    I personally reviewed and went over radiographic studies with the patient.  The results are noted within this dictation.    Nursing reports that neurologically, the patient has declined since his last visit in the clinic with Dr. Whitney Muse on  10/14/2014.  See exam for details.  The patient reports new onset of double vision.  He notes that is he closes his right eye, he can see me better.  Vision disturbance is the patient's main complaint today.  Additionally, he is witnessed having difficulty ambulating into the clinic today.  He denies any nausea or vomiting.  He is currently on 8 mg of Dexamethasone every 8 hours.  I called Dr. Isidore Moos to update her on the patient's PET scan and his neurologically decline.  Her and I agree that the patient's best next intervention is SRS to the brain lesion causing his symptomatology.  Initial goal was to ascertain a tissue diagnosis to help guide radiation treatment.  However, with his rapid decline, waiting for diagnosis is not reasonable.    Past Medical History  Diagnosis Date  . Hypertension   . Abnormal MRI, spine 05/12/2011  . Nausea 01/2014  . BPH (benign prostatic hypertrophy)   . Hx of adenomatous colonic polyps     h/o tubulovillous adenomas  . Diverticulitis   . Brain metastasis   . Metastasis to brain     has Anxiety state; HYPERTENSION; GERD; COLONIC POLYPS, HX OF; Abnormal MRI, spine; Lipoma of axilla; Unintentional weight loss of more than 10 pounds;  Gastroparesis; Microcytic anemia; Constipation; and Brain metastasis on his problem list.     is allergic to reglan.  Current Outpatient Prescriptions on File Prior to Visit  Medication Sig Dispense Refill  . amLODipine (NORVASC) 5 MG tablet Take 5 mg by mouth daily.    . calcium carbonate (TUMS - DOSED IN MG ELEMENTAL CALCIUM) 500 MG chewable tablet Chew 1 tablet by mouth daily as needed for indigestion or heartburn.    . dexamethasone (DECADRON) 4 MG tablet Take 2 tablets (8 mg total) by mouth 3 (three) times daily. 180 tablet 1  . dicyclomine (BENTYL) 10 MG capsule Take 10 mg by mouth 2 (two) times daily.    [name] Kitchen doxazosin (CARDURA) 2 MG tablet Take 2 mg by mouth daily.    [name] Kitchen LORazepam (ATIVAN) 1 MG tablet Take 1 tablet (1 mg total) by mouth 2 (two) times daily. 60 tablet 0  . Multiple Vitamin (MULTIVITAMIN) capsule Take 1 capsule by mouth daily.    [name] Kitchen omeprazole (PRILOSEC) 20 MG capsule 1 PO 30 MINS PRIOR TO BREAKFAST AND AT BEDTIME 180 capsule 3  . polyethylene glycol powder (GLYCOLAX/MIRALAX) powder Take 17 g by mouth daily as needed (for constipation).     [name] Kitchen tetrahydrozoline 0.05 % ophthalmic solution Place 1 drop into both eyes daily as needed (redness relief).    [name] Kitchen HYDROcodone-acetaminophen (NORCO/VICODIN) 5-325 MG per tablet Take 1 tablet by  mouth every 6 (six) hours as needed. (Patient not taking: Reported on 08/22/2014) 20 tablet 0  . Linaclotide (LINZESS) 145 MCG CAPS capsule 1 PO WITH BREAKFAST (Patient not taking: Reported on 10/14/2014) 30 capsule 11  . LORazepam (ATIVAN) 1 MG tablet Take 1 tablet 30 minutes prior to MRI scan or prior to radiotherapy for anxiety/claustrophobia.  If absolutely needed, may take 2 tablets. (Patient not taking: Reported on 10/22/2014) 30 tablet 0  . mirtazapine (REMERON) 15 MG tablet Take 1 tablet (15 mg total) by mouth at bedtime. (Patient not taking: Reported on 08/22/2014) 90 tablet 3  . ondansetron (ZOFRAN ODT) 4 MG disintegrating tablet Take 1 tablet (4  mg total) by mouth every 6 (six) hours as needed for nausea. (Patient not taking: Reported on 10/14/2014) 30 tablet 3  . ondansetron (ZOFRAN) 4 MG tablet 1 PO AT BEDTIME TO PREVENT NAUSEA FIRST THING IN THE MORNING (Patient not taking: Reported on 10/22/2014) 90 tablet 3   Current Facility-Administered Medications on File Prior to Visit  Medication Dose Route Frequency Provider Last Rate Last Dose  . fludeoxyglucose F - 18 (FDG) injection 10.33 milli Curie  10.33 milli Curie Intravenous Once PRN Medication Radiologist, MD   10.33 milli Curie at 10/21/14 1400    Past Surgical History  Procedure Laterality Date  . Cholecystectomy  2009  . Polyp removal via colonoscopy    . Colonoscopy with esophagogastroduodenoscopy (egd) N/A 04/06/2013    OXB:DZHGDJME size internal hemorrhoids/moderate diverticulosis/medium sized HH  . Tonsilectomy, adenoidectomy, bilateral myringotomy and tubes      Denies any fevers, chills, night sweats, nausea, vomiting, diarrhea, constipation, chest pain, heart palpitations, shortness of breath, blood in stool, black tarry stool, urinary pain, urinary burning, urinary frequency, hematuria.   PHYSICAL EXAMINATION  ECOG PERFORMANCE STATUS: 2 - Symptomatic, <50% confined to bed  Filed Vitals:   10/22/14 1100  BP: 138/73  Pulse: 79  Temp: 98.2 F (36.8 C)  Resp: 16    GENERAL:alert, no distress, anxious and accompanied by brother. SKIN: skin color, texture, turgor are normal HEAD: No masses, lesions, tenderness, facial asymmetry with left facial droop EYES: sclera clear, right exophthalmos, desire to close right eye, left eyelid weakness EARS: External ears normal OROPHARYNX:lips, buccal mucosa, and tongue normal and palatal petechiae  NECK: supple, no adenopathy, trachea midline LYMPH:  no palpable lymphadenopathy BREAST:not examined LUNGS: clear to auscultation  HEART: regular rate & rhythm ABDOMEN:abdomen soft, non-tender and normal bowel sounds BACK: Back  symmetric, no curvature., No CVA tenderness EXTREMITIES:less then 2 second capillary refill, no joint deformities, effusion, or inflammation, no skin discoloration, positive findings:  Left upper extremity strength is significantly decreased.  Lower extremity strength is 5/5 and equal bilaterally. NEURO: as dictated above. Alert.    LABORATORY DATA: CBC    Component Value Date/Time   WBC 5.2 10/07/2014 1106   RBC 5.56 10/07/2014 1106   HGB 13.0 10/07/2014 1106   HCT 40.2 10/07/2014 1106   PLT 199 10/07/2014 1106   MCV 72.3* 10/07/2014 1106   MCH 23.4* 10/07/2014 1106   MCHC 32.3 10/07/2014 1106   RDW 16.1* 10/07/2014 1106   LYMPHSABS 0.9 10/07/2014 1106   MONOABS 0.6 10/07/2014 1106   EOSABS 0.1 10/07/2014 1106   BASOSABS 0.0 10/07/2014 1106      Chemistry      Component Value Date/Time   NA 140 10/07/2014 1106   K 4.3 10/07/2014 1106   CL 106 10/07/2014 1106   CO2 26 10/07/2014 1106   BUN  15 10/07/2014 1106   CREATININE 1.00 10/10/2014 1430   CREATININE 1.00 02/21/2014 1323      Component Value Date/Time   CALCIUM 9.3 10/07/2014 1106   ALKPHOS 66 10/07/2014 1106   AST 20 10/07/2014 1106   ALT 16* 10/07/2014 1106   BILITOT 0.7 10/07/2014 1106        PENDING LABS:   RADIOGRAPHIC STUDIES:  Ct Chest W Contrast  10/10/2014   CLINICAL DATA:  Brain lesion on recent MRI  EXAM: CT CHEST, ABDOMEN, AND PELVIS WITH CONTRAST  TECHNIQUE: Multidetector CT imaging of the chest, abdomen and pelvis was performed following the standard protocol during bolus administration of intravenous contrast.  CONTRAST:  180m OMNIPAQUE IOHEXOL 300 MG/ML  SOLN  COMPARISON:  CT scan abdomen and pelvis 04/29/2014 and CT scan of the chest 09/20/2012  FINDINGS: CT CHEST FINDINGS  Sagittal images of the spine shows mild degenerative changes thoracic spine. Sagittal images of the sternum are unremarkable.  Images of the thoracic inlet are unremarkable.  Central airways are patent. Mild thickening of  mid esophageal wall. A precarinal lymph node measures 1.7 by 1.3 cm in diameter. There is a right hilar lymph node measures 1.4 by 1.7 cm.  There is a right upper lobe posterior medial lesion abutting/ close proximity with right paratracheal region right upper mediastinum. The lesion measures at least 2.5 x 3 cm. This is best seen in coronal image 65 measures at least 3.9 cm cranial caudally.  This is highly suspicious for primary malignancy. There is a second nodular density in right upper lobe sent is a centrally suprahilar region measures 1.4 cm suspicious for satellite lesion.  A nodule in right middle lobe measures 1 cm suspicious for metastatic disease. No left lung nodules are noted. Mild emphysematous changes are noted bilateral upper lobe. No acute infiltrate or pulmonary edema.  No destructive rib lesions are noted.  CT ABDOMEN AND PELVIS FINDINGS  Degenerative changes are noted lumbar spine. Atherosclerotic calcifications of abdominal aorta. No aortic aneurysm.  Enhanced liver is unremarkable. Status postcholecystectomy. The pancreas, spleen and adrenal glands are unremarkable.  Kidneys are symmetrical in size and enhancement. No hydronephrosis or hydroureter.  Abundant stool noted within cecum. No pericecal inflammation. Normal appendix is noted in axial image 85. Multiple descending colon diverticula. Multiple sigmoid colon diverticula. No evidence of acute diverticulitis.  There is no small bowel obstruction. No ascites or free air. No adenopathy. Prostate gland and seminal vesicles are unremarkable. Small nonspecific inguinal lymph nodes are noted. Degenerative changes pubic symphysis.  IMPRESSION: 1. There is a right upper lobe/right mediastinal mass measures at least 2.5 x 3 x 4 cm. This is highly suspicious for primary malignancy. Pathologic lymph nodes are noted precarinal region and right hilum. Nodular suprahilar lesion right upper lobe suspicious for satellite lesion. A nodule in right middle  lobe measures 1 cm suspicious for metastatic disease. No left hilar adenopathy. No left lung lesions. 2. No acute infiltrate or pulmonary edema. Bilateral mild emphysematous changes. 3. Extensive atherosclerotic calcifications of abdominal aorta. 4. No evidence of metastatic disease within abdomen. 5. Abundant stool noted within cecum.  No pericecal inflammation. 6. Multiple colonic diverticula descending colon and sigmoid colon. No evidence of acute diverticulitis. 7. Unremarkable prostate gland and urinary bladder. 8. Small nonspecific bilateral inguinal lymph nodes.  These results were called by telephone at the time of interpretation on 10/10/2014 at 4:26 pm to Dr. RAsencion Gill, who verbally acknowledged these results.   Electronically Signed  By: Lahoma Crocker M.D.   On: 10/10/2014 16:27   Jared Gill Contrast  10/07/2014   CLINICAL DATA:  Weakness and double vision, 2 days duration.  EXAM: MRI HEAD WITHOUT AND WITH CONTRAST  TECHNIQUE: Multiplanar, multiecho pulse sequences of the brain and surrounding structures were obtained without and with intravenous contrast.  CONTRAST:  70m MULTIHANCE GADOBENATE DIMEGLUMINE 529 MG/ML IV SOLN  COMPARISON:  None.  FINDINGS: There is a 12 mm ring-enhancing lesion within the midbrain/cerebral peduncle on the right with surrounding edema. No restricted diffusion. No second lesion seen elsewhere.  Diffusion imaging does not show acute or subacute infarction. No cerebellar abnormality. The cerebral hemispheres show mild age related volume loss without evidence of old small or large vessel infarction. No hydrocephalus or extra-axial collection. No pituitary mass. No skull or skullbase lesion. There are some inflammatory changes affecting the maxillary sinuses.  IMPRESSION: 12 mm ring-enhancing lesion in the right midbrain/cerebral peduncle with surrounding edema. 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 The differential diagnosis does include primary brain tumor and abscess,  but those are less likely. Solitary demyelinating lesion is least likely   Electronically Signed   By: MNelson ChimesM.D.   On: 10/07/2014 12:23   Ct Abdomen Pelvis W Contrast  10/10/2014   CLINICAL DATA:  Brain lesion on recent MRI  EXAM: CT CHEST, ABDOMEN, AND PELVIS WITH CONTRAST  TECHNIQUE: Multidetector CT imaging of the chest, abdomen and pelvis was performed following the standard protocol during bolus administration of intravenous contrast.  CONTRAST:  1028mOMNIPAQUE IOHEXOL 300 MG/ML  SOLN  COMPARISON:  CT scan abdomen and pelvis 04/29/2014 and CT scan of the chest 09/20/2012  FINDINGS: CT CHEST FINDINGS  Sagittal images of the spine shows mild degenerative changes thoracic spine. Sagittal images of the sternum are unremarkable.  Images of the thoracic inlet are unremarkable.  Central airways are patent. Mild thickening of mid esophageal wall. A precarinal lymph node measures 1.7 by 1.3 cm in diameter. There is a right hilar lymph node measures 1.4 by 1.7 cm.  There is a right upper lobe posterior medial lesion abutting/ close proximity with right paratracheal region right upper mediastinum. The lesion measures at least 2.5 x 3 cm. This is best seen in coronal image 65 measures at least 3.9 cm cranial caudally.  This is highly suspicious for primary malignancy. There is a second nodular density in right upper lobe sent is a centrally suprahilar region measures 1.4 cm suspicious for satellite lesion.  A nodule in right middle lobe measures 1 cm suspicious for metastatic disease. No left lung nodules are noted. Mild emphysematous changes are noted bilateral upper lobe. No acute infiltrate or pulmonary edema.  No destructive rib lesions are noted.  CT ABDOMEN AND PELVIS FINDINGS  Degenerative changes are noted lumbar spine. Atherosclerotic calcifications of abdominal aorta. No aortic aneurysm.  Enhanced liver is unremarkable. Status postcholecystectomy. The pancreas, spleen and adrenal glands are  unremarkable.  Kidneys are symmetrical in size and enhancement. No hydronephrosis or hydroureter.  Abundant stool noted within cecum. No pericecal inflammation. Normal appendix is noted in axial image 85. Multiple descending colon diverticula. Multiple sigmoid colon diverticula. No evidence of acute diverticulitis.  There is no small bowel obstruction. No ascites or free air. No adenopathy. Prostate gland and seminal vesicles are unremarkable. Small nonspecific inguinal lymph nodes are noted. Degenerative changes pubic symphysis.  IMPRESSION: 1. There is a right upper lobe/right mediastinal mass measures at least 2.5 x 3 x  4 cm. This is highly suspicious for primary malignancy. Pathologic lymph nodes are noted precarinal region and right hilum. Nodular suprahilar lesion right upper lobe suspicious for satellite lesion. A nodule in right middle lobe measures 1 cm suspicious for metastatic disease. No left hilar adenopathy. No left lung lesions. 2. No acute infiltrate or pulmonary edema. Bilateral mild emphysematous changes. 3. Extensive atherosclerotic calcifications of abdominal aorta. 4. No evidence of metastatic disease within abdomen. 5. Abundant stool noted within cecum.  No pericecal inflammation. 6. Multiple colonic diverticula descending colon and sigmoid colon. No evidence of acute diverticulitis. 7. Unremarkable prostate gland and urinary bladder. 8. Small nonspecific bilateral inguinal lymph nodes.  These results were called by telephone at the time of interpretation on 10/10/2014 at 4:26 pm to Dr. Asencion Gill , who verbally acknowledged these results.   Electronically Signed   By: Lahoma Crocker M.D.   On: 10/10/2014 16:27   Nm Pet Image Initial (pi) Skull Base To Thigh  10/21/2014   CLINICAL DATA:  Initial treatment strategy for right lung mass with brain metastases.  EXAM: NUCLEAR MEDICINE PET SKULL BASE TO THIGH  TECHNIQUE: 10.3 mCi F-18 FDG was injected intravenously. Full-ring PET imaging was performed  from the skull base to thigh after the radiotracer. CT data was obtained and used for attenuation correction and anatomic localization.  FASTING BLOOD GLUCOSE:  Value: 108 mg/dl  COMPARISON:  CT chest abdomen pelvis dated 10/10/2014.  FINDINGS: NECK  No hypermetabolic lymph nodes in the neck.  CHEST  3.3 x 2.8 cm medial right upper lobe mass (series 4/ image 53), max SUV 11.9.  Two central right upper lobe nodules measuring 1.3 and 2.0 cm (series 6/ image 23), max SUV 9.9.  Nodularity along the pleural fat with focal hypermetabolism overlying the right 10th-11th rib interspace, max SUV 6.5.  Associated thoracic lymphadenopathy, including:  --6 mm short axis right supraclavicular node (series 4/ image 47), max SUV 5.3  --13 mm short axis right paratracheal node (series 4/ image 66), max SUV 19.1  --12 mm short axis right hilar node (series 4/ image 36), max SUV 13.0  ABDOMEN/PELVIS  No abnormal hypermetabolic activity within the liver, pancreas, adrenal glands, or spleen.  Status post cholecystectomy. Atherosclerotic calcifications of the abdominal aorta and branch vessels.  No hypermetabolic lymph nodes in the abdomen or pelvis.  SKELETON  Focal hypermetabolism involving the anterior column of the right acetabulum, max SUV 5.7, although without CT correlate.  Focal hypermetabolism involving the left anterior 4th rib, max SUV 5.8.  IMPRESSION: 3.3 cm medial right upper lobe mass with two central right upper lobe nodules measuring up to 2.0 cm, suspicious for primary bronchogenic neoplasm with a pancoast tumor.  Associated right supraclavicular, mediastinal, and right hilar nodal metastases.  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   Electronically Signed   By: Julian Hy M.D.   On: 10/21/2014 16:20     PATHOLOGY:    ASSESSMENT AND PLAN:  Brain metastasis Newly discovered brain metastasis with CT CAP confirming metastatic disease (excluding  primary brain tumor) with significant intrathoracic disease.  PET scan confirms hypermetabolic activity.    Now with significant neurological decline over the past week as dictated above.    Case discussed with Dr. Isidore Moos ([place]) as plan was to ascertain tissue diagnosis to guide both systemic and focal treatment, HOWEVER, with neurological decline, Dr. Isidore Moos has agreed to treat brain lesion with [facility] later this  week.    He is currently on  Dexamethasone 8 mg every 8 hours.  He is to continue this regimen.  Message sent to Pulmonology and they will see the patient on Thursday, Sept 1 at 2:45 PM followed by bronch/EBUS ASAP.  I would recommend ascertaining plenty of tissue for additional testing (ie ALK, EGFR) based upon results.    He will return next week for follow-up.  He will reschedule his appointment with [place] if it interferes with Rad Onc or pulm appointments.    THERAPY PLAN:  SRS as planned by Dr. Isidore Moos.  Pulmonology consult on Sept 1 at 2:45 PM for future bronch/EBUS.  All questions were answered. The patient knows to call the clinic with any problems, questions or concerns. We can certainly see the patient much sooner if necessary.  Patient and plan discussed with Dr. Ancil Linsey and she is in agreement with the aforementioned.   This note is electronically signed by: Doy Mince 10/22/2014 3:49 PM

## 2014-10-22 NOTE — Assessment & Plan Note (Signed)
Newly discovered brain metastasis with CT CAP confirming metastatic disease (excluding primary brain tumor) with significant intrathoracic disease.  PET scan confirms hypermetabolic activity.    Now with significant neurological decline over the past week as dictated above.    Case discussed with Dr. Isidore Moos ([place]) as plan was to ascertain tissue diagnosis to guide both systemic and focal treatment, HOWEVER, with neurological decline, Dr. Isidore Moos has agreed to treat brain lesion with [facility] later this week.    He is currently on  Dexamethasone 8 mg every 8 hours.  He is to continue this regimen.  Message sent to Pulmonology and they will see the patient on Thursday, Sept 1 at 2:45 PM followed by bronch/EBUS ASAP.  I would recommend ascertaining plenty of tissue for additional testing (ie ALK, EGFR) based upon results.    He will return next week for follow-up.  He will reschedule his appointment with [place] if it interferes with Rad Onc or pulm appointments.

## 2014-10-22 NOTE — Progress Notes (Signed)
[place] Psychosocial Distress Screening Clinical Social Work  Clinical Social Work was referred by distress screening protocol.  The patient scored a 7 on the Psychosocial Distress Thermometer which indicates severe distress. Clinical Social Worker met with Jared Gill and his brother at [facility] to assess for distress and other psychosocial needs. Jared Gill reports to live alone in his own home. He was very quite today. CSW introduced self and explained role of CSW for emotional support and assistance with resources. CSW also discussed Brain Tumor Support group as an option for additional support. CSW mentioned to brother that Jared Gill may need more assistance at home and CSW is available to problem solve and assist as needed. CSW provided contact information and explained CSW is also available at [facility] and they are both open to CSW checking in at future appointments. They agree to reach out as needed.   ONCBCN DISTRESS SCREENING 10/18/2014  Screening Type Initial Screening  Distress experienced in past week (1-10) 7  Emotional problem type Nervousness/Anxiety;Adjusting to illness  Physical Problem type Getting around;Loss of appetitie;Changes in urination    Clinical Social Worker follow up needed: Yes.    If yes, follow up plan: CSW will attempt to see at upcoming appts. Loren Racer, [place] Tuesdays   [phone]

## 2014-10-23 ENCOUNTER — Ambulatory Visit
Admission: RE | Admit: 2014-10-23 | Discharge: 2014-10-23 | Disposition: A | Discharge status: Home / Self Care | Payer: Commercial Managed Care - HMO | Source: Ambulatory Visit | Attending: Radiation Oncology | Admitting: Radiation Oncology

## 2014-10-23 VITALS — BP 131/77 | HR 80 | Temp 98.3°F | Resp 12 | Wt 195.8 lb

## 2014-10-23 DIAGNOSIS — C7931 Secondary malignant neoplasm of brain: Secondary | ICD-10-CM

## 2014-10-23 DIAGNOSIS — Z510 Encounter for antineoplastic radiation therapy: Secondary | ICD-10-CM | POA: Diagnosis not present

## 2014-10-23 MED ORDER — LORAZEPAM 1 MG PO TABS: 1.0000 mg | ORAL_TABLET | Freq: Three times a day (TID) | ORAL | Status: DC

## 2014-10-23 MED ORDER — LORAZEPAM 1 MG PO TABS
1.0000 mg | ORAL_TABLET | Freq: Once | ORAL | Status: AC
  Administered 2014-10-23: 1 mg via SUBLINGUAL
  Filled 2014-10-23: qty 1

## 2014-10-23 MED ORDER — GADOBENATE DIMEGLUMINE 529 MG/ML IV SOLN
18.0000 mL | Freq: Once | INTRAVENOUS | Status: AC | PRN
  Administered 2014-10-23: 18 mL via INTRAVENOUS

## 2014-10-23 MED ORDER — SODIUM CHLORIDE 0.9 % IJ SOLN
10.0000 mL | INTRAMUSCULAR | Status: DC | PRN
  Administered 2014-10-23: 10 mL via INTRAVENOUS

## 2014-10-23 NOTE — Progress Notes (Addendum)
  Name: Jared Gill MRN: [id number]  Date: 10/23/2014  DOB: [birth year]  SIMULATION AND TREATMENT PLANNING NOTE    ICD-9-CM ICD-10-CM   1. Brain metastasis 198.3 C79.31      NARRATIVE:  The patient was brought to the [place].  Identity was confirmed.  All relevant records and images related to the planned course of therapy were reviewed.  The patient freely provided informed written consent to proceed with treatment after reviewing the details related to the planned course of therapy. The consent form was witnessed and verified by the simulation staff. Intravenous access was established for contrast administration. Then, the patient was set-up in a stable reproducible supine position for radiation therapy.  A relocatable thermoplastic stereotactic head frame was fabricated for precise immobilization.  CT images were obtained.  Surface markings were placed.  The CT images were loaded into the planning software and fused with the patient's targeting MRI scan.  Then the target and avoidance structures were contoured.  Treatment planning then occurred.  The radiation prescription was entered and confirmed.  I have requested 3D planning  I have requested a DVH of the following structures: Brain stem, brain, left eye, right eye, lenses, optic chiasm, target volumes, uninvolved brain, and normal tissue.    PLAN:  The patient will receive 21 Gy in 3 fractions to his metastasis with stereotactic radiosurgery.  ** due to decline in patient's neurologic status, including complaints today of worsening vision and concerns relayed to me yesterday about his declining status from [facility], we are planning to treat him urgently on Friday 10-25-14.  Pathology may or may not be available by then, and patient understands and agrees with plan to treat even if pathology is not available.  MRI shows that this solitary lesion has progressed.  -----------------------------------  Eppie Gibson, MD

## 2014-10-24 ENCOUNTER — Encounter: Payer: Self-pay | Admitting: Pulmonary Disease

## 2014-10-24 ENCOUNTER — Ambulatory Visit (INDEPENDENT_AMBULATORY_CARE_PROVIDER_SITE_OTHER): Payer: Commercial Managed Care - HMO | Admitting: Pulmonary Disease

## 2014-10-24 VITALS — BP 132/70 | HR 67 | Wt 195.0 lb

## 2014-10-24 DIAGNOSIS — R918 Other nonspecific abnormal finding of lung field: Secondary | ICD-10-CM | POA: Diagnosis not present

## 2014-10-24 DIAGNOSIS — G939 Disorder of brain, unspecified: Secondary | ICD-10-CM

## 2014-10-24 DIAGNOSIS — B370 Candidal stomatitis: Secondary | ICD-10-CM

## 2014-10-24 DIAGNOSIS — G9389 Other specified disorders of brain: Secondary | ICD-10-CM

## 2014-10-24 MED ORDER — NYSTATIN 100000 UNIT/ML MT SUSP
5.0000 mL | Freq: Three times a day (TID) | OROMUCOSAL | Status: DC
Start: 2014-10-24 — End: 2014-11-25

## 2014-10-24 NOTE — Patient Instructions (Addendum)
1.  We are going to schedule you for a bronchoscopy & biopsy at [facility] as soon as possible. 2.  You can take your medications as you normally would with a sip of water. Otherwise you should not eat or drink anything for 6 hours prior to your procedure. Please do not take any blood thinners prior to your procedure. 3.  I will see you back in clinic in 2 weeks. Call me if you have any questions.

## 2014-10-24 NOTE — Progress Notes (Signed)
Subjective:    Patient ID: Jared Gill, male    DOB: 05-05-1940, 74 y.o.   MRN: [id number]  HPI He reports he went to be evaluated by his opthalmologist/optometrist about 3 weeks ago and had difficulty seeing out of his left eye. He went to see his PCP who found a brain lesion. He hadn't previously noticed any difficulty with his vision. He denies any headaches. Hadn't noticed any facial asymmetry. He reports he has had difficulty walking due to his vision but denies any focal weakness, numbness, or tingling. He denies any recurrent cough. Denies any hemoptysis. Denies any dyspnea. No chest pain or pressure. No fever, chills, or sweats. He has been sleeping more lately. He has lost about 4 pounds. He reports his appetite "comes and goes". Denies any dysphagia or odynophagia. Denies any bone pain. Denies any adenopathy in his neck, groin, or axilla.  Review of Systems No rashes or bruising. No dysuria or hematuria. No melena or hematochezia. A pertinent 14 point review of systems is negative except as per the history of presenting illness.  Allergies  Allergen Reactions  . Reglan [Metoclopramide]     ANXIETY, AGITATION, JITTERY   Current Outpatient Prescriptions on File Prior to Visit  Medication Sig Dispense Refill  . amLODipine (NORVASC) 5 MG tablet Take 5 mg by mouth daily.    . calcium carbonate (TUMS - DOSED IN MG ELEMENTAL CALCIUM) 500 MG chewable tablet Chew 1 tablet by mouth daily as needed for indigestion or heartburn.    . dexamethasone (DECADRON) 4 MG tablet Take 2 tablets (8 mg total) by mouth 3 (three) times daily. 180 tablet 1  . dicyclomine (BENTYL) 10 MG capsule Take 10 mg by mouth 2 (two) times daily.    [name] Kitchen doxazosin (CARDURA) 2 MG tablet Take 2 mg by mouth daily.    [name] Kitchen HYDROcodone-acetaminophen (NORCO/VICODIN) 5-325 MG per tablet Take 1 tablet by mouth every 6 (six) hours as needed. 20 tablet 0  . Linaclotide (LINZESS) 145 MCG CAPS capsule 1 PO WITH BREAKFAST 30 capsule 11    . LORazepam (ATIVAN) 1 MG tablet Take 1 tablet (1 mg total) by mouth every 8 (eight) hours. 30 tablet 0  . mirtazapine (REMERON) 15 MG tablet Take 1 tablet (15 mg total) by mouth at bedtime. 90 tablet 3  . Multiple Vitamin (MULTIVITAMIN) capsule Take 1 capsule by mouth daily.    [name] Kitchen omeprazole (PRILOSEC) 20 MG capsule 1 PO 30 MINS PRIOR TO BREAKFAST AND AT BEDTIME 180 capsule 3  . ondansetron (ZOFRAN ODT) 4 MG disintegrating tablet Take 1 tablet (4 mg total) by mouth every 6 (six) hours as needed for nausea. 30 tablet 3  . ondansetron (ZOFRAN) 4 MG tablet 1 PO AT BEDTIME TO PREVENT NAUSEA FIRST THING IN THE MORNING 90 tablet 3  . polyethylene glycol powder (GLYCOLAX/MIRALAX) powder Take 17 g by mouth daily as needed (for constipation).     [name] Kitchen tetrahydrozoline 0.05 % ophthalmic solution Place 1 drop into both eyes daily as needed (redness relief).     Current Facility-Administered Medications on File Prior to Visit  Medication Dose Route Frequency Provider Last Rate Last Dose  . fludeoxyglucose F - 18 (FDG) injection 10.33 milli Curie  10.33 milli Curie Intravenous Once PRN Medication Radiologist, MD   10.33 milli Curie at 10/21/14 1400   Past Medical History  Diagnosis Date  . Hypertension   . Abnormal MRI, spine 05/12/2011  . Nausea 01/2014  . BPH (benign prostatic hypertrophy)   .  Hx of adenomatous colonic polyps     h/o tubulovillous adenomas  . Diverticulitis   . Brain metastasis   . Metastasis to brain   . Lung mass     Right Upper Lobe  . GERD (gastroesophageal reflux disease)    Past Surgical History  Procedure Laterality Date  . Cholecystectomy  2009  . Polyp removal via colonoscopy    . Colonoscopy with esophagogastroduodenoscopy (egd) N/A 04/06/2013    QQV:ZDGLOVFI size internal hemorrhoids/moderate diverticulosis/medium sized HH  . Tonsilectomy, adenoidectomy, bilateral myringotomy and tubes     Family History  Problem Relation Age of Onset  . Diabetes Father   .  Diabetes Sister   . Lung cancer Brother   . Colon cancer Neg Hx    Social History   Social History  . Marital Status: Single    Spouse Name: N/A  . Number of Children: 0  . Years of Education: N/A   Occupational History  . retired    Social History Main Topics  . Smoking status: Former Smoker -- 0.50 packs/day for 40 years    Types: Cigarettes    Quit date: 10/17/2009  . Smokeless tobacco: Never Used  . Alcohol Use: No     Comment: Remote EtOH  . Drug Use: No  . Sexual Activity: Not Asked   Other Topics Concern  . None   Social History Narrative   Originally from [place]. Previously worked as a Training and development officer at [employer]. He worked in the [employer]. He previously worked Audiological scientist for [employer] Has no pets currently. No mold exposure. No asbestos exposure.       Objective:   Physical Exam Blood pressure 132/70, pulse 67, weight 195 lb (88.451 kg), SpO2 98 %. General:  Awake. Alert. No acute distress. Accompanied by brother today. ASA class II. Integument:  Warm & dry. No rash on exposed skin. No bruising. Lymphatics:  No appreciated cervical or supraclavicular lymphadenoapthy. HEENT:  Moist mucus membranes. No oral ulcers. No scleral injection or icterus. Thrush present on soft palate. PERRL. Mallampati class III. Cardiovascular:  Regular rate. No edema. No appreciable JVD.  Pulmonary:  Good aeration & clear to auscultation bilaterally. Symmetric chest wall expansion. No accessory muscle use on room air. Abdomen: Soft. Normal bowel sounds. Nondistended. Grossly nontender. Musculoskeletal:  Normal bulk and tone. Hand grip, biceps, triceps, quadriceps, & hamstring strength 5/5 bilaterally. No joint deformity or effusion appreciated. Neurological:  Preserved peripheral visual fields on the left with decreased central vision. Normal right visual fields. Mild flattening of the forehead on the left with symmetric eyebrow rise. Mild flattening of the left  nasolabial fold. No meningismus. Moving all 4 extremities equally. Symmetric patellar & brachioradialis deep tendon reflexes. Patient does have some difficulty ambulating due to decreased visual fields. Psychiatric:  Mood and affect congruent. Speech normal rhythm, rate & tone.   IMAGING MRI BRAIN W/ & W/O 10/23/14 (per radiologist): 16 mm solid metastasis right midbrain enlarged by 4 mm since prior imaging 2 weeks ago. Increased mass effect on cerebral aqueduct but stable ventricular volume.  PET CT (personally reviewed by me with the patient today): Right upper lobe paratracheal mass at level 2R with hypermetabolic activity. Additional centrally located right upper lobe nodules with hypermetabolic activity. Hypermetabolic lymph nodes seen at level 4R. hypermetabolic lymph nodes in the right hilum and right supraclavicular areas as well. No pleural effusion or thickening. Hypermetabolic activity suggesting metastasis to the left fourth rib and right acetabulum.  LABS 10/07/14  CBC: 5.2/13.0/40.2/199 BMP: 140/4.3/106/26/15/1.13/98/9.3 LFT: 4.0/7.1/0.7/66/20/16    Assessment & Plan:  74 year old male with brain mass and right upper lobe mass that is hypermetabolic and highly suggestive of a primary lung cancer. I spent a significant amount of time today discussing the advanced stage of his probable lung cancer. We discussed the need for biopsy to determine the exact etiology of his malignancy which will allow [place] to better advise him regarding treatment options. To that and we discussed bronchoscopy with endobronchial ultrasound-guided fine-needle aspiration of either his right upper lobe mass or mediastinal lymph nodes. I discussed the risks of the procedure including bleeding, infection, pneumothorax, vocal cord injury, medication allergy, and potentially death. The patient's brother was present for this discussion as well. The patient is willing to undergo the procedure. Also note he has oral thrush on  exam today which is likely secondary to the steroids for his intracerebral mass. I instructed the patient contact my office if he had any further questions or concerns before his next appointment.  1. Right upper lobe lung mass: Plan for bronchoscopy with airway exam and endobronchial ultrasound exam with probable biopsy on 9/7 or as soon as possible. 2. Brain mass: Patient follows with Dr. Camelia Eng. Planned to begin XRT. Currently on Decadron. 3. Oral thrush: Prescribing nystatin swish and swallow. 4. Follow-up: Patient will return to clinic in 2 weeks for follow-up after his procedure.

## 2014-10-25 ENCOUNTER — Ambulatory Visit
Admission: RE | Admit: 2014-10-25 | Discharge: 2014-10-25 | Disposition: A | Discharge status: Home / Self Care | Payer: Commercial Managed Care - HMO | Source: Ambulatory Visit | Attending: Radiation Oncology | Admitting: Radiation Oncology

## 2014-10-25 ENCOUNTER — Encounter (HOSPITAL_COMMUNITY): Payer: Self-pay | Admitting: *Deleted

## 2014-10-25 ENCOUNTER — Telehealth: Payer: Self-pay | Admitting: Pulmonary Disease

## 2014-10-25 ENCOUNTER — Encounter: Payer: Self-pay | Admitting: Radiation Oncology

## 2014-10-25 VITALS — BP 143/74 | HR 89 | Temp 97.8°F

## 2014-10-25 DIAGNOSIS — C7931 Secondary malignant neoplasm of brain: Secondary | ICD-10-CM

## 2014-10-25 DIAGNOSIS — Z510 Encounter for antineoplastic radiation therapy: Secondary | ICD-10-CM | POA: Diagnosis not present

## 2014-10-25 MED ORDER — FLUCONAZOLE 100 MG PO TABS: ORAL_TABLET | ORAL | Status: DC

## 2014-10-25 NOTE — Progress Notes (Signed)
   10/25/14 1955  OBSTRUCTIVE SLEEP APNEA  Have you ever been diagnosed with sleep apnea through a sleep study? No  Do you snore loudly (loud enough to be heard through closed doors)?  1  Do you often feel tired, fatigued, or sleepy during the daytime? 0  Has anyone observed you stop breathing during your sleep? 1  Do you have, or are you being treated for high blood pressure? 1  BMI more than 35 kg/m2? 0  Age over 74 years old? 1  Gender: 1

## 2014-10-25 NOTE — Progress Notes (Signed)
  Radiation Oncology         [phone] ________________________________  Stereotactic Treatment Procedure Note  Name: Jared Gill MRN: [id number]  Date: 10/25/2014  DOB: [birth year]  SPECIAL TREATMENT PROCEDURE  3D TREATMENT PLANNING AND DOSIMETRY:  The patient's radiation plan was reviewed and approved by neurosurgery and radiation oncology prior to treatment.  It showed 3-dimensional radiation distributions overlaid onto the planning CT/MRI image set.  The [facility] for the target structures as well as the organs at risk were reviewed. The documentation of the 3D plan and dosimetry are filed in the radiation oncology EMR.  NARRATIVE:  Jared Gill was brought to the TrueBeam stereotactic radiation treatment machine and placed supine on the CT couch. The head frame was applied, and the patient was set up for stereotactic radiosurgery.  Neurosurgery was present for the set-up and delivery  SIMULATION VERIFICATION:  In the couch zero-angle position, the patient underwent Exactrac imaging using the Brainlab system with orthogonal KV images.  These were carefully aligned and repeated to confirm treatment position for each of the isocenters.  The Exactrac snap film verification was repeated at each couch angle.  SPECIAL TREATMENT PROCEDURE: Jared Gill received stereotactic radiosurgery to the following targets: Right Midbrain 36m target was treated using 5 Dynamic Conformal Arcs to a prescription dose of 7 Gy.  ExacTrac Snap verification was performed for each couch angle.    This constitutes a special treatment procedure due to the ablative dose delivered and the technical nature of treatment.  This highly technical modality of treatment ensures that the ablative dose is centered on the patient's tumor while sparing normal tissues from excessive dose and risk of detrimental effects.  STEREOTACTIC TREATMENT MANAGEMENT:  Following delivery, the patient was transported to nursing in stable  condition and monitored for possible acute effects. I gave him a Rx for fluconazole for his thrush, noted on physical exam today.  He will start this in place of the nystatin. The patient tolerated treatment without significant acute effects, and was discharged to home in stable condition.    PLAN: Follow-up in 5 days for 2nd fraction. ________________________________   SEppie Gibson MD

## 2014-10-25 NOTE — Telephone Encounter (Signed)
Spoke with [place] as this call is in regards to scheduling an [place].  Forwarding to [facility] to handle message.

## 2014-10-25 NOTE — Progress Notes (Signed)
[place] treatment today..  Tolerated without any changes in status.  He has blurred vision in his right eye.  Note diffuse thrush in his oral cavity.  Given Diflucan perscription per Dr.Squire to replace the Magic mouthwash. Given instructions to rinse his mouth after eating and to decrease the amount of sweets and sodas at this time.  Family stated understanding.

## 2014-10-25 NOTE — Telephone Encounter (Signed)
Called & spoke with pt's brother, Bird[facility]).  I advised him Personnel officer) that I had called earlier to advise them that the hospital will be calling today with pre-op instructions for the pt.  Patient's brother stated that the hospital has already called and given them the instructions and arrival time.

## 2014-10-25 NOTE — Progress Notes (Signed)
I spoke to patient and his sister- in [employer].  Patient's brother prepares medications and satys with patient if needed.

## 2014-10-26 NOTE — Op Note (Signed)
  Name: Jared Gill  MRN: [id number]  Date: 10/25/2014   DOB: 1940-07-03  Stereotactic Radiosurgery Operative Note  PRE-OPERATIVE DIAGNOSIS:  Solitary Brain Metastasis  POST-OPERATIVE DIAGNOSIS:  Solitary Brain Metastasis  PROCEDURE:  Stereotactic Radiosurgery  SURGEON:  Peggyann Shoals, MD  NARRATIVE: The patient underwent a radiation treatment planning session in the radiation oncology simulation suite under the care of the radiation oncology physician and physicist.  I participated closely in the radiation treatment planning afterwards. The patient underwent planning CT which was fused to 3T high resolution MRI with 1 mm axial slices.  These images were fused on the planning system.  Radiation oncology contoured the gross target volume and subsequently expanded this to yield the Planning Target Volume. I actively participated in the planning process.  I helped to define and review the target contours and also the contours of the optic pathway, eyes, brainstem and selected nearby organs at risk.  All the dose constraints for critical structures were reviewed and compared to AAPM Task Group 101.  The prescription dose conformity was reviewed.  I approved the plan electronically.    Accordingly, [name] was brought to the TrueBeam stereotactic radiation treatment linac and placed in the custom immobilization mask.  The patient was aligned according to the IR fiducial markers with BrainLab Exactrac, then orthogonal x-rays were used in ExacTrac with the 6DOF robotic table and the shifts were made to align the patient  This lesion was complex because it was 3.5 cm or greater, adjacent (within 22m) of the optic nerve, optic chasm, optic tract, and/or within the brainstem is complex  WWandra Arthursreceived stereotactic radiosurgery uneventfully.  The detailed description of the procedure is recorded in the radiation oncology procedure note.  I was present for the duration of the  procedure.  DISPOSITION:  Following delivery, the patient was transported to nursing in stable condition and monitored for possible acute effects to be discharged to home in stable condition with follow-up in one month.  SPeggyann Shoals MD 10/26/2014 12:31 PM

## 2014-10-26 NOTE — Consult Note (Signed)
Initial Outpatient Consultation  Name: Jared Gill: [id number] Date: 8/26/2016DOB: 02-09-1941  CV:ELFYB,OFB, MD Whitney Muse, Kelby Fam, MD   REFERRING PHYSICIAN: Patrici Ranks, MD  DIAGNOSIS:    ICD-9-CM ICD-10-CM   1. Brain metastasis 198.3 C79.31 Ambulatory referral to Social Work     LORazepam (ATIVAN) 1 MG tablet    HISTORY OF PRESENT ILLNESS::Jared Gill is a 74 y.o. male who presented with a 47m ring enhancing lesion w/in midbrain/cerebral peduncle on the right with surrounding edema and abnormal CT of chest.  Patient presented with symptoms of: New onset blurred vision and double vision. Patient reports that 3 weeks ago that he felt his vision was blurry and thought it was his glasses. He went to his eye doctor and was told to see his primary care doctor. He went to the ER and a MRI was done.  Past or anticipated interventions, if any, per neurosurgery: n/a  Past or anticipated interventions, if any, per medical oncology: biopsy ordered of lung Dose of Decadron, if applicable: yes- 4 mg tablet, take 2 tables (8 mg total) by mouth 3 times a day.  Recent neurologic symptoms, if any:   Seizures: NO   Headaches:no   Nausea: No, but weight loss of approximately 36 lbs.  Dizziness/ataxia: unsteady gait   Focal numbness/weakness: no   Visual deficits/changes: yes- blurry vision and double vision. He reports his vision is better when he closes his right eye.   Confusion/Memory deficits: denied   Additional Complaints / other details: Single. No children. Ex smoker and quit 5 years ago. ETOH, previously drank "not a whole lot." Worked at [employer] years.   Thoracic surgery consultation is pending to obtain biopsy. Patient presents to the clinic with his nephew. I have reviewed all of his recent imaging personally. Notable details include MRI of the brain on 10/07/14; this shows a 12 mm brain  enhancing lesion in the right midbrain/cerebral peduncle. There is surrounding edema. No other lesions are appreciated. Radiology feels that metastasis is the most likely diagnosis, but cannot rule out glioma or abscess. I reviewed his CT imaging of chest/ab/pelvis on 10/10/14; this demonstrates disease in the central right lung and right mediastinum. Small cell lung cancer is certainly suspicious, but non-small cell lung cancer is also a possibility.  PREVIOUS RADIATION THERAPY: No  PAST MEDICAL HISTORY:  has a past medical history of Hypertension; Abnormal MRI, spine (05/12/2011); Nausea (01/2014); BPH (benign prostatic hypertrophy); adenomatous colonic polyps; Diverticulitis; and Brain metastasis.   PAST SURGICAL HISTORY: Past Surgical History  Procedure Laterality Date  . Cholecystectomy  2009  . Polyp removal via colonoscopy    . Colonoscopy with esophagogastroduodenoscopy (egd) N/A 04/06/2013    SPZW:CHENIDPOsize internal hemorrhoids/moderate diverticulosis/medium sized HH  . Tonsilectomy, adenoidectomy, bilateral myringotomy and tubes      FAMILY HISTORY: family history includes Diabetes in his father and sister; Lung cancer in his brother. There is no history of Colon cancer.  SOCIAL HISTORY:  reports that he quit smoking about 5 years ago. His smoking use included Cigarettes. He has a 20 pack-year smoking history. He has never used smokeless tobacco. He reports that he does not drink alcohol or use illicit drugs.  ALLERGIES: Reglan  MEDICATIONS:  Current Outpatient Prescriptions  Medication Sig Dispense Refill  . amLODipine (NORVASC) 5 MG tablet Take 5 mg by mouth daily.    . calcium carbonate (TUMS - DOSED IN MG ELEMENTAL CALCIUM) 500 MG chewable tablet Chew 1 tablet by mouth  daily as needed for indigestion or heartburn.    . dexamethasone (DECADRON) 4 MG tablet Take 2 tablets (8 mg total) by mouth 3 (three) times daily. 180 tablet 1  .  dicyclomine (BENTYL) 10 MG capsule Take 10 mg by mouth 2 (two) times daily.    [name] Kitchen doxazosin (CARDURA) 2 MG tablet Take 2 mg by mouth daily.    [name] Kitchen LORazepam (ATIVAN) 1 MG tablet Take 1 tablet (1 mg total) by mouth 2 (two) times daily. 60 tablet 0  . Multiple Vitamin (MULTIVITAMIN) capsule Take 1 capsule by mouth daily.    [name] Kitchen omeprazole (PRILOSEC) 20 MG capsule 1 PO 30 MINS PRIOR TO BREAKFAST AND AT BEDTIME 180 capsule 3  . ondansetron (ZOFRAN) 4 MG tablet 1 PO AT BEDTIME TO PREVENT NAUSEA FIRST THING IN THE MORNING 90 tablet 3  . polyethylene glycol powder (GLYCOLAX/MIRALAX) powder Take 17 g by mouth daily as needed (for constipation).     [name] Kitchen tetrahydrozoline 0.05 % ophthalmic solution Place 1 drop into both eyes daily as needed (redness relief).    [name] Kitchen HYDROcodone-acetaminophen (NORCO/VICODIN) 5-325 MG per tablet Take 1 tablet by mouth every 6 (six) hours as needed. (Patient not taking: Reported on 08/22/2014) 20 tablet 0  . Linaclotide (LINZESS) 145 MCG CAPS capsule 1 PO WITH BREAKFAST (Patient not taking: Reported on 10/14/2014) 30 capsule 11  . LORazepam (ATIVAN) 1 MG tablet Take 1 tablet 30 minutes prior to MRI scan or prior to radiotherapy for anxiety/claustrophobia. If absolutely needed, may take 2 tablets. 30 tablet 0  . mirtazapine (REMERON) 15 MG tablet Take 1 tablet (15 mg total) by mouth at bedtime. (Patient not taking: Reported on 08/22/2014) 90 tablet 3  . ondansetron (ZOFRAN ODT) 4 MG disintegrating tablet Take 1 tablet (4 mg total) by mouth every 6 (six) hours as needed for nausea. (Patient not taking: Reported on 10/14/2014) 30 tablet 3   No current facility-administered medications for this encounter.    REVIEW OF SYSTEMS: Notable for that above. He denies headaches, seizures, weakness on either side of the body, numbness on either side of the body, and dizziness. He reports an unsteady gait and he decided not to drive  anymore. Denies a prior diagnosis of cancer. He reports that he has blurred vision and diplopia. He states his vision has not improved since taking Decadron and that the right eye is worse than the left eye. The pt reports that he is claustrophobic and had to be sedated for his MRI. He was given an Ativan '1mg'$  injection. Denies SOB/hemoptysis. The pt's nephew states that he noticed that the pt's hands have started to tremble a few weeks ago.  PHYSICAL EXAM:  height is '6\' 2"'$  (1.88 m) and weight is 196 lb 9.6 oz (89.177 kg). His oral temperature is 98.3 F (36.8 C). His blood pressure is 135/63 and his pulse is 75. His respiration is 16 and oxygen saturation is 99%.   General: Alert and oriented, in mild acute distress HEENT: Head is normocephalic. Extraocular movements are intact. Oropharynx is clear. Wears upper dentures. No thrush. Neck: Neck is supple, no palpable cervical or supraclavicular lymphadenopathy. Heart: Regular in rate and rhythm with no murmurs, rubs, or gallops. Chest: Clear to auscultation bilaterally, with no rhonchi, wheezes, or rales. Abdomen: Soft, nontender, nondistended, with no rigidity or guarding. Extremities: No cyanosis or edema. Lymphatics: see Neck Exam Skin: No concerning lesions. Musculoskeletal: symmetric strength and muscle tone throughout. Neurologic: Cranial nerves II through XII are grossly intact with the  exception of blurred vision right eye. He has minimal right pronator drift.  He is unsteady standing.   Speech is fluent. Finger to nose testing is performed with some difficulty. He has a subtle involuntary tremor of his hands bilaterally. Pupils are equally rounded and reactive to light. Psychiatric: Judgment and insight are intact. Affect is appropriate.  KPS = 70  100 - Normal; no complaints; no evidence of disease. 90 - Able to carry on normal activity; minor signs or symptoms of disease. 80 - Normal activity with effort; some signs or symptoms of  disease. 10 - Cares for self; unable to carry on normal activity or to do active work. 60 - Requires occasional assistance, but is able to care for most of his personal needs. 50 - Requires considerable assistance and frequent medical care. 69 - Disabled; requires special care and assistance. 25 - Severely disabled; hospital admission is indicated although death not imminent. 23 - Very sick; hospital admission necessary; active supportive treatment necessary. 10 - Moribund; fatal processes progressing rapidly. 0 - Dead  Karnofsky DA, Abelmann WH, Craver LS and Burchenal [facility] [phone]) The use of the nitrogen mustards in the palliative treatment of carcinoma: with particular reference to bronchogenic carcinoma Cancer 1 634-56  ECOG = 2  0 - Asymptomatic (Fully active, able to carry on all predisease activities without restriction)  1 - Symptomatic but completely ambulatory (Restricted in physically strenuous activity but ambulatory and able to carry out work of a light or sedentary nature. For example, light housework, office work)  2 - Symptomatic, <50% in bed during the day (Ambulatory and capable of all self care but unable to carry out any work activities. Up and about more than 50% of waking hours)  3 - Symptomatic, >50% in bed, but not bedbound (Capable of only limited self-care, confined to bed or chair 50% or more of waking hours)  4 - Bedbound (Completely disabled. Cannot carry on any self-care. Totally confined to bed or chair)  5 - Death  Eustace Pen MM, Creech RH, Tormey DC, et al. [phone]). "Toxicity and response criteria of the [facility]". [place] Oncol. 5 (6): 649-55   LABORATORY DATA:   Recent Labs    Lab Results  Component Value Date   WBC 5.2 10/07/2014   HGB 13.0 10/07/2014   HCT 40.2 10/07/2014   MCV 72.3* 10/07/2014   PLT 199 10/07/2014     CMP  Labs (Brief)       Component Value  Date/Time   NA 140 10/07/2014 1106   K 4.3 10/07/2014 1106   CL 106 10/07/2014 1106   CO2 26 10/07/2014 1106   GLUCOSE 98 10/07/2014 1106   BUN 15 10/07/2014 1106   CREATININE 1.00 10/10/2014 1430   CREATININE 1.00 02/21/2014 1323   CALCIUM 9.3 10/07/2014 1106   PROT 7.1 10/07/2014 1106   ALBUMIN 4.0 10/07/2014 1106   AST 20 10/07/2014 1106   ALT 16* 10/07/2014 1106   ALKPHOS 66 10/07/2014 1106   BILITOT 0.7 10/07/2014 1106   GFRNONAA >60 10/07/2014 1106   GFRAA >60 10/07/2014 1106          Imaging Results    RADIOGRAPHY: Ct Chest W Contrast  10/10/2014 CLINICAL DATA: Brain lesion on recent MRI EXAM: CT CHEST, ABDOMEN, AND PELVIS WITH CONTRAST TECHNIQUE: Multidetector CT imaging of the chest, abdomen and pelvis was performed following the standard protocol during bolus administration of intravenous contrast. CONTRAST: 116m OMNIPAQUE IOHEXOL 300 MG/ML SOLN COMPARISON:  CT scan abdomen and pelvis 04/29/2014 and CT scan of the chest 09/20/2012 FINDINGS: CT CHEST FINDINGS Sagittal images of the spine shows mild degenerative changes thoracic spine. Sagittal images of the sternum are unremarkable. Images of the thoracic inlet are unremarkable. Central airways are patent. Mild thickening of mid esophageal wall. A precarinal lymph node measures 1.7 by 1.3 cm in diameter. There is a right hilar lymph node measures 1.4 by 1.7 cm. There is a right upper lobe posterior medial lesion abutting/ close proximity with right paratracheal region right upper mediastinum. The lesion measures at least 2.5 x 3 cm. This is best seen in coronal image 65 measures at least 3.9 cm cranial caudally. This is highly suspicious for primary malignancy. There is a second nodular density in right upper lobe sent is a centrally suprahilar region measures 1.4 cm suspicious for satellite lesion. A nodule in right middle lobe measures 1 cm  suspicious for metastatic disease. No left lung nodules are noted. Mild emphysematous changes are noted bilateral upper lobe. No acute infiltrate or pulmonary edema. No destructive rib lesions are noted. CT ABDOMEN AND PELVIS FINDINGS Degenerative changes are noted lumbar spine. Atherosclerotic calcifications of abdominal aorta. No aortic aneurysm. Enhanced liver is unremarkable. Status postcholecystectomy. The pancreas, spleen and adrenal glands are unremarkable. Kidneys are symmetrical in size and enhancement. No hydronephrosis or hydroureter. Abundant stool noted within cecum. No pericecal inflammation. Normal appendix is noted in axial image 85. Multiple descending colon diverticula. Multiple sigmoid colon diverticula. No evidence of acute diverticulitis. There is no small bowel obstruction. No ascites or free air. No adenopathy. Prostate gland and seminal vesicles are unremarkable. Small nonspecific inguinal lymph nodes are noted. Degenerative changes pubic symphysis. IMPRESSION: 1. There is a right upper lobe/right mediastinal mass measures at least 2.5 x 3 x 4 cm. This is highly suspicious for primary malignancy. Pathologic lymph nodes are noted precarinal region and right hilum. Nodular suprahilar lesion right upper lobe suspicious for satellite lesion. A nodule in right middle lobe measures 1 cm suspicious for metastatic disease. No left hilar adenopathy. No left lung lesions. 2. No acute infiltrate or pulmonary edema. Bilateral mild emphysematous changes. 3. Extensive atherosclerotic calcifications of abdominal aorta. 4. No evidence of metastatic disease within abdomen. 5. Abundant stool noted within cecum. No pericecal inflammation. 6. Multiple colonic diverticula descending colon and sigmoid colon. No evidence of acute diverticulitis. 7. Unremarkable prostate gland and urinary bladder. 8. Small nonspecific bilateral inguinal lymph nodes. These results were called by telephone at the time of  interpretation on 10/10/2014 at 4:26 pm to Dr. Asencion Noble , who verbally acknowledged these results. Electronically Signed By: Lahoma Crocker M.D. On: 10/10/2014 16:27   Mr Jeri Cos TD Contrast  10/07/2014 CLINICAL DATA: Weakness and double vision, 2 days duration. EXAM: MRI HEAD WITHOUT AND WITH CONTRAST TECHNIQUE: Multiplanar, multiecho pulse sequences of the brain and surrounding structures were obtained without and with intravenous contrast. CONTRAST: 25m MULTIHANCE GADOBENATE DIMEGLUMINE 529 MG/ML IV SOLN COMPARISON: None. FINDINGS: There is a 12 mm ring-enhancing lesion within the midbrain/cerebral peduncle on the right with surrounding edema. No restricted diffusion. No second lesion seen elsewhere. Diffusion imaging does not show acute or subacute infarction. No cerebellar abnormality. The cerebral hemispheres show mild age related volume loss without evidence of old small or large vessel infarction. No hydrocephalus or extra-axial collection. No pituitary mass. No skull or skullbase lesion. There are some inflammatory changes affecting the maxillary sinuses. IMPRESSION: 12 mm ring-enhancing lesion in the right midbrain/cerebral peduncle  with surrounding edema. 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 The differential diagnosis does include primary brain tumor and abscess, but those are less likely. Solitary demyelinating lesion is least likely Electronically Signed By: Nelson Chimes M.D. On: 10/07/2014 12:23   Ct Abdomen Pelvis W Contrast  10/10/2014 CLINICAL DATA: Brain lesion on recent MRI EXAM: CT CHEST, ABDOMEN, AND PELVIS WITH CONTRAST TECHNIQUE: Multidetector CT imaging of the chest, abdomen and pelvis was performed following the standard protocol during bolus administration of intravenous contrast. CONTRAST: 149m OMNIPAQUE IOHEXOL 300 MG/ML SOLN COMPARISON: CT scan abdomen and pelvis 04/29/2014 and CT scan of the chest 09/20/2012 FINDINGS: CT CHEST FINDINGS  Sagittal images of the spine shows mild degenerative changes thoracic spine. Sagittal images of the sternum are unremarkable. Images of the thoracic inlet are unremarkable. Central airways are patent. Mild thickening of mid esophageal wall. A precarinal lymph node measures 1.7 by 1.3 cm in diameter. There is a right hilar lymph node measures 1.4 by 1.7 cm. There is a right upper lobe posterior medial lesion abutting/ close proximity with right paratracheal region right upper mediastinum. The lesion measures at least 2.5 x 3 cm. This is best seen in coronal image 65 measures at least 3.9 cm cranial caudally. This is highly suspicious for primary malignancy. There is a second nodular density in right upper lobe sent is a centrally suprahilar region measures 1.4 cm suspicious for satellite lesion. A nodule in right middle lobe measures 1 cm suspicious for metastatic disease. No left lung nodules are noted. Mild emphysematous changes are noted bilateral upper lobe. No acute infiltrate or pulmonary edema. No destructive rib lesions are noted. CT ABDOMEN AND PELVIS FINDINGS Degenerative changes are noted lumbar spine. Atherosclerotic calcifications of abdominal aorta. No aortic aneurysm. Enhanced liver is unremarkable. Status postcholecystectomy. The pancreas, spleen and adrenal glands are unremarkable. Kidneys are symmetrical in size and enhancement. No hydronephrosis or hydroureter. Abundant stool noted within cecum. No pericecal inflammation. Normal appendix is noted in axial image 85. Multiple descending colon diverticula. Multiple sigmoid colon diverticula. No evidence of acute diverticulitis. There is no small bowel obstruction. No ascites or free air. No adenopathy. Prostate gland and seminal vesicles are unremarkable. Small nonspecific inguinal lymph nodes are noted. Degenerative changes pubic symphysis. IMPRESSION: 1. There is a right upper lobe/right mediastinal mass measures at least 2.5 x 3 x 4  cm. This is highly suspicious for primary malignancy. Pathologic lymph nodes are noted precarinal region and right hilum. Nodular suprahilar lesion right upper lobe suspicious for satellite lesion. A nodule in right middle lobe measures 1 cm suspicious for metastatic disease. No left hilar adenopathy. No left lung lesions. 2. No acute infiltrate or pulmonary edema. Bilateral mild emphysematous changes. 3. Extensive atherosclerotic calcifications of abdominal aorta. 4. No evidence of metastatic disease within abdomen. 5. Abundant stool noted within cecum. No pericecal inflammation. 6. Multiple colonic diverticula descending colon and sigmoid colon. No evidence of acute diverticulitis. 7. Unremarkable prostate gland and urinary bladder. 8. Small nonspecific bilateral inguinal lymph nodes. These results were called by telephone at the time of interpretation on 10/10/2014 at 4:26 pm to Dr. RAsencion Noble, who verbally acknowledged these results. Electronically Signed By: LLahoma CrockerM.D. On: 10/10/2014 16:27      IMPRESSION/PLAN: Suspect SCLC vs NSCLC and single brain metastasis. The pt has a PET scan scheduled on 10/21/14. We will wait for the PET and lung biopsy results before we proceed with radiation planning. SRS may be considered if he has non-small cell  lung cancer; whole brain radiotherapy over 2-3 weeks would be more standard for small cell lung cancer. If he has oligometastatic disease according to PET, then he could be considered for chemotherapy vs. chemoradiotherapy for his lung disease. The pt still has not been scheduled for cardiothoracic surgery nor lung biopsy. I will reach out to Dr. Whitney Muse who I believe made that referral. Claustrophobia: patient was advised that he could take 1-2 tables of Ativan PRN a half hour before his treatments and future scans for anxiety. He already has some tolerance to this med. Based on our review of patient's case in Tumor Board, we have recommended proceeding  with fractionated SRS to the right midbrain/thalamic lesion, rather than waiting for results of biopsy of chest lesion, given the large size, symptomatic nature, and critical location of this lesion.  Patient understands risks and benefits of proceeding with treatment and wishes to do so.  Marchia Meiers. Vertell Limber, MD

## 2014-10-29 ENCOUNTER — Ambulatory Visit (HOSPITAL_COMMUNITY)
Admission: RE | Admit: 2014-10-29 | Discharge: 2014-10-29 | Disposition: A | Discharge status: Home / Self Care | Payer: Commercial Managed Care - HMO | Source: Ambulatory Visit | Attending: Pulmonary Disease | Admitting: Pulmonary Disease

## 2014-10-29 ENCOUNTER — Ambulatory Visit (HOSPITAL_COMMUNITY): Payer: Commercial Managed Care - HMO | Admitting: Anesthesiology

## 2014-10-29 ENCOUNTER — Encounter (HOSPITAL_COMMUNITY): Payer: Self-pay | Admitting: General Practice

## 2014-10-29 ENCOUNTER — Telehealth: Payer: Self-pay | Admitting: *Deleted

## 2014-10-29 ENCOUNTER — Other Ambulatory Visit: Payer: Self-pay | Admitting: Pulmonary Disease

## 2014-10-29 ENCOUNTER — Encounter (HOSPITAL_COMMUNITY)
Admission: RE | Disposition: A | Discharge status: Home / Self Care | Payer: Self-pay | Source: Ambulatory Visit | Attending: Pulmonary Disease

## 2014-10-29 DIAGNOSIS — R918 Other nonspecific abnormal finding of lung field: Secondary | ICD-10-CM | POA: Insufficient documentation

## 2014-10-29 DIAGNOSIS — B370 Candidal stomatitis: Secondary | ICD-10-CM | POA: Diagnosis not present

## 2014-10-29 DIAGNOSIS — Z01811 Encounter for preprocedural respiratory examination: Secondary | ICD-10-CM

## 2014-10-29 DIAGNOSIS — K219 Gastro-esophageal reflux disease without esophagitis: Secondary | ICD-10-CM | POA: Diagnosis not present

## 2014-10-29 DIAGNOSIS — I10 Essential (primary) hypertension: Secondary | ICD-10-CM | POA: Insufficient documentation

## 2014-10-29 DIAGNOSIS — C7931 Secondary malignant neoplasm of brain: Secondary | ICD-10-CM | POA: Diagnosis not present

## 2014-10-29 DIAGNOSIS — Z7952 Long term (current) use of systemic steroids: Secondary | ICD-10-CM | POA: Insufficient documentation

## 2014-10-29 DIAGNOSIS — Z87891 Personal history of nicotine dependence: Secondary | ICD-10-CM | POA: Diagnosis not present

## 2014-10-29 DIAGNOSIS — Z79899 Other long term (current) drug therapy: Secondary | ICD-10-CM | POA: Insufficient documentation

## 2014-10-29 DIAGNOSIS — F419 Anxiety disorder, unspecified: Secondary | ICD-10-CM | POA: Diagnosis not present

## 2014-10-29 HISTORY — PX: VIDEO BRONCHOSCOPY WITH ENDOBRONCHIAL ULTRASOUND: SHX6177

## 2014-10-29 HISTORY — DX: Other visual disturbances: H53.8

## 2014-10-29 LAB — BASIC METABOLIC PANEL
Anion gap: 10 (ref 5–15)
BUN: 20 mg/dL (ref 6–20)
CALCIUM: 8.7 mg/dL — AB (ref 8.9–10.3)
CO2: 22 mmol/L (ref 22–32)
CREATININE: 1.08 mg/dL (ref 0.61–1.24)
Chloride: 101 mmol/L (ref 101–111)
GFR calc non Af Amer: >60 mL/min (ref >60)
Glucose, Bld: 97 mg/dL (ref 65–99)
Potassium: 4.2 mmol/L (ref 3.5–5.1)
SODIUM: 133 mmol/L — AB (ref 135–145)

## 2014-10-29 LAB — CBC
HCT: 42.9 % (ref 39.0–52.0)
Hemoglobin: 14.7 g/dL (ref 13.0–17.0)
MCH: 23.8 pg — AB (ref 26.0–34.0)
MCHC: 34.3 g/dL (ref 30.0–36.0)
MCV: 69.5 fL — ABNORMAL LOW (ref 78.0–100.0)
PLATELETS: 131 10*3/uL — AB (ref 150–400)
RBC: 6.17 MIL/uL — AB (ref 4.22–5.81)
RDW: 16.3 % — AB (ref 11.5–15.5)
WBC: 13.9 10*3/uL — ABNORMAL HIGH (ref 4.0–10.5)

## 2014-10-29 SURGERY — BRONCHOSCOPY, WITH EBUS
Anesthesia: Monitor Anesthesia Care

## 2014-10-29 MED ORDER — MIDAZOLAM HCL 2 MG/2ML IJ SOLN
INTRAMUSCULAR | Status: AC
  Filled 2014-10-29: qty 4

## 2014-10-29 MED ORDER — LACTATED RINGERS IV SOLN
INTRAVENOUS | Status: DC
  Administered 2014-10-29: 08:00:00 via INTRAVENOUS

## 2014-10-29 MED ORDER — MIDAZOLAM HCL 2 MG/2ML IJ SOLN: 0.5000 mg | Freq: Once | INTRAMUSCULAR | Status: DC | PRN

## 2014-10-29 MED ORDER — MEPERIDINE HCL 25 MG/ML IJ SOLN: 6.2500 mg | INTRAMUSCULAR | Status: DC | PRN

## 2014-10-29 MED ORDER — FENTANYL CITRATE (PF) 250 MCG/5ML IJ SOLN
INTRAMUSCULAR | Status: AC
  Filled 2014-10-29: qty 5

## 2014-10-29 MED ORDER — EPHEDRINE SULFATE 50 MG/ML IJ SOLN
INTRAMUSCULAR | Status: AC
  Filled 2014-10-29: qty 1

## 2014-10-29 MED ORDER — LIDOCAINE HCL (CARDIAC) 20 MG/ML IV SOLN
INTRAVENOUS | Status: AC
  Filled 2014-10-29: qty 5

## 2014-10-29 MED ORDER — SODIUM CHLORIDE 0.9 % IJ SOLN
INTRAMUSCULAR | Status: AC
  Filled 2014-10-29: qty 10

## 2014-10-29 MED ORDER — FENTANYL CITRATE (PF) 100 MCG/2ML IJ SOLN: 25.0000 ug | INTRAMUSCULAR | Status: DC | PRN

## 2014-10-29 MED ORDER — LIDOCAINE HCL (PF) 1 % IJ SOLN
INTRAMUSCULAR | Status: DC | PRN
  Administered 2014-10-29: 22 mL

## 2014-10-29 MED ORDER — FENTANYL CITRATE (PF) 100 MCG/2ML IJ SOLN
INTRAMUSCULAR | Status: DC | PRN
  Administered 2014-10-29: 25 ug via INTRAVENOUS
  Administered 2014-10-29 (×3): 50 ug via INTRAVENOUS

## 2014-10-29 MED ORDER — LIDOCAINE HCL (PF) 1 % IJ SOLN
INTRAMUSCULAR | Status: AC
  Filled 2014-10-29: qty 30

## 2014-10-29 MED ORDER — SODIUM CHLORIDE 0.9 % IV SOLN: Freq: Once | INTRAVENOUS | Status: DC

## 2014-10-29 MED ORDER — BUTAMBEN-TETRACAINE-BENZOCAINE 2-2-14 % EX AERO
INHALATION_SPRAY | CUTANEOUS | Status: DC | PRN
  Administered 2014-10-29: 2 via TOPICAL

## 2014-10-29 MED ORDER — MIDAZOLAM HCL 5 MG/5ML IJ SOLN
INTRAMUSCULAR | Status: DC | PRN
  Administered 2014-10-29: 1 mg via INTRAVENOUS

## 2014-10-29 MED ORDER — ROCURONIUM BROMIDE 50 MG/5ML IV SOLN
INTRAVENOUS | Status: AC
  Filled 2014-10-29: qty 1

## 2014-10-29 MED ORDER — LACTATED RINGERS IV SOLN
INTRAVENOUS | Status: DC | PRN
  Administered 2014-10-29: 09:00:00 via INTRAVENOUS

## 2014-10-29 MED ORDER — PROMETHAZINE HCL 25 MG/ML IJ SOLN: 6.2500 mg | INTRAMUSCULAR | Status: DC | PRN

## 2014-10-29 SURGICAL SUPPLY — 29 items
BRUSH CYTOL CELLEBRITY 1.5X140 (MISCELLANEOUS) IMPLANT
CANISTER SUCTION 2500CC (MISCELLANEOUS) ×3 IMPLANT
CONT SPEC 4OZ CLIKSEAL STRL BL (MISCELLANEOUS) ×3 IMPLANT
COVER TABLE BACK 60X90 (DRAPES) ×3 IMPLANT
FORCEPS BIOP RJ4 1.8 (CUTTING FORCEPS) IMPLANT
GAUZE SPONGE 4X4 12PLY STRL (GAUZE/BANDAGES/DRESSINGS) ×3 IMPLANT
GLOVE BIOGEL M STRL SZ7.5 (GLOVE) ×3 IMPLANT
GLOVE SURG SS PI 7.0 STRL IVOR (GLOVE) ×3 IMPLANT
GOWN STRL REUS W/ TWL XL LVL3 (GOWN DISPOSABLE) ×1 IMPLANT
GOWN STRL REUS W/TWL XL LVL3 (GOWN DISPOSABLE) ×2
KIT CLEAN ENDO COMPLIANCE (KITS) ×3 IMPLANT
KIT ROOM TURNOVER OR (KITS) IMPLANT
MARKER SKIN DUAL TIP RULER LAB (MISCELLANEOUS) ×3 IMPLANT
NEEDLE BIOPSY TRANSBRONCH 21G (NEEDLE) IMPLANT
NEEDLE ECHOTIP HI DEF 22GA (NEEDLE) ×3 IMPLANT
NEEDLE SONO TIP II EBUS (NEEDLE) IMPLANT
NS IRRIG 1000ML POUR BTL (IV SOLUTION) ×3 IMPLANT
OIL SILICONE PENTAX (PARTS (SERVICE/REPAIRS)) IMPLANT
PAD ARMBOARD 7.5X6 YLW CONV (MISCELLANEOUS) ×6 IMPLANT
STOPCOCK 4 WAY LG BORE MALE ST (IV SETS) ×3 IMPLANT
SYR 20CC LL (SYRINGE) ×3 IMPLANT
SYR 20ML ECCENTRIC (SYRINGE) ×6 IMPLANT
SYR 5ML LUER SLIP (SYRINGE) ×3 IMPLANT
SYRINGE 10CC LL (SYRINGE) ×9 IMPLANT
TOWEL OR 17X24 6PK STRL BLUE (TOWEL DISPOSABLE) ×3 IMPLANT
TRAP SPECIMEN MUCOUS 40CC (MISCELLANEOUS) IMPLANT
TUBE CONNECTING 20'X1/4 (TUBING) ×2
TUBE CONNECTING 20X1/4 (TUBING) ×4 IMPLANT
WATER STERILE IRR 1000ML POUR (IV SOLUTION) ×3 IMPLANT

## 2014-10-29 NOTE — Anesthesia Preprocedure Evaluation (Addendum)
Anesthesia Evaluation  Patient identified by MRN, date of birth, ID band Patient awake    Reviewed: Allergy & Precautions, NPO status , Patient's Chart, lab work & pertinent test results  History of Anesthesia Complications Negative for: history of anesthetic complications  Airway Mallampati: II  TM Distance: >3 FB Neck ROM: Full    Dental  (+) Edentulous Upper, Edentulous Lower   Pulmonary former smoker (quit 2011),  RUL Lung mass with brain mets breath sounds clear to auscultation        Cardiovascular hypertension, Pt. on medications - anginaRhythm:Regular Rate:Normal     Neuro/Psych Anxiety Brain mets    GI/Hepatic Neg liver ROS, GERD-  Medicated and Controlled,  Endo/Other  negative endocrine ROS  Renal/GU negative Renal ROS     Musculoskeletal   Abdominal   Peds  Hematology negative hematology ROS (+)   Anesthesia Other Findings   Reproductive/Obstetrics                           Anesthesia Physical Anesthesia Plan  ASA: III  Anesthesia Plan: MAC   Post-op Pain Management:    Induction: Intravenous  Airway Management Planned:   Additional Equipment:   Intra-op Plan:   Post-operative Plan:   Informed Consent: I have reviewed the patients History and Physical, chart, labs and discussed the procedure including the risks, benefits and alternatives for the proposed anesthesia with the patient or authorized representative who has indicated his/her understanding and acceptance.     Plan Discussed with: CRNA and Surgeon  Anesthesia Plan Comments: (Plan routine monitors, MAC)        Anesthesia Quick Evaluation

## 2014-10-29 NOTE — Op Note (Signed)
Video Bronchoscopy Procedure Note  Pre-Procedure Diagnoses: 1. Right Upper Lobe Mass  Consent:  Informed consent was obtained from the patient after discussing the risks and benefits of the procedure including bleeding, infection, pneumothorax, medication allergy, vocal chord injury, and potentially death.  Medications Administered:   Per anesthesia records. 1. Lidocaine 1% gargle 10cc 2. Lidocaine 1% 12cc via bronchoscope  Pre-Procedure Physical Exam: General:  No acute distress. Awake. Alert. ASA Class 3. HEENT:  Moist mucus membranes. No oral ulcers. Mallampati Class III. Cardiovascular:  Regular rate. No edema. No appreciable JVD. Pulmonary:  Clear to auscultation with good aeration bilaterally. Normal work of breathing. Abdomen:  Soft. Nontender. Nondistended. Normal bowel sounds. Musculoskeletal:  Normal bulk and tone. Normal neck flexion & extension. Neurological:  Oriented to person, place, and time. Moving all 4 extremities equally. Still with left central vision deficit.  Description of Procedure: Patient was brought back to OR 10.  A time out was performed to identify the correct patient and procedure.  Lidocaine gargle was performed.  Patient was laid recumbent and conscious sedation was administered by Anesthesia.  Bite block was inserted and towel was placed over the patient's eyes.  Flexible bronchoscope was then inserted into the posterior pharynx until vocal chords were in full view. There was no abnormality of the vocal chords or arytenoids.  A total of 6cc of Lidocaine was used to anesthetize the vocal chords.  The bronchoscope was then inserted between the vocal chords with ease into the proximal trachea.  Lidocaine was then used to anesthetize the patient's proximal airways.  An airway inspeciton was performed finding a small amount of thin, clear secretions as well as an accessory subsegment to the superior segment of the left lower lobe and scattered false diverticulum.   No endobronchial lesions or masses were visualized. The flexible bronchoscope was then removed from the patient's airways after suctioning of the remaining secretions.  The endobronchial ultrasound scope was then inserted into the posterior pharynx and between the vocal chords with ease.  An enlarged lymph node at level 4R was identified with endobronchial ultrasound.  However this lymph node could not be safely biopsied as it was overlying the pulmonary artery. The patient's right upper lobe paramediastinal mass was visualized under ultrasound. With a 22-gauge needle I attempted 4 separate to perform transbronchial needle aspiration however I was unable to penetrate the tracheal wall and no biopsy could be obtained. Hemostasis was directly visualized.  The remaining secretions were suctioned from the patient's airways and the endobronchial ultrasound scope was removed.  The bite block was removed and the patient was returned to the upright position.  Blood Loss:  Less than 10cc.  Complications:  None.  EBUS Procedure: Level 2R:  2.5cm mass / no biopsy performed - attempted 4 times  Level 4R:  1.0 cm lymph node / no biopsy performed  Level 4L:  No lymph node visualized Level 7:  No lymph node visualized  Post Procedure Stat Portable CXR:  Ordered and pending.  Post Procedure Instructions: Irven Coe spoke with the patient's brothers relaying the preliminary results of the procedure.  Instructed them that the patient is not to operate a car for 24 hours.  The patient should seek immediate medical attention if there is any persistent or progressive hemoptysis, difficulty breathing, or chest pain/discomfort.  The patient should also notify me immediately or seek medical attention for any purulent sputum production or fever occuring today or in the coming days.  The patient will be contacted  by me once the final results of the studies are available.  The patient should call my office if they have any  questions.

## 2014-10-29 NOTE — Telephone Encounter (Signed)
Referral placed per JN. Nothing further needed.

## 2014-10-29 NOTE — Anesthesia Postprocedure Evaluation (Signed)
  Anesthesia Post-op Note  Patient: Jared Gill  Procedure(s) Performed: Procedure(s): ATTEMPTED VIDEO BRONCHOSCOPY WITH ENDOBRONCHIAL ULTRASOUND (N/A)  Patient Location: PACU  Anesthesia Type:MAC  Level of Consciousness: awake, alert , oriented and patient cooperative  Airway and Oxygen Therapy: Patient Spontanous Breathing  Post-op Pain: none  Post-op Assessment: Post-op Vital signs reviewed, Patient's Cardiovascular Status Stable, Respiratory Function Stable, Patent Airway, No signs of Nausea or vomiting and Pain level controlled              Post-op Vital Signs: Reviewed and stable  Last Vitals:  Filed Vitals:   10/29/14 1038  BP:   Pulse:   Temp: 36.5 C  Resp:     Complications: No apparent anesthesia complications

## 2014-10-29 NOTE — Interval H&P Note (Signed)
There has been no change to the patient's exam or history since his initial visit with me.  [name]. Ashok Cordia, M.D. [facility] Pager:  [phone] After 3pm or if no response, call [phone]

## 2014-10-29 NOTE — H&P (View-Only) (Signed)
Subjective:    Patient ID: Jared Gill, male    DOB: 10/20/1940, 74 y.o.   MRN: [id number]  HPI He reports he went to be evaluated by his opthalmologist/optometrist about 3 weeks ago and had difficulty seeing out of his left eye. He went to see his PCP who found a brain lesion. He hadn't previously noticed any difficulty with his vision. He denies any headaches. Hadn't noticed any facial asymmetry. He reports he has had difficulty walking due to his vision but denies any focal weakness, numbness, or tingling. He denies any recurrent cough. Denies any hemoptysis. Denies any dyspnea. No chest pain or pressure. No fever, chills, or sweats. He has been sleeping more lately. He has lost about 4 pounds. He reports his appetite "comes and goes". Denies any dysphagia or odynophagia. Denies any bone pain. Denies any adenopathy in his neck, groin, or axilla.  Review of Systems No rashes or bruising. No dysuria or hematuria. No melena or hematochezia. A pertinent 14 point review of systems is negative except as per the history of presenting illness.  Allergies  Allergen Reactions  . Reglan [Metoclopramide]     ANXIETY, AGITATION, JITTERY   Current Outpatient Prescriptions on File Prior to Visit  Medication Sig Dispense Refill  . amLODipine (NORVASC) 5 MG tablet Take 5 mg by mouth daily.    . calcium carbonate (TUMS - DOSED IN MG ELEMENTAL CALCIUM) 500 MG chewable tablet Chew 1 tablet by mouth daily as needed for indigestion or heartburn.    . dexamethasone (DECADRON) 4 MG tablet Take 2 tablets (8 mg total) by mouth 3 (three) times daily. 180 tablet 1  . dicyclomine (BENTYL) 10 MG capsule Take 10 mg by mouth 2 (two) times daily.    [name] Kitchen doxazosin (CARDURA) 2 MG tablet Take 2 mg by mouth daily.    [name] Kitchen HYDROcodone-acetaminophen (NORCO/VICODIN) 5-325 MG per tablet Take 1 tablet by mouth every 6 (six) hours as needed. 20 tablet 0  . Linaclotide (LINZESS) 145 MCG CAPS capsule 1 PO WITH BREAKFAST 30 capsule 11    . LORazepam (ATIVAN) 1 MG tablet Take 1 tablet (1 mg total) by mouth every 8 (eight) hours. 30 tablet 0  . mirtazapine (REMERON) 15 MG tablet Take 1 tablet (15 mg total) by mouth at bedtime. 90 tablet 3  . Multiple Vitamin (MULTIVITAMIN) capsule Take 1 capsule by mouth daily.    [name] Kitchen omeprazole (PRILOSEC) 20 MG capsule 1 PO 30 MINS PRIOR TO BREAKFAST AND AT BEDTIME 180 capsule 3  . ondansetron (ZOFRAN ODT) 4 MG disintegrating tablet Take 1 tablet (4 mg total) by mouth every 6 (six) hours as needed for nausea. 30 tablet 3  . ondansetron (ZOFRAN) 4 MG tablet 1 PO AT BEDTIME TO PREVENT NAUSEA FIRST THING IN THE MORNING 90 tablet 3  . polyethylene glycol powder (GLYCOLAX/MIRALAX) powder Take 17 g by mouth daily as needed (for constipation).     [name] Kitchen tetrahydrozoline 0.05 % ophthalmic solution Place 1 drop into both eyes daily as needed (redness relief).     Current Facility-Administered Medications on File Prior to Visit  Medication Dose Route Frequency Provider Last Rate Last Dose  . fludeoxyglucose F - 18 (FDG) injection 10.33 milli Curie  10.33 milli Curie Intravenous Once PRN Medication Radiologist, MD   10.33 milli Curie at 10/21/14 1400   Past Medical History  Diagnosis Date  . Hypertension   . Abnormal MRI, spine 05/12/2011  . Nausea 01/2014  . BPH (benign prostatic hypertrophy)   .  Hx of adenomatous colonic polyps     h/o tubulovillous adenomas  . Diverticulitis   . Brain metastasis   . Metastasis to brain   . Lung mass     Right Upper Lobe  . GERD (gastroesophageal reflux disease)    Past Surgical History  Procedure Laterality Date  . Cholecystectomy  2009  . Polyp removal via colonoscopy    . Colonoscopy with esophagogastroduodenoscopy (egd) N/A 04/06/2013    NKN:LZJQBHAL size internal hemorrhoids/moderate diverticulosis/medium sized HH  . Tonsilectomy, adenoidectomy, bilateral myringotomy and tubes     Family History  Problem Relation Age of Onset  . Diabetes Father   .  Diabetes Sister   . Lung cancer Brother   . Colon cancer Neg Hx    Social History   Social History  . Marital Status: Single    Spouse Name: N/A  . Number of Children: 0  . Years of Education: N/A   Occupational History  . retired    Social History Main Topics  . Smoking status: Former Smoker -- 0.50 packs/day for 40 years    Types: Cigarettes    Quit date: 10/17/2009  . Smokeless tobacco: Never Used  . Alcohol Use: No     Comment: Remote EtOH  . Drug Use: No  . Sexual Activity: Not Asked   Other Topics Concern  . None   Social History Narrative   Originally from [place]. Previously worked as a Training and development officer at [employer]. He worked in the [employer]. He previously worked Audiological scientist for [employer] Has no pets currently. No mold exposure. No asbestos exposure.       Objective:   Physical Exam Blood pressure 132/70, pulse 67, weight 195 lb (88.451 kg), SpO2 98 %. General:  Awake. Alert. No acute distress. Accompanied by brother today. ASA class II. Integument:  Warm & dry. No rash on exposed skin. No bruising. Lymphatics:  No appreciated cervical or supraclavicular lymphadenoapthy. HEENT:  Moist mucus membranes. No oral ulcers. No scleral injection or icterus. Thrush present on soft palate. PERRL. Mallampati class III. Cardiovascular:  Regular rate. No edema. No appreciable JVD.  Pulmonary:  Good aeration & clear to auscultation bilaterally. Symmetric chest wall expansion. No accessory muscle use on room air. Abdomen: Soft. Normal bowel sounds. Nondistended. Grossly nontender. Musculoskeletal:  Normal bulk and tone. Hand grip, biceps, triceps, quadriceps, & hamstring strength 5/5 bilaterally. No joint deformity or effusion appreciated. Neurological:  Preserved peripheral visual fields on the left with decreased central vision. Normal right visual fields. Mild flattening of the forehead on the left with symmetric eyebrow rise. Mild flattening of the left  nasolabial fold. No meningismus. Moving all 4 extremities equally. Symmetric patellar & brachioradialis deep tendon reflexes. Patient does have some difficulty ambulating due to decreased visual fields. Psychiatric:  Mood and affect congruent. Speech normal rhythm, rate & tone.   IMAGING MRI BRAIN W/ & W/O 10/23/14 (per radiologist): 16 mm solid metastasis right midbrain enlarged by 4 mm since prior imaging 2 weeks ago. Increased mass effect on cerebral aqueduct but stable ventricular volume.  PET CT (personally reviewed by me with the patient today): Right upper lobe paratracheal mass at level 2R with hypermetabolic activity. Additional centrally located right upper lobe nodules with hypermetabolic activity. Hypermetabolic lymph nodes seen at level 4R. hypermetabolic lymph nodes in the right hilum and right supraclavicular areas as well. No pleural effusion or thickening. Hypermetabolic activity suggesting metastasis to the left fourth rib and right acetabulum.  LABS 10/07/14  CBC: 5.2/13.0/40.2/199 BMP: 140/4.3/106/26/15/1.13/98/9.3 LFT: 4.0/7.1/0.7/66/20/16    Assessment & Plan:  74 year old male with brain mass and right upper lobe mass that is hypermetabolic and highly suggestive of a primary lung cancer. I spent a significant amount of time today discussing the advanced stage of his probable lung cancer. We discussed the need for biopsy to determine the exact etiology of his malignancy which will allow [place] to better advise him regarding treatment options. To that and we discussed bronchoscopy with endobronchial ultrasound-guided fine-needle aspiration of either his right upper lobe mass or mediastinal lymph nodes. I discussed the risks of the procedure including bleeding, infection, pneumothorax, vocal cord injury, medication allergy, and potentially death. The patient's brother was present for this discussion as well. The patient is willing to undergo the procedure. Also note he has oral thrush on  exam today which is likely secondary to the steroids for his intracerebral mass. I instructed the patient contact my office if he had any further questions or concerns before his next appointment.  1. Right upper lobe lung mass: Plan for bronchoscopy with airway exam and endobronchial ultrasound exam with probable biopsy on 9/7 or as soon as possible. 2. Brain mass: Patient follows with Dr. Camelia Eng. Planned to begin XRT. Currently on Decadron. 3. Oral thrush: Prescribing nystatin swish and swallow. 4. Follow-up: Patient will return to clinic in 2 weeks for follow-up after his procedure.

## 2014-10-29 NOTE — Progress Notes (Signed)
Spoke with Mr Kachel's sister in law at [phone] / Mr Bady will go to [facility] for 1 view chest xr/ arranged with [facility] staff/radiology(Regina) to call me in [facility] when completed

## 2014-10-29 NOTE — Transfer of Care (Signed)
Immediate Anesthesia Transfer of Care Note  Patient: Jared Gill  Procedure(s) Performed: Procedure(s): ATTEMPTED VIDEO BRONCHOSCOPY WITH ENDOBRONCHIAL ULTRASOUND (N/A)  Patient Location: PACU  Anesthesia Type:MAC  Level of Consciousness: awake, alert , oriented and patient cooperative  Airway & Oxygen Therapy: Patient Spontanous Breathing and Patient connected to face mask oxygen  Post-op Assessment: Report given to RN, Post -op Vital signs reviewed and stable, Patient moving all extremities and Patient moving all extremities X 4  Post vital signs: Reviewed and stable  Last Vitals:  Filed Vitals:   10/29/14 1019  BP: 165/84  Pulse: 94  Temp: 36.5 C  Resp: 19    Complications: No apparent anesthesia complications

## 2014-10-29 NOTE — Discharge Instructions (Signed)
The patient should seek immediate medical attention if there is any persistent or progressive hemoptysis, difficulty breathing, or chest pain/discomfort.  The patient should also notify me immediately or seek medical attention for any purulent sputum production or fever occuring today or in the coming days.

## 2014-10-29 NOTE — Telephone Encounter (Signed)
-----   Message from Javier Glazier, MD sent at 10/29/2014  3:23 PM EDT ----- Patient needs refer to CT Surgery to be seen ASAP for mediastinal lymph node/mass biopsy. Unable to perform biopsy via EBUS. Thanks.

## 2014-10-30 ENCOUNTER — Ambulatory Visit
Admission: RE | Admit: 2014-10-30 | Payer: Commercial Managed Care - HMO | Source: Ambulatory Visit | Admitting: Radiation Oncology

## 2014-10-30 ENCOUNTER — Ambulatory Visit
Admission: RE | Admit: 2014-10-30 | Discharge: 2014-10-30 | Disposition: A | Discharge status: Home / Self Care | Payer: Commercial Managed Care - HMO | Source: Ambulatory Visit | Attending: Radiation Oncology | Admitting: Radiation Oncology

## 2014-10-30 ENCOUNTER — Encounter (HOSPITAL_COMMUNITY): Payer: Self-pay | Admitting: Pulmonary Disease

## 2014-10-30 VITALS — BP 132/79 | HR 95 | Temp 98.1°F | Resp 12 | Wt 187.6 lb

## 2014-10-30 DIAGNOSIS — C7931 Secondary malignant neoplasm of brain: Secondary | ICD-10-CM

## 2014-10-30 DIAGNOSIS — Z510 Encounter for antineoplastic radiation therapy: Secondary | ICD-10-CM | POA: Diagnosis not present

## 2014-10-30 NOTE — Progress Notes (Signed)
  Radiation Oncology         [phone] ________________________________  Stereotactic Treatment Procedure Note  Name: Jared Gill MRN: [id number]  Date: 10/30/2014  DOB: [birth year]  SPECIAL TREATMENT PROCEDURE  3D TREATMENT PLANNING AND DOSIMETRY:  The patient's radiation plan was reviewed and approved by neurosurgery and radiation oncology prior to treatment.  It showed 3-dimensional radiation distributions overlaid onto the planning CT/MRI image set.  The [facility] for the target structures as well as the organs at risk were reviewed. The documentation of the 3D plan and dosimetry are filed in the radiation oncology EMR.  NARRATIVE:  Jared Gill was brought to the TrueBeam stereotactic radiation treatment machine and placed supine on the CT couch. The head frame was applied, and the patient was set up for stereotactic radiosurgery.  Neurosurgery was present for the set-up and delivery  SIMULATION VERIFICATION:  In the couch zero-angle position, the patient underwent Exactrac imaging using the Brainlab system with orthogonal KV images.  These were carefully aligned and repeated to confirm treatment position for each of the isocenters.  The Exactrac snap film verification was repeated at each couch angle.  SPECIAL TREATMENT PROCEDURE: Jared Gill received stereotactic radiosurgery to the following targets: Right Midbrain 21m target was treated using 5 Dynamic Conformal Arcs to a prescription dose of 7 Gy.  ExacTrac Snap verification was performed for each couch angle.    This constitutes a special treatment procedure due to the ablative dose delivered and the technical nature of treatment.  This highly technical modality of treatment ensures that the ablative dose is centered on the patient's tumor while sparing normal tissues from excessive dose and risk of detrimental effects.  STEREOTACTIC TREATMENT MANAGEMENT:  Following delivery, the patient was transported to nursing in stable  condition and monitored for possible acute effects.  Filed Vitals:   10/30/14 1137  BP: 132/79  Pulse: 95  Temp: 98.1 F (36.7 C)  Resp: 12   The patient tolerated treatment without significant acute effects, and was discharged to home in stable condition.    PLAN: Follow-up in 2 days for 3rd fraction. ________________________________   SEppie Gibson MD

## 2014-10-30 NOTE — Progress Notes (Signed)
Pt here post [facility] treatment.  VS assessed upon arrival to nursing unit and immediately prior leaving.  Pt denies Headache, nausea or vomiting.  Pt report visual blurring but reports this isn't new.  He is taking continued Decadron.  He has developed thrush, he continues on Diflucan as directed.  Instruction given about oral care and denture care.  Pt also reports issues with constipation.  Constipation handout given with instruction.

## 2014-10-30 NOTE — Progress Notes (Signed)

## 2014-10-31 ENCOUNTER — Encounter (HOSPITAL_COMMUNITY): Payer: Self-pay | Admitting: Oncology

## 2014-10-31 ENCOUNTER — Encounter (HOSPITAL_COMMUNITY): Payer: Commercial Managed Care - HMO | Attending: Hematology & Oncology | Admitting: Oncology

## 2014-10-31 VITALS — BP 145/78 | HR 75 | Temp 97.6°F | Resp 18 | Wt 186.5 lb

## 2014-10-31 DIAGNOSIS — C7931 Secondary malignant neoplasm of brain: Secondary | ICD-10-CM

## 2014-10-31 NOTE — Progress Notes (Signed)
Jared Gill, [place] [address]  Brain metastasis  CURRENT THERAPY: SRS  INTERVAL HISTORY: Jared Gill 74 y.o. male returns after being referred back to the [facility] with newly diagnosed brain lesion.      Brain metastasis   10/18/2014 Initial Diagnosis Brain metastasis   10/25/2014 -  Radiation Therapy SRS   On the patient's last visit, I had plans to send the patient to IR for biopsy to confirm diagnosis, but after conversation with Dr. Isidore Gill, we had decided to pursue pulmonology consult with biopsy.  Jared Gill ([place]) graciously worked the patient in on his schedule ASAP without interfering with urgent SRS therapy on 10/24/2014.  His plan follows below: 1. Right upper lobe lung mass: Plan for bronchoscopy with airway exam and endobronchial ultrasound exam with probable biopsy on 9/7 or as soon as possible. 2. Brain mass: Patient follows with Dr. Camelia Gill. Planned to begin XRT. Currently on Decadron. 3. Oral thrush: Prescribing nystatin swish and swallow. 4. Follow-up: Patient will return to clinic in 2 weeks for follow-up after his procedure.       I received word via [facility] messaging from Jared Gill on 9/6: Fyi. Couldn't get the biopsy via EBUS. Referring him to CT surgery for mediastinoscopy to get tissue.  Jared Gill.    He reports some mild neurologic improvement.  He strength in minimally improved in L UE.  He still reports double vision and wants to keep his right eye closed.  Other than vision disturbance, he denies any complaints.     Past Medical History  Diagnosis Date  . Hypertension   . Abnormal MRI, spine 05/12/2011  . Nausea 01/2014  . BPH (benign prostatic hypertrophy)   . Hx of adenomatous colonic polyps     h/o tubulovillous adenomas  . Diverticulitis   . Brain metastasis   . Metastasis to brain   . Lung mass     Right Upper Lobe  . GERD (gastroesophageal reflux disease)   . Blurred vision     has Anxiety state;  HYPERTENSION; GERD; COLONIC POLYPS, HX OF; Abnormal MRI, spine; Lipoma of axilla; Unintentional weight loss of more than 10 pounds; Gastroparesis; Microcytic anemia; Constipation; Brain metastasis; and Lung mass on his problem list.     is allergic to reglan.  Current Outpatient Prescriptions on File Prior to Visit  Medication Sig Dispense Refill  . amLODipine (NORVASC) 5 MG tablet Take 5 mg by mouth daily.    . calcium carbonate (TUMS - DOSED IN MG ELEMENTAL CALCIUM) 500 MG chewable tablet Chew 1 tablet by mouth daily as needed for indigestion or heartburn.    . dexamethasone (DECADRON) 4 MG tablet Take 2 tablets (8 mg total) by mouth 3 (three) times daily. 180 tablet 1  . dicyclomine (BENTYL) 10 MG capsule Take 10 mg by mouth 2 (two) times daily.    [name] Kitchen doxazosin (CARDURA) 2 MG tablet Take 2 mg by mouth daily.    . fluconazole (DIFLUCAN) 100 MG tablet Take 2 tablets today, then 1 tablet daily x 20 more days. 22 tablet 0  . HYDROcodone-acetaminophen (NORCO/VICODIN) 5-325 MG per tablet Take 1 tablet by mouth every 6 (six) hours as needed. 20 tablet 0  . Linaclotide (LINZESS) 145 MCG CAPS capsule 1 PO WITH BREAKFAST 30 capsule 11  . LORazepam (ATIVAN) 1 MG tablet Take 1 tablet (1 mg total) by mouth every 8 (eight) hours. 30 tablet 0  . mirtazapine (REMERON) 15 MG  tablet Take 1 tablet (15 mg total) by mouth at bedtime. 90 tablet 3  . Multiple Vitamin (MULTIVITAMIN) capsule Take 1 capsule by mouth daily.    [name] Kitchen nystatin (MYCOSTATIN) 100000 UNIT/ML suspension Take 5 mLs (500,000 Units total) by mouth 3 (three) times daily. X 7 days 105 mL 0  . omeprazole (PRILOSEC) 20 MG capsule 1 PO 30 MINS PRIOR TO BREAKFAST AND AT BEDTIME 180 capsule 3  . ondansetron (ZOFRAN ODT) 4 MG disintegrating tablet Take 1 tablet (4 mg total) by mouth every 6 (six) hours as needed for nausea. 30 tablet 3  . polyethylene glycol powder (GLYCOLAX/MIRALAX) powder Take 17 g by mouth daily as needed (for constipation).     [name] Kitchen  tetrahydrozoline 0.05 % ophthalmic solution Place 1 drop into both eyes daily as needed (redness relief).     No current facility-administered medications on file prior to visit.    Past Surgical History  Procedure Laterality Date  . Cholecystectomy  2009  . Polyp removal via colonoscopy    . Colonoscopy with esophagogastroduodenoscopy (egd) N/A 04/06/2013    OXB:[id number] size internal hemorrhoids/moderate diverticulosis/medium sized HH  . Tonsilectomy, adenoidectomy, bilateral myringotomy and tubes    . Video bronchoscopy with endobronchial ultrasound N/A 10/29/2014    Procedure: ATTEMPTED VIDEO BRONCHOSCOPY WITH ENDOBRONCHIAL ULTRASOUND;  Surgeon: Jared Glazier, MD;  Location: [facility];  Service: Thoracic;  Laterality: N/A;    Denies any fevers, chills, night sweats, nausea, vomiting, diarrhea, constipation, chest pain, heart palpitations, shortness of breath, blood in stool, black tarry stool, urinary pain, urinary burning, urinary frequency, hematuria.   PHYSICAL EXAMINATION  ECOG PERFORMANCE STATUS: 2 - Symptomatic, <50% confined to bed  Filed Vitals:   10/31/14 1000  BP: 145/78  Pulse: 75  Temp: 97.6 F (36.4 C)  Resp: 18    GENERAL:alert, no distress, anxious and accompanied by brother. SKIN: skin color, texture, turgor are normal HEAD: No masses, lesions, tenderness, facial asymmetry with left facial droop (improved some) EYES: sclera clear, right exophthalmos, desire to close right eye, left eyelid weakness EARS: External ears normal OROPHARYNX:lips, buccal mucosa, and tongue normal and white patches on buccal mucosa B/L NECK: supple, no adenopathy, trachea midline LYMPH:  no palpable lymphadenopathy BREAST:not examined LUNGS: clear to auscultation  HEART: regular rate & rhythm ABDOMEN:abdomen soft, non-tender and normal bowel sounds BACK: Back symmetric, no curvature., No CVA tenderness EXTREMITIES:less then 2 second capillary refill, no joint deformities,  effusion, or inflammation, no skin discoloration, positive findings:  Left upper extremity strength is significantly decreased.  Lower extremity strength is 5/5 and equal bilaterally. NEURO: as dictated above. Alert.    LABORATORY DATA: CBC    Component Value Date/Time   WBC 13.9* 10/29/2014 0734   RBC 6.17* 10/29/2014 0734   HGB 14.7 10/29/2014 0734   HCT 42.9 10/29/2014 0734   PLT 131* 10/29/2014 0734   MCV 69.5* 10/29/2014 0734   MCH 23.8* 10/29/2014 0734   MCHC 34.3 10/29/2014 0734   RDW 16.3* 10/29/2014 0734   LYMPHSABS 0.9 10/07/2014 1106   MONOABS 0.6 10/07/2014 1106   EOSABS 0.1 10/07/2014 1106   BASOSABS 0.0 10/07/2014 1106      Chemistry      Component Value Date/Time   NA 133* 10/29/2014 0734   K 4.2 10/29/2014 0734   CL 101 10/29/2014 0734   CO2 22 10/29/2014 0734   BUN 20 10/29/2014 0734   CREATININE 1.08 10/29/2014 0734   CREATININE 1.00 02/21/2014 1323      Component  Value Date/Time   CALCIUM 8.7* 10/29/2014 0734   ALKPHOS 66 10/07/2014 1106   AST 20 10/07/2014 1106   ALT 16* 10/07/2014 1106   BILITOT 0.7 10/07/2014 1106        PENDING LABS:   RADIOGRAPHIC STUDIES:  Dg Chest 1 View  10/29/2014   CLINICAL DATA:  Attempted lung biopsy  EXAM: CHEST  1 VIEW  COMPARISON:  PET-CT of 10/21/2014 and CT chest of 10/10/2014  FINDINGS: The right upper paramediastinal soft tissue mass noted by CT is not well seen by chest x-ray. No pneumothorax is noted. No effusion is seen. Heart size is stable.  IMPRESSION: No pneumothorax. The right upper paramediastinal soft tissue mass is not well seen by chest x-ray.   Electronically Signed   By: Ivar Drape M.D.   On: 10/29/2014 13:28   Ct Chest W Contrast  10/10/2014   CLINICAL DATA:  Brain lesion on recent MRI  EXAM: CT CHEST, ABDOMEN, AND PELVIS WITH CONTRAST  TECHNIQUE: Multidetector CT imaging of the chest, abdomen and pelvis was performed following the standard protocol during bolus administration of intravenous  contrast.  CONTRAST:  18m OMNIPAQUE IOHEXOL 300 MG/ML  SOLN  COMPARISON:  CT scan abdomen and pelvis 04/29/2014 and CT scan of the chest 09/20/2012  FINDINGS: CT CHEST FINDINGS  Sagittal images of the spine shows mild degenerative changes thoracic spine. Sagittal images of the sternum are unremarkable.  Images of the thoracic inlet are unremarkable.  Central airways are patent. Mild thickening of mid esophageal wall. A precarinal lymph node measures 1.7 by 1.3 cm in diameter. There is a right hilar lymph node measures 1.4 by 1.7 cm.  There is a right upper lobe posterior medial lesion abutting/ close proximity with right paratracheal region right upper mediastinum. The lesion measures at least 2.5 x 3 cm. This is best seen in coronal image 65 measures at least 3.9 cm cranial caudally.  This is highly suspicious for primary malignancy. There is a second nodular density in right upper lobe sent is a centrally suprahilar region measures 1.4 cm suspicious for satellite lesion.  A nodule in right middle lobe measures 1 cm suspicious for metastatic disease. No left lung nodules are noted. Mild emphysematous changes are noted bilateral upper lobe. No acute infiltrate or pulmonary edema.  No destructive rib lesions are noted.  CT ABDOMEN AND PELVIS FINDINGS  Degenerative changes are noted lumbar spine. Atherosclerotic calcifications of abdominal aorta. No aortic aneurysm.  Enhanced liver is unremarkable. Status postcholecystectomy. The pancreas, spleen and adrenal glands are unremarkable.  Kidneys are symmetrical in size and enhancement. No hydronephrosis or hydroureter.  Abundant stool noted within cecum. No pericecal inflammation. Normal appendix is noted in axial image 85. Multiple descending colon diverticula. Multiple sigmoid colon diverticula. No evidence of acute diverticulitis.  There is no small bowel obstruction. No ascites or free air. No adenopathy. Prostate gland and seminal vesicles are unremarkable. Small  nonspecific inguinal lymph nodes are noted. Degenerative changes pubic symphysis.  IMPRESSION: 1. There is a right upper lobe/right mediastinal mass measures at least 2.5 x 3 x 4 cm. This is highly suspicious for primary malignancy. Pathologic lymph nodes are noted precarinal region and right hilum. Nodular suprahilar lesion right upper lobe suspicious for satellite lesion. A nodule in right middle lobe measures 1 cm suspicious for metastatic disease. No left hilar adenopathy. No left lung lesions. 2. No acute infiltrate or pulmonary edema. Bilateral mild emphysematous changes. 3. Extensive atherosclerotic calcifications of abdominal aorta. 4. No  evidence of metastatic disease within abdomen. 5. Abundant stool noted within cecum.  No pericecal inflammation. 6. Multiple colonic diverticula descending colon and sigmoid colon. No evidence of acute diverticulitis. 7. Unremarkable prostate gland and urinary bladder. 8. Small nonspecific bilateral inguinal lymph nodes.  These results were called by telephone at the time of interpretation on 10/10/2014 at 4:26 pm to Dr. Asencion Gill , who verbally acknowledged these results.   Electronically Signed   By: Lahoma Crocker M.D.   On: 10/10/2014 16:27   Mr Jeri Cos IO Contrast  10/23/2014   CLINICAL DATA:  Brain metastasis. SRS protocol for treatment planning.  EXAM: MRI HEAD WITHOUT AND WITH CONTRAST  TECHNIQUE: Multiplanar, multiecho pulse sequences of the brain and surrounding structures were obtained without and with intravenous contrast.  CONTRAST:  62m MULTIHANCE GADOBENATE DIMEGLUMINE 529 MG/ML IV SOLN  COMPARISON:  10/07/2014  FINDINGS: Calvarium and upper cervical spine: No focal marrow signal abnormality.  Orbits: No evidence of metastasis.  Sinuses and Mastoids: Mucosal thickening with bilateral maxillary sinus secretion retention. Mucous layers within the nasopharynx. Mastoid and middle ears are clear.  Brain: Solitary mass in the right midbrain, between the cerebral  aqua duct and cerebral peduncle, 4 mm larger than previous at 16 mm diameter. There is associated increase in cerebral aqua duct mass effect, but no change ventricular volume to suggest obstructive hydrocephalus. No periventricular edema. Mild vasogenic edema around the mass is stable. No other evidence of intracranial disease.  Mild for age chronic small vessel ischemia in the bilateral cerebral white matter. Generalized cerebral volume loss. No acute infarct, acute hemorrhage, or major vessel occlusion.  IMPRESSION: 16 mm solitary metastasis in the right midbrain has grown by 4 mm since 2 weeks ago. Increased mass effect on the cerebral aqua duct but stable ventricular volume.   Electronically Signed   By: JMonte FantasiaM.D.   On: 10/23/2014 10:37   Mr BJeri CosWXBContrast  10/07/2014   CLINICAL DATA:  Weakness and double vision, 2 days duration.  EXAM: MRI HEAD WITHOUT AND WITH CONTRAST  TECHNIQUE: Multiplanar, multiecho pulse sequences of the brain and surrounding structures were obtained without and with intravenous contrast.  CONTRAST:  110mMULTIHANCE GADOBENATE DIMEGLUMINE 529 MG/ML IV SOLN  COMPARISON:  None.  FINDINGS: There is a 12 mm ring-enhancing lesion within the midbrain/cerebral peduncle on the right with surrounding edema. No restricted diffusion. No second lesion seen elsewhere.  Diffusion imaging does not show acute or subacute infarction. No cerebellar abnormality. The cerebral hemispheres show mild age related volume loss without evidence of old small or large vessel infarction. No hydrocephalus or extra-axial collection. No pituitary mass. No skull or skullbase lesion. There are some inflammatory changes affecting the maxillary sinuses.  IMPRESSION: 12 mm ring-enhancing lesion in the right midbrain/cerebral peduncle with surrounding edema. 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 The differential diagnosis does include primary brain tumor and abscess, but those are less likely. Solitary  demyelinating lesion is least likely   Electronically Signed   By: MaNelson Chimes.D.   On: 10/07/2014 12:23   Ct Abdomen Pelvis W Contrast  10/10/2014   CLINICAL DATA:  Brain lesion on recent MRI  EXAM: CT CHEST, ABDOMEN, AND PELVIS WITH CONTRAST  TECHNIQUE: Multidetector CT imaging of the chest, abdomen and pelvis was performed following the standard protocol during bolus administration of intravenous contrast.  CONTRAST:  10085mMNIPAQUE IOHEXOL 300 MG/ML  SOLN  COMPARISON:  CT scan abdomen and pelvis 04/29/2014 and  CT scan of the chest 09/20/2012  FINDINGS: CT CHEST FINDINGS  Sagittal images of the spine shows mild degenerative changes thoracic spine. Sagittal images of the sternum are unremarkable.  Images of the thoracic inlet are unremarkable.  Central airways are patent. Mild thickening of mid esophageal wall. A precarinal lymph node measures 1.7 by 1.3 cm in diameter. There is a right hilar lymph node measures 1.4 by 1.7 cm.  There is a right upper lobe posterior medial lesion abutting/ close proximity with right paratracheal region right upper mediastinum. The lesion measures at least 2.5 x 3 cm. This is best seen in coronal image 65 measures at least 3.9 cm cranial caudally.  This is highly suspicious for primary malignancy. There is a second nodular density in right upper lobe sent is a centrally suprahilar region measures 1.4 cm suspicious for satellite lesion.  A nodule in right middle lobe measures 1 cm suspicious for metastatic disease. No left lung nodules are noted. Mild emphysematous changes are noted bilateral upper lobe. No acute infiltrate or pulmonary edema.  No destructive rib lesions are noted.  CT ABDOMEN AND PELVIS FINDINGS  Degenerative changes are noted lumbar spine. Atherosclerotic calcifications of abdominal aorta. No aortic aneurysm.  Enhanced liver is unremarkable. Status postcholecystectomy. The pancreas, spleen and adrenal glands are unremarkable.  Kidneys are symmetrical in size  and enhancement. No hydronephrosis or hydroureter.  Abundant stool noted within cecum. No pericecal inflammation. Normal appendix is noted in axial image 85. Multiple descending colon diverticula. Multiple sigmoid colon diverticula. No evidence of acute diverticulitis.  There is no small bowel obstruction. No ascites or free air. No adenopathy. Prostate gland and seminal vesicles are unremarkable. Small nonspecific inguinal lymph nodes are noted. Degenerative changes pubic symphysis.  IMPRESSION: 1. There is a right upper lobe/right mediastinal mass measures at least 2.5 x 3 x 4 cm. This is highly suspicious for primary malignancy. Pathologic lymph nodes are noted precarinal region and right hilum. Nodular suprahilar lesion right upper lobe suspicious for satellite lesion. A nodule in right middle lobe measures 1 cm suspicious for metastatic disease. No left hilar adenopathy. No left lung lesions. 2. No acute infiltrate or pulmonary edema. Bilateral mild emphysematous changes. 3. Extensive atherosclerotic calcifications of abdominal aorta. 4. No evidence of metastatic disease within abdomen. 5. Abundant stool noted within cecum.  No pericecal inflammation. 6. Multiple colonic diverticula descending colon and sigmoid colon. No evidence of acute diverticulitis. 7. Unremarkable prostate gland and urinary bladder. 8. Small nonspecific bilateral inguinal lymph nodes.  These results were called by telephone at the time of interpretation on 10/10/2014 at 4:26 pm to Dr. Asencion Gill , who verbally acknowledged these results.   Electronically Signed   By: Lahoma Crocker M.D.   On: 10/10/2014 16:27   Nm Pet Image Initial (pi) Skull Base To Thigh  10/21/2014   CLINICAL DATA:  Initial treatment strategy for right lung mass with brain metastases.  EXAM: NUCLEAR MEDICINE PET SKULL BASE TO THIGH  TECHNIQUE: 10.3 mCi F-18 FDG was injected intravenously. Full-ring PET imaging was performed from the skull base to thigh after the  radiotracer. CT data was obtained and used for attenuation correction and anatomic localization.  FASTING BLOOD GLUCOSE:  Value: 108 mg/dl  COMPARISON:  CT chest abdomen pelvis dated 10/10/2014.  FINDINGS: NECK  No hypermetabolic lymph nodes in the neck.  CHEST  3.3 x 2.8 cm medial right upper lobe mass (series 4/ image 53), max SUV 11.9.  Two central right upper lobe  nodules measuring 1.3 and 2.0 cm (series 6/ image 23), max SUV 9.9.  Nodularity along the pleural fat with focal hypermetabolism overlying the right 10th-11th rib interspace, max SUV 6.5.  Associated thoracic lymphadenopathy, including:  --6 mm short axis right supraclavicular node (series 4/ image 47), max SUV 5.3  --13 mm short axis right paratracheal node (series 4/ image 66), max SUV 19.1  --12 mm short axis right hilar node (series 4/ image 36), max SUV 13.0  ABDOMEN/PELVIS  No abnormal hypermetabolic activity within the liver, pancreas, adrenal glands, or spleen.  Status post cholecystectomy. Atherosclerotic calcifications of the abdominal aorta and branch vessels.  No hypermetabolic lymph nodes in the abdomen or pelvis.  SKELETON  Focal hypermetabolism involving the anterior column of the right acetabulum, max SUV 5.7, although without CT correlate.  Focal hypermetabolism involving the left anterior 4th rib, max SUV 5.8.  IMPRESSION: 3.3 cm medial right upper lobe mass with two central right upper lobe nodules measuring up to 2.0 cm, suspicious for primary bronchogenic neoplasm with a pancoast tumor.  Associated right supraclavicular, mediastinal, and right hilar nodal metastases.  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   Electronically Signed   By: Julian Hy M.D.   On: 10/21/2014 16:20     PATHOLOGY:    ASSESSMENT AND PLAN:  Brain metastasis Newly discovered brain metastasis with CT CAP confirming metastatic disease (excluding primary brain tumor) with significant  intrathoracic disease.  PET scan confirms hypermetabolic activity.    Now undergoing SRS by Dr. Isidore Gill without biopsy confirmation due to significant and quick neurological decline.   He is currently on  Dexamethasone 8 mg every 8 hours.  He is to continue this regimen and this will be managed by Radiation Oncology.  Patient seen by pulmonology, Jared Gill on 10/24/2014 with plans for biopsy.  Unable to be completed.  I received a forwarded message from Dr. Isidore Gill reporting that Jared Gill was going to refer the patient to CTS for biopsy.  Prior to this, Dr. Roxan Hockey has been made aware of this patient and he is familiar with the situation.  I have sent a message to Dr. Isidore Gill to see if she is privy to information confirming whether CTS referral was made.  I do not see any appointments for CTS consultation.  I have messaged Dr. Roxan Hockey who is familiar with this patient's case due to our past conversations with him.  REFER to CTS  Oral candidiasis noted on exam.  On diflucan and Nystatin by Dr. Isidore Gill.  Continue SRS as directed.  Return in 1.5- 2 weeks for follow-up.  The patient and his brother know to call us with any issues.    THERAPY PLAN:  SRS as planned by Dr. Isidore Gill.  Referral to CTS for biopsy.  All questions were answered. The patient knows to call the clinic with any problems, questions or concerns. We can certainly see the patient much sooner if necessary.  Patient and plan discussed with Dr. Ancil Linsey and she is in agreement with the aforementioned.   This note is electronically signed by: Doy Mince 10/31/2014 11:27 AM

## 2014-10-31 NOTE — Patient Instructions (Signed)
[place] at [facility] Discharge Instructions  RECOMMENDATIONS MADE BY THE CONSULTANT AND ANY TEST RESULTS WILL BE SENT TO YOUR REFERRING PHYSICIAN.  Exam done and seen by Kirby Crigler PA-C Return in 2weeks for follow up Refer to Cardio thoracic surgery  Thank you for choosing [place] at [facility] to provide your oncology and hematology care.  To afford each patient quality time with our provider, please arrive at least 15 minutes before your scheduled appointment time.    You need to re-schedule your appointment should you arrive 10 or more minutes late.  We strive to give you quality time with our providers, and arriving late affects you and other patients whose appointments are after yours.  Also, if you no show three or more times for appointments you may be dismissed from the clinic at the providers discretion.     Again, thank you for choosing [facility].  Our hope is that these requests will decrease the amount of time that you wait before being seen by our physicians.       _____________________________________________________________  Should you have questions after your visit to [facility], please contact our office at [phone] between the hours of 8:30 a.m. and 4:30 p.m.  Voicemails left after 4:30 p.m. will not be returned until the following business day.  For prescription refill requests, have your pharmacy contact our office.

## 2014-10-31 NOTE — Assessment & Plan Note (Addendum)
Newly discovered brain metastasis with CT CAP confirming metastatic disease (excluding primary brain tumor) with significant intrathoracic disease.  PET scan confirms hypermetabolic activity.    Now undergoing SRS by Dr. Isidore Moos without biopsy confirmation due to significant and quick neurological decline.   He is currently on  Dexamethasone 8 mg every 8 hours.  He is to continue this regimen and this will be managed by Radiation Oncology.  Patient seen by pulmonology, Dr. Ashok Cordia on 10/24/2014 with plans for biopsy.  Unable to be completed.  I received a forwarded message from Dr. Isidore Moos reporting that Dr. Ashok Cordia was going to refer the patient to CTS for biopsy.  Prior to this, Dr. Roxan Hockey has been made aware of this patient and he is familiar with the situation.  I have sent a message to Dr. Isidore Moos to see if she is privy to information confirming whether CTS referral was made.  I do not see any appointments for CTS consultation.  I have messaged Dr. Roxan Hockey who is familiar with this patient's case due to our past conversations with him.  REFER to CTS  Oral candidiasis noted on exam.  On diflucan and Nystatin by Dr. Isidore Moos.  Continue SRS as directed.  Return in 1.5- 2 weeks for follow-up.  The patient and his brother know to call us with any issues.

## 2014-11-01 ENCOUNTER — Ambulatory Visit
Admission: RE | Admit: 2014-11-01 | Discharge: 2014-11-01 | Disposition: A | Discharge status: Home / Self Care | Payer: Commercial Managed Care - HMO | Source: Ambulatory Visit | Attending: Radiation Oncology | Admitting: Radiation Oncology

## 2014-11-01 ENCOUNTER — Encounter: Payer: Self-pay | Admitting: Radiation Therapy

## 2014-11-01 ENCOUNTER — Encounter: Payer: Self-pay | Admitting: Radiation Oncology

## 2014-11-01 VITALS — BP 152/87 | HR 81 | Temp 97.3°F | Resp 20 | Wt 187.3 lb

## 2014-11-01 DIAGNOSIS — C7931 Secondary malignant neoplasm of brain: Secondary | ICD-10-CM

## 2014-11-01 DIAGNOSIS — Z510 Encounter for antineoplastic radiation therapy: Secondary | ICD-10-CM | POA: Diagnosis not present

## 2014-11-01 MED ORDER — DEXAMETHASONE 4 MG PO TABS: 8.0000 mg | ORAL_TABLET | Freq: Two times a day (BID) | ORAL | Status: DC

## 2014-11-01 NOTE — Addendum Note (Signed)
Encounter addended by: Eppie Gibson, MD on: 11/01/2014  2:40 PM<BR>     Documentation filed: Medications, Orders

## 2014-11-01 NOTE — Progress Notes (Addendum)
  Radiation Oncology         ([phone]) [phone] ________________________________  Stereotactic Treatment Procedure Note  Name: Jared Gill MRN: [id number]  Date: 11/01/2014  DOB: [birth year]  SPECIAL TREATMENT PROCEDURE Outpatient   ICD-9-CM ICD-10-CM   1. Brain metastasis 198.3 C79.31     3D TREATMENT PLANNING AND DOSIMETRY:  The patient's radiation plan was reviewed and approved by neurosurgery and radiation oncology prior to treatment.  It showed 3-dimensional radiation distributions overlaid onto the planning CT/MRI image set.  The [facility] for the target structures as well as the organs at risk were reviewed. The documentation of the 3D plan and dosimetry are filed in the radiation oncology EMR.  NARRATIVE:  [name] was brought to the TrueBeam stereotactic radiation treatment machine and placed supine on the CT couch. The head frame was applied, and the patient was set up for stereotactic radiosurgery.  Neurosurgery was present for the set-up and delivery  SIMULATION VERIFICATION:  In the couch zero-angle position, the patient underwent Exactrac imaging using the Brainlab system with orthogonal KV images.  These were carefully aligned and repeated to confirm treatment position for each of the isocenters.  The Exactrac snap film verification was repeated at each couch angle.  SPECIAL TREATMENT PROCEDURE: [name] received stereotactic radiosurgery to the following targets:  Right Midbrain 59m target was treated using 5 Dynamic Conformal Arcs to a prescription dose of 7 Gy.  ExacTrac Snap verification was performed for each couch angle.  This constitutes a special treatment procedure due to the ablative dose delivered and the technical nature of treatment.  This highly technical modality of treatment ensures that the ablative dose is centered on the patient's tumor while sparing normal tissues from excessive dose and risk of detrimental effects.  STEREOTACTIC TREATMENT MANAGEMENT:   Following delivery, the patient was transported to nursing in stable condition and monitored for possible acute effects.  Vital signs were recorded BP 152/87 mmHg  Pulse 81  Temp(Src) 97.3 F (36.3 C) (Oral)  Resp 20  Wt 187 lb 4.8 oz (84.959 kg). The patient tolerated treatment without significant acute effects, and was discharged to home in stable condition.    PLAN: Follow-up in one half month. Taper to Dexamethasone '8mg'$  BID.  ________________________________   SEppie Gibson MD

## 2014-11-01 NOTE — Progress Notes (Signed)
[name] completed his third fraction of SRT today without any issues. We reviewed his steroid doses as well as the Diflucan to continue treating his thrush. Per the family he just began taking the Diflucan on 9/7 following his second treatment. His mouth is already looking much better. I encouraged [name] and the three gentleman who were here with him, that he must complete the entire prescription.         When questioned about [name] steroid dose, his family was not sure what he has been taking. Stating they think [name] is taking one green pill once a day. Per Dr. Donald Pore prescription he should have been taking 8 mg TID. Dr. Isidore Moos asked that the patient start taking mg in the morning and mg in the evening with food. I wrote this down as well as the name of the medication so the family would be able to accurately mark the bottle when they get home. Everyone was happy with this plan and verbalized understanding of what [name] should be taking and how.   He will be back to see Dr. Isidore Moos in 2 weeks for follow-up. On 9/23.   Mont Dutton R.T.(R).(T). Special Procedures Navigator

## 2014-11-01 NOTE — Progress Notes (Addendum)
3/3 SRS brain , vitals taken, no c/o pain, or nausea , no new vision changes,  No fever, weakness yes,unsteady, in w/c, wearing fall risk yellow bracelet,monitor 15 minutes, started diflucan 10/30/14, stil has thrush on tongue,stil taking decadron,  Call MD if fever,increased symptoms not normal for you occurs 1:04 PM BP 152/87 mmHg  Pulse 81  Temp(Src) 97.3 F (36.3 C) (Oral)  Resp 20  Wt 187 lb 4.8 oz (84.959 kg)  Wt Readings from Last 3 Encounters:  11/01/14 187 lb 4.8 oz (84.959 kg)  10/31/14 186 lb 8 oz (84.596 kg)  10/30/14 187 lb 9.6 oz (85.095 kg)

## 2014-11-04 ENCOUNTER — Other Ambulatory Visit: Payer: Self-pay | Admitting: *Deleted

## 2014-11-04 ENCOUNTER — Institutional Professional Consult (permissible substitution) (INDEPENDENT_AMBULATORY_CARE_PROVIDER_SITE_OTHER): Payer: Commercial Managed Care - HMO | Admitting: Thoracic Surgery (Cardiothoracic Vascular Surgery)

## 2014-11-04 ENCOUNTER — Encounter: Payer: Self-pay | Admitting: Thoracic Surgery (Cardiothoracic Vascular Surgery)

## 2014-11-04 VITALS — BP 137/90 | HR 95 | Resp 20 | Ht 74.0 in | Wt 187.0 lb

## 2014-11-04 DIAGNOSIS — R590 Localized enlarged lymph nodes: Secondary | ICD-10-CM

## 2014-11-04 DIAGNOSIS — C7931 Secondary malignant neoplasm of brain: Secondary | ICD-10-CM | POA: Diagnosis not present

## 2014-11-04 DIAGNOSIS — R918 Other nonspecific abnormal finding of lung field: Secondary | ICD-10-CM

## 2014-11-04 NOTE — Progress Notes (Addendum)
PCP is Asencion Noble, MD Referring Provider is Javier Glazier, MD   Robynn Pane, PA Ancil Linsey, MD  Eppie Gibson, MD    Chief Complaint  Patient presents with  . Lung Mass    Surgical eval, PET Scan 10/21/14, Chest CT 10/10/14, MR Brain 10/23/14     HPI: 74 yo man sent for consultation re: lung mass/ adenopathy  Jared Gill is a 74 yo former smoker who recently has been found to have metastatic cancer. He recently began having double vision. He thought it was a problem with his glasses so he went to his opthamologist. He was referred back to Dr. Willey Blade. An MR of the brain revealed an isolated brain metastasis. Further workup included a chest CT which revealed a right hilar mass and mediastinal adenopathy. He was referred to Pulmonary. Dr. Ashok Cordia did a bronch and EBUS but it was nondiagnostic.   He has started radiation to the brain, but needs a tissue diagnosis to guide additional therapy.   Past Medical History  Diagnosis Date  . Hypertension   . Abnormal MRI, spine 05/12/2011  . Nausea 01/2014  . BPH (benign prostatic hypertrophy)   . Hx of adenomatous colonic polyps     h/o tubulovillous adenomas  . Diverticulitis   . Brain metastasis   . Metastasis to brain   . Lung mass     Right Upper Lobe  . GERD (gastroesophageal reflux disease)   . Blurred vision     Past Surgical History  Procedure Laterality Date  . Cholecystectomy  2009  . Polyp removal via colonoscopy    . Colonoscopy with esophagogastroduodenoscopy (egd) N/A 04/06/2013    TMA:UQJFHLKT size internal hemorrhoids/moderate diverticulosis/medium sized HH  . Tonsilectomy, adenoidectomy, bilateral myringotomy and tubes    . Video bronchoscopy with endobronchial ultrasound N/A 10/29/2014    Procedure: ATTEMPTED VIDEO BRONCHOSCOPY WITH ENDOBRONCHIAL ULTRASOUND;  Surgeon: Javier Glazier, MD;  Location: [facility] OR;  Service: Thoracic;  Laterality: N/A;    Family History  Problem Relation Age of Onset  . Diabetes Father    . Diabetes Sister   . Lung cancer Brother   . Colon cancer Neg Hx     Social History Social History  Substance Use Topics  . Smoking status: Former Smoker -- 0.50 packs/day for 40 years    Types: Cigarettes    Quit date: 10/17/2009  . Smokeless tobacco: Never Used  . Alcohol Use: No     Comment: Remote EtOH    Current Outpatient Prescriptions  Medication Sig Dispense Refill  . amLODipine (NORVASC) 5 MG tablet Take 5 mg by mouth daily.    . calcium carbonate (TUMS - DOSED IN MG ELEMENTAL CALCIUM) 500 MG chewable tablet Chew 1 tablet by mouth daily as needed for indigestion or heartburn.    . dexamethasone (DECADRON) 4 MG tablet Take 2 tablets (8 mg total) by mouth 2 (two) times daily. 180 tablet 1  . dicyclomine (BENTYL) 10 MG capsule Take 10 mg by mouth 2 (two) times daily.    [name] Kitchen doxazosin (CARDURA) 2 MG tablet Take 2 mg by mouth daily.    . fluconazole (DIFLUCAN) 100 MG tablet Take 2 tablets today, then 1 tablet daily x 20 more days. 22 tablet 0  . HYDROcodone-acetaminophen (NORCO/VICODIN) 5-325 MG per tablet Take 1 tablet by mouth every 6 (six) hours as needed. 20 tablet 0  . Linaclotide (LINZESS) 145 MCG CAPS capsule 1 PO WITH BREAKFAST 30 capsule 11  . LORazepam (ATIVAN) 1 MG tablet  Take 1 tablet (1 mg total) by mouth every 8 (eight) hours. 30 tablet 0  . mirtazapine (REMERON) 15 MG tablet Take 1 tablet (15 mg total) by mouth at bedtime. 90 tablet 3  . Multiple Vitamin (MULTIVITAMIN) capsule Take 1 capsule by mouth daily.    [name] Kitchen nystatin (MYCOSTATIN) 100000 UNIT/ML suspension Take 5 mLs (500,000 Units total) by mouth 3 (three) times daily. X 7 days (Patient not taking: Reported on 11/01/2014) 105 mL 0  . omeprazole (PRILOSEC) 20 MG capsule 1 PO 30 MINS PRIOR TO BREAKFAST AND AT BEDTIME 180 capsule 3  . ondansetron (ZOFRAN ODT) 4 MG disintegrating tablet Take 1 tablet (4 mg total) by mouth every 6 (six) hours as needed for nausea. 30 tablet 3  . ondansetron (ZOFRAN) 4 MG tablet     .  polyethylene glycol powder (GLYCOLAX/MIRALAX) powder Take 17 g by mouth daily as needed (for constipation).     [name] Kitchen tetrahydrozoline 0.05 % ophthalmic solution Place 1 drop into both eyes daily as needed (redness relief).     No current facility-administered medications for this visit.    Allergies  Allergen Reactions  . Reglan [Metoclopramide]     ANXIETY, AGITATION, JITTERY    Review of Systems  Constitutional: Positive for activity change and unexpected weight change (lost 4 pounds in 3 months). Negative for fever, chills and appetite change.  Eyes: Positive for visual disturbance (diplopia- right eye).  Respiratory: Positive for shortness of breath (with exertion). Negative for cough and wheezing.   Cardiovascular: Negative for chest pain and leg swelling.  Gastrointestinal: Positive for constipation. Negative for abdominal pain, diarrhea and blood in stool.  Genitourinary: Positive for frequency.  Musculoskeletal: Negative for back pain and arthralgias.  Neurological: Negative for dizziness, seizures, syncope and weakness.       Double vision  Hematological: Negative for adenopathy. Does not bruise/bleed easily.  All other systems reviewed and are negative.   BP 137/90 mmHg  Pulse 95  Resp 20  Ht '6\' 2"'$  (1.88 m)  Wt 187 lb (84.823 kg)  BMI 24.00 kg/m2  SpO2 97% Physical Exam  Constitutional: He is oriented to person, place, and time. He appears well-developed and well-nourished.  HENT:  Head: Normocephalic and atraumatic.  Mouth/Throat: No oropharyngeal exudate.  Eyes: Conjunctivae are normal. No scleral icterus.  Closes right eye to see  Neck: Normal range of motion. Neck supple. No thyromegaly present.  Cardiovascular: Normal rate, regular rhythm and normal heart sounds.  Exam reveals no friction rub.   No murmur heard. Pulmonary/Chest: Effort normal and breath sounds normal. He has no wheezes. He has no rales.  Abdominal: Soft. Bowel sounds are normal. He exhibits no  distension. There is no tenderness.  Musculoskeletal: He exhibits no edema or tenderness.  Lymphadenopathy:    He has no cervical adenopathy.  Neurological: He is alert and oriented to person, place, and time. A cranial nerve deficit is present. He exhibits normal muscle tone. Gait abnormal.  Skin: Skin is warm and dry.  Psychiatric: He has a normal mood and affect.  Vitals reviewed.    Diagnostic Tests: NUCLEAR MEDICINE PET SKULL BASE TO THIGH  TECHNIQUE: 10.3 mCi F-18 FDG was injected intravenously. Full-ring PET imaging was performed from the skull base to thigh after the radiotracer. CT data was obtained and used for attenuation correction and anatomic localization.  FASTING BLOOD GLUCOSE: Value: 108 mg/dl  COMPARISON: CT chest abdomen pelvis dated 10/10/2014.  FINDINGS: NECK  No hypermetabolic lymph nodes in the neck.  CHEST  3.3 x 2.8 cm medial right upper lobe mass (series 4/ image 53), max SUV 11.9.  Two central right upper lobe nodules measuring 1.3 and 2.0 cm (series 6/ image 23), max SUV 9.9.  Nodularity along the pleural fat with focal hypermetabolism overlying the right 10th-11th rib interspace, max SUV 6.5.  Associated thoracic lymphadenopathy, including:  --6 mm short axis right supraclavicular node (series 4/ image 47), max SUV 5.3  --13 mm short axis right paratracheal node (series 4/ image 66), max SUV 19.1  --12 mm short axis right hilar node (series 4/ image 36), max SUV 13.0  ABDOMEN/PELVIS  No abnormal hypermetabolic activity within the liver, pancreas, adrenal glands, or spleen.  Status post cholecystectomy. Atherosclerotic calcifications of the abdominal aorta and branch vessels.  No hypermetabolic lymph nodes in the abdomen or pelvis.  SKELETON  Focal hypermetabolism involving the anterior column of the right acetabulum, max SUV 5.7, although without CT correlate.  Focal hypermetabolism involving the left  anterior 4th rib, max SUV 5.8.  IMPRESSION: 3.3 cm medial right upper lobe mass with two central right upper lobe nodules measuring up to 2.0 cm, suspicious for primary bronchogenic neoplasm with a pancoast tumor.  Associated right supraclavicular, mediastinal, and right hilar nodal metastases.  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   Electronically Signed  By: Julian Hy M.D.  On: 10/21/2014 16:20   I personally reviewed the PET CT, CT chest and MR brain and concur with the radiologist's findings.  Impression: 73 yo former smoker with stage IV lung cancer who needs a tissue diagnosis to guide therapy. He had bronch/ EBUS which was nondiagnostic. Given the time concerns (he has already had to start treatment) and the need for adequate tissue for molecular testing, I think the best option at this point is mediastinoscopy.   I discussed the general nature of the procedure, the need for general anesthesia, and the incision to be used with Jared Gill, his brother and his nephew. They understand the general nature of the procedure and that we will plan to do it on an outpatient basis. I reviewed the indications, risks, benefits and alternatives. He understands the risks include, but are not limited to death, stroke, MI, DVT/PE, bleeding, possible need for transfusion, infections, recurrent nerve injury and esophageal injury. They understand this is a diagnostic, not a therapeutic procedure. He accepts the risks and agrees to proceed.  Plan: Mediastinoscopy on Thursday 11/07/2014  Melrose Nakayama, MD [facility] [phone]

## 2014-11-04 NOTE — Progress Notes (Signed)
  Radiation Oncology         [phone] ________________________________  Name: Jared Gill MRN: [id number]  Date: 11/01/2014  DOB: 06-Jun-1940  End of Treatment Note  Diagnosis: Ring enhancing lesion w/in midbrain/cerebral peduncle consistent with brain metastasis of lung primary  ICD-9-CM ICD-10-CM    1. Brain metastasis 198.3 C79.31        Indication for treatment:  palliative       Radiation treatment dates:   10/25/2014, 10/30/2014, 11/01/2014  Site/dose/Beams/energy :   Right Midbrain/cerebral peduncle 41m target was treated using 5 Dynamic Conformal Arcs with 6 MV FFF photons to a prescription dose of 7 Gy times 3 fractions to total 21Gy.     Narrative: The patient tolerated radiation treatment relatively well.     Plan: The patient has completed radiation treatment. The patient will return to radiation oncology clinic for routine followup in one half month. I advised them to call or return sooner if they have any questions or concerns related to their recovery or treatment.  -----------------------------------  SEppie Gibson MD

## 2014-11-06 ENCOUNTER — Encounter (HOSPITAL_COMMUNITY): Payer: Self-pay | Admitting: *Deleted

## 2014-11-06 NOTE — Progress Notes (Signed)
Spoke with patient's wife to inform her patient's surgery time has changed and he now needs to be here at 10am instead of 1045am.  She verbalized understanding.

## 2014-11-06 NOTE — Progress Notes (Signed)
Called pt for pre-op call and he handed the phone to his brother, Ashaun Gaughan. Mr. Buonocore was able to verify allergies, meds, medical and surgical history. Pre-op instructions given to pt's brother according to pre-op checklist.  Pt's brother states pt does not have any cardiac history and has not had any recent chest pain or sob.

## 2014-11-07 ENCOUNTER — Ambulatory Visit (HOSPITAL_COMMUNITY): Payer: Commercial Managed Care - HMO | Admitting: Anesthesiology

## 2014-11-07 ENCOUNTER — Observation Stay (HOSPITAL_COMMUNITY)
Admission: RE | Admit: 2014-11-07 | Discharge: 2014-11-08 | Disposition: A | Discharge status: Home / Self Care | Payer: Commercial Managed Care - HMO | Source: Ambulatory Visit | Attending: Thoracic Surgery (Cardiothoracic Vascular Surgery) | Admitting: Thoracic Surgery (Cardiothoracic Vascular Surgery)

## 2014-11-07 ENCOUNTER — Encounter (HOSPITAL_COMMUNITY)
Admission: RE | Disposition: A | Discharge status: Home / Self Care | Payer: Self-pay | Source: Ambulatory Visit | Attending: Thoracic Surgery (Cardiothoracic Vascular Surgery)

## 2014-11-07 ENCOUNTER — Ambulatory Visit (HOSPITAL_COMMUNITY): Payer: Commercial Managed Care - HMO

## 2014-11-07 ENCOUNTER — Encounter (HOSPITAL_COMMUNITY): Payer: Self-pay | Admitting: Surgery

## 2014-11-07 DIAGNOSIS — I4891 Unspecified atrial fibrillation: Secondary | ICD-10-CM | POA: Diagnosis not present

## 2014-11-07 DIAGNOSIS — C3490 Malignant neoplasm of unspecified part of unspecified bronchus or lung: Secondary | ICD-10-CM | POA: Diagnosis not present

## 2014-11-07 DIAGNOSIS — C7931 Secondary malignant neoplasm of brain: Secondary | ICD-10-CM | POA: Diagnosis not present

## 2014-11-07 DIAGNOSIS — R590 Localized enlarged lymph nodes: Secondary | ICD-10-CM | POA: Diagnosis present

## 2014-11-07 DIAGNOSIS — I4892 Unspecified atrial flutter: Secondary | ICD-10-CM | POA: Diagnosis not present

## 2014-11-07 DIAGNOSIS — Z801 Family history of malignant neoplasm of trachea, bronchus and lung: Secondary | ICD-10-CM | POA: Insufficient documentation

## 2014-11-07 DIAGNOSIS — Z86010 Personal history of colonic polyps: Secondary | ICD-10-CM | POA: Insufficient documentation

## 2014-11-07 DIAGNOSIS — Z87891 Personal history of nicotine dependence: Secondary | ICD-10-CM | POA: Diagnosis not present

## 2014-11-07 DIAGNOSIS — C771 Secondary and unspecified malignant neoplasm of intrathoracic lymph nodes: Secondary | ICD-10-CM | POA: Insufficient documentation

## 2014-11-07 DIAGNOSIS — I9789 Other postprocedural complications and disorders of the circulatory system, not elsewhere classified: Secondary | ICD-10-CM | POA: Insufficient documentation

## 2014-11-07 DIAGNOSIS — C7951 Secondary malignant neoplasm of bone: Secondary | ICD-10-CM | POA: Insufficient documentation

## 2014-11-07 DIAGNOSIS — K219 Gastro-esophageal reflux disease without esophagitis: Secondary | ICD-10-CM | POA: Diagnosis not present

## 2014-11-07 DIAGNOSIS — K5730 Diverticulosis of large intestine without perforation or abscess without bleeding: Secondary | ICD-10-CM | POA: Insufficient documentation

## 2014-11-07 DIAGNOSIS — Z8719 Personal history of other diseases of the digestive system: Secondary | ICD-10-CM | POA: Diagnosis not present

## 2014-11-07 DIAGNOSIS — Z85118 Personal history of other malignant neoplasm of bronchus and lung: Secondary | ICD-10-CM | POA: Diagnosis not present

## 2014-11-07 DIAGNOSIS — I10 Essential (primary) hypertension: Secondary | ICD-10-CM | POA: Diagnosis not present

## 2014-11-07 DIAGNOSIS — Z800 Family history of malignant neoplasm of digestive organs: Secondary | ICD-10-CM | POA: Diagnosis not present

## 2014-11-07 DIAGNOSIS — Z7982 Long term (current) use of aspirin: Secondary | ICD-10-CM | POA: Diagnosis not present

## 2014-11-07 DIAGNOSIS — I700 Atherosclerosis of aorta: Secondary | ICD-10-CM | POA: Diagnosis not present

## 2014-11-07 DIAGNOSIS — Z888 Allergy status to other drugs, medicaments and biological substances status: Secondary | ICD-10-CM | POA: Insufficient documentation

## 2014-11-07 DIAGNOSIS — N400 Enlarged prostate without lower urinary tract symptoms: Secondary | ICD-10-CM | POA: Diagnosis not present

## 2014-11-07 DIAGNOSIS — C383 Malignant neoplasm of mediastinum, part unspecified: Secondary | ICD-10-CM | POA: Diagnosis not present

## 2014-11-07 DIAGNOSIS — R918 Other nonspecific abnormal finding of lung field: Secondary | ICD-10-CM

## 2014-11-07 HISTORY — PX: MEDIASTINOSCOPY: SHX5086

## 2014-11-07 LAB — PROTIME-INR
INR: 1.42 (ref 0.00–1.49)
Prothrombin Time: 17.4 s — ABNORMAL HIGH (ref 11.6–15.2)

## 2014-11-07 LAB — CBC
HCT: 41.9 % (ref 39.0–52.0)
Hemoglobin: 14.4 g/dL (ref 13.0–17.0)
MCH: 24.2 pg — ABNORMAL LOW (ref 26.0–34.0)
MCHC: 34.4 g/dL (ref 30.0–36.0)
MCV: 70.4 fL — ABNORMAL LOW (ref 78.0–100.0)
Platelets: 80 K/uL — ABNORMAL LOW (ref 150–400)
RBC: 5.95 MIL/uL — ABNORMAL HIGH (ref 4.22–5.81)
RDW: 17 % — ABNORMAL HIGH (ref 11.5–15.5)
WBC: 9.9 K/uL (ref 4.0–10.5)

## 2014-11-07 LAB — COMPREHENSIVE METABOLIC PANEL WITH GFR
ALT: 35 U/L (ref 17–63)
AST: 21 U/L (ref 15–41)
Albumin: 2.8 g/dL — ABNORMAL LOW (ref 3.5–5.0)
Alkaline Phosphatase: 36 U/L — ABNORMAL LOW (ref 38–126)
Anion gap: 7 (ref 5–15)
BUN: 25 mg/dL — ABNORMAL HIGH (ref 6–20)
CO2: 24 mmol/L (ref 22–32)
Calcium: 8.3 mg/dL — ABNORMAL LOW (ref 8.9–10.3)
Chloride: 102 mmol/L (ref 101–111)
Creatinine, Ser: 1.18 mg/dL (ref 0.61–1.24)
GFR calc Af Amer: >60 mL/min (ref >60)
GFR calc non Af Amer: 59 mL/min — ABNORMAL LOW (ref >60)
Glucose, Bld: 120 mg/dL — ABNORMAL HIGH (ref 65–99)
Potassium: 5 mmol/L (ref 3.5–5.1)
Sodium: 133 mmol/L — ABNORMAL LOW (ref 135–145)
Total Bilirubin: 0.9 mg/dL (ref 0.3–1.2)
Total Protein: 4.6 g/dL — ABNORMAL LOW (ref 6.5–8.1)

## 2014-11-07 LAB — APTT: aPTT: 25 seconds (ref 24–37)

## 2014-11-07 LAB — SURGICAL PCR SCREEN
MRSA, PCR: NEGATIVE
STAPHYLOCOCCUS AUREUS: POSITIVE — AB

## 2014-11-07 LAB — TYPE AND SCREEN
ABO/RH(D): O POS
Antibody Screen: NEGATIVE

## 2014-11-07 LAB — TROPONIN I: Troponin I: <0.03 ng/mL (ref <0.031)

## 2014-11-07 LAB — ABO/RH: ABO/RH(D): O POS

## 2014-11-07 SURGERY — MEDIASTINOSCOPY
Anesthesia: General

## 2014-11-07 MED ORDER — LINACLOTIDE 145 MCG PO CAPS
145.0000 ug | ORAL_CAPSULE | Freq: Every day | ORAL | Status: DC
  Administered 2014-11-08: 145 ug via ORAL
  Filled 2014-11-07: qty 1

## 2014-11-07 MED ORDER — FENTANYL CITRATE (PF) 100 MCG/2ML IJ SOLN: 25.0000 ug | INTRAMUSCULAR | Status: DC | PRN

## 2014-11-07 MED ORDER — MIDAZOLAM HCL 2 MG/2ML IJ SOLN
INTRAMUSCULAR | Status: AC
  Filled 2014-11-07: qty 4

## 2014-11-07 MED ORDER — ONDANSETRON HCL 4 MG/2ML IJ SOLN: 4.0000 mg | Freq: Four times a day (QID) | INTRAMUSCULAR | Status: DC | PRN

## 2014-11-07 MED ORDER — SODIUM CHLORIDE 0.9 % IV SOLN: 250.0000 mL | INTRAVENOUS | Status: DC | PRN

## 2014-11-07 MED ORDER — AMLODIPINE BESYLATE 5 MG PO TABS
5.0000 mg | ORAL_TABLET | Freq: Every day | ORAL | Status: DC
  Administered 2014-11-08: 5 mg via ORAL
  Filled 2014-11-07: qty 1

## 2014-11-07 MED ORDER — MIRTAZAPINE 15 MG PO TABS
15.0000 mg | ORAL_TABLET | Freq: Every day | ORAL | Status: DC
  Administered 2014-11-07: 15 mg via ORAL
  Filled 2014-11-07 (×3): qty 1

## 2014-11-07 MED ORDER — 0.9 % SODIUM CHLORIDE (POUR BTL) OPTIME
TOPICAL | Status: DC | PRN
  Administered 2014-11-07: 1000 mL

## 2014-11-07 MED ORDER — LIDOCAINE HCL (CARDIAC) 20 MG/ML IV SOLN
INTRAVENOUS | Status: DC | PRN
  Administered 2014-11-07: 80 mg via INTRAVENOUS

## 2014-11-07 MED ORDER — ONDANSETRON HCL 4 MG/2ML IJ SOLN: 4.0000 mg | Freq: Once | INTRAMUSCULAR | Status: DC | PRN

## 2014-11-07 MED ORDER — METOPROLOL TARTRATE 25 MG PO TABS
25.0000 mg | ORAL_TABLET | Freq: Two times a day (BID) | ORAL | Status: DC
  Administered 2014-11-07 – 2014-11-08 (×2): 25 mg via ORAL
  Filled 2014-11-07 (×2): qty 1

## 2014-11-07 MED ORDER — ROCURONIUM BROMIDE 100 MG/10ML IV SOLN
INTRAVENOUS | Status: DC | PRN
  Administered 2014-11-07: 10 mg via INTRAVENOUS
  Administered 2014-11-07: 40 mg via INTRAVENOUS

## 2014-11-07 MED ORDER — DOXAZOSIN MESYLATE 2 MG PO TABS
2.0000 mg | ORAL_TABLET | Freq: Every day | ORAL | Status: DC
  Administered 2014-11-08: 2 mg via ORAL
  Filled 2014-11-07 (×2): qty 1

## 2014-11-07 MED ORDER — PANTOPRAZOLE SODIUM 40 MG PO TBEC
40.0000 mg | DELAYED_RELEASE_TABLET | Freq: Two times a day (BID) | ORAL | Status: DC
  Administered 2014-11-07 – 2014-11-08 (×2): 40 mg via ORAL
  Filled 2014-11-07 (×2): qty 1

## 2014-11-07 MED ORDER — GLYCOPYRROLATE 0.2 MG/ML IJ SOLN
INTRAMUSCULAR | Status: AC
  Filled 2014-11-07: qty 2

## 2014-11-07 MED ORDER — LORAZEPAM 1 MG PO TABS
1.0000 mg | ORAL_TABLET | Freq: Three times a day (TID) | ORAL | Status: DC
  Administered 2014-11-07 – 2014-11-08 (×4): 1 mg via ORAL
  Filled 2014-11-07 (×4): qty 1

## 2014-11-07 MED ORDER — LACTATED RINGERS IV SOLN
INTRAVENOUS | Status: DC
  Administered 2014-11-07: 12:00:00 via INTRAVENOUS

## 2014-11-07 MED ORDER — GLYCOPYRROLATE 0.2 MG/ML IJ SOLN
INTRAMUSCULAR | Status: DC | PRN
  Administered 2014-11-07: 0.4 mg via INTRAVENOUS

## 2014-11-07 MED ORDER — FENTANYL CITRATE (PF) 100 MCG/2ML IJ SOLN
INTRAMUSCULAR | Status: DC
Start: 2014-11-07 — End: 2014-11-07
  Filled 2014-11-07: qty 2

## 2014-11-07 MED ORDER — ACETAMINOPHEN 650 MG RE SUPP: 650.0000 mg | Freq: Four times a day (QID) | RECTAL | Status: DC | PRN

## 2014-11-07 MED ORDER — ONDANSETRON HCL 4 MG/2ML IJ SOLN
INTRAMUSCULAR | Status: DC | PRN
  Administered 2014-11-07: 4 mg via INTRAVENOUS

## 2014-11-07 MED ORDER — TRAMADOL HCL 50 MG PO TABS: 50.0000 mg | ORAL_TABLET | Freq: Four times a day (QID) | ORAL | Status: DC | PRN

## 2014-11-07 MED ORDER — ACETAMINOPHEN 160 MG/5ML PO SUSP
ORAL | Status: AC
  Administered 2014-11-07: 640 mg
  Filled 2014-11-07: qty 20

## 2014-11-07 MED ORDER — PROPOFOL 10 MG/ML IV BOLUS
INTRAVENOUS | Status: DC | PRN
  Administered 2014-11-07 (×2): 50 mg via INTRAVENOUS

## 2014-11-07 MED ORDER — DEXAMETHASONE 4 MG PO TABS
8.0000 mg | ORAL_TABLET | Freq: Two times a day (BID) | ORAL | Status: DC
  Administered 2014-11-07 – 2014-11-08 (×2): 8 mg via ORAL
  Filled 2014-11-07 (×2): qty 2

## 2014-11-07 MED ORDER — DEXTROSE 5 % IV SOLN
1.5000 g | INTRAVENOUS | Status: AC
  Administered 2014-11-07: 1.5 g via INTRAVENOUS
  Filled 2014-11-07: qty 1.5

## 2014-11-07 MED ORDER — SODIUM CHLORIDE 0.9 % IJ SOLN: 3.0000 mL | INTRAMUSCULAR | Status: DC | PRN

## 2014-11-07 MED ORDER — ONDANSETRON HCL 4 MG PO TABS: 4.0000 mg | ORAL_TABLET | Freq: Four times a day (QID) | ORAL | Status: DC | PRN

## 2014-11-07 MED ORDER — SENNOSIDES-DOCUSATE SODIUM 8.6-50 MG PO TABS: 1.0000 | ORAL_TABLET | Freq: Every evening | ORAL | Status: DC | PRN

## 2014-11-07 MED ORDER — MUPIROCIN 2 % EX OINT
1.0000 "application " | TOPICAL_OINTMENT | Freq: Once | CUTANEOUS | Status: AC
  Administered 2014-11-07: 1 via TOPICAL
  Filled 2014-11-07: qty 22

## 2014-11-07 MED ORDER — FLUCONAZOLE 100 MG PO TABS
100.0000 mg | ORAL_TABLET | Freq: Every day | ORAL | Status: DC
  Administered 2014-11-07 – 2014-11-08 (×2): 100 mg via ORAL
  Filled 2014-11-07 (×2): qty 1

## 2014-11-07 MED ORDER — SODIUM CHLORIDE 0.9 % IJ SOLN
3.0000 mL | Freq: Two times a day (BID) | INTRAMUSCULAR | Status: DC
  Administered 2014-11-07 – 2014-11-08 (×2): 3 mL via INTRAVENOUS

## 2014-11-07 MED ORDER — FENTANYL CITRATE (PF) 100 MCG/2ML IJ SOLN
INTRAMUSCULAR | Status: DC | PRN
  Administered 2014-11-07: 50 ug via INTRAVENOUS
  Administered 2014-11-07: 100 ug via INTRAVENOUS

## 2014-11-07 MED ORDER — NAPHAZOLINE-PHENIRAMINE 0.025-0.3 % OP SOLN
1.0000 [drp] | Freq: Every day | OPHTHALMIC | Status: DC
  Administered 2014-11-07 – 2014-11-08 (×2): 1 [drp] via OPHTHALMIC
  Filled 2014-11-07 (×9): qty 5

## 2014-11-07 MED ORDER — NAPHAZOLINE HCL 0.1 % OP SOLN
1.0000 [drp] | Freq: Every day | OPHTHALMIC | Status: DC
  Filled 2014-11-07: qty 15

## 2014-11-07 MED ORDER — CHLORHEXIDINE GLUCONATE CLOTH 2 % EX PADS: 6.0000 | MEDICATED_PAD | Freq: Once | CUTANEOUS | Status: DC

## 2014-11-07 MED ORDER — NEOSTIGMINE METHYLSULFATE 10 MG/10ML IV SOLN
INTRAVENOUS | Status: DC | PRN
  Administered 2014-11-07: 3 mg via INTRAVENOUS

## 2014-11-07 MED ORDER — PHENYLEPHRINE HCL 10 MG/ML IJ SOLN
10.0000 mg | INTRAMUSCULAR | Status: DC | PRN
  Administered 2014-11-07: 25 ug/min via INTRAVENOUS

## 2014-11-07 MED ORDER — DICYCLOMINE HCL 10 MG PO CAPS
10.0000 mg | ORAL_CAPSULE | Freq: Two times a day (BID) | ORAL | Status: DC
  Administered 2014-11-07 – 2014-11-08 (×2): 10 mg via ORAL
  Filled 2014-11-07 (×4): qty 1

## 2014-11-07 MED ORDER — TRAMADOL HCL 50 MG PO TABS
50.0000 mg | ORAL_TABLET | Freq: Four times a day (QID) | ORAL | Status: DC | PRN
  Administered 2014-11-08: 50 mg via ORAL
  Filled 2014-11-07: qty 1

## 2014-11-07 MED ORDER — ACETAMINOPHEN 325 MG PO TABS: 650.0000 mg | ORAL_TABLET | Freq: Four times a day (QID) | ORAL | Status: DC | PRN

## 2014-11-07 MED ORDER — POLYETHYLENE GLYCOL 3350 17 GM/SCOOP PO POWD
17.0000 g | Freq: Every day | ORAL | Status: DC | PRN
Start: 2014-11-08 — End: 2014-11-08

## 2014-11-07 MED ORDER — PROPOFOL 10 MG/ML IV BOLUS
INTRAVENOUS | Status: AC
  Filled 2014-11-07: qty 20

## 2014-11-07 MED ORDER — FENTANYL CITRATE (PF) 250 MCG/5ML IJ SOLN
INTRAMUSCULAR | Status: AC
  Filled 2014-11-07: qty 5

## 2014-11-07 MED ORDER — PHENYLEPHRINE HCL 10 MG/ML IJ SOLN
INTRAMUSCULAR | Status: DC | PRN
  Administered 2014-11-07: 120 ug via INTRAVENOUS
  Administered 2014-11-07: 80 ug via INTRAVENOUS
  Administered 2014-11-07: 120 ug via INTRAVENOUS

## 2014-11-07 SURGICAL SUPPLY — 45 items
APPLIER CLIP LOGIC TI 5 (MISCELLANEOUS) IMPLANT
BLADE SURG 15 STRL LF DISP TIS (BLADE) ×1 IMPLANT
BLADE SURG 15 STRL SS (BLADE) ×2
CANISTER SUCTION 2500CC (MISCELLANEOUS) ×3 IMPLANT
CLIP TI MEDIUM 6 (CLIP) IMPLANT
CONT SPEC 4OZ CLIKSEAL STRL BL (MISCELLANEOUS) ×6 IMPLANT
CONT SPECI 4OZ STER CLIK (MISCELLANEOUS) ×9 IMPLANT
COVER SURGICAL LIGHT HANDLE (MISCELLANEOUS) ×6 IMPLANT
DERMABOND ADVANCED (GAUZE/BANDAGES/DRESSINGS) ×2
DERMABOND ADVANCED .7 DNX12 (GAUZE/BANDAGES/DRESSINGS) ×1 IMPLANT
DRAPE CHEST BREAST 15X10 FENES (DRAPES) ×3 IMPLANT
ELECT REM PT RETURN 9FT ADLT (ELECTROSURGICAL) ×3
ELECTRODE REM PT RTRN 9FT ADLT (ELECTROSURGICAL) ×1 IMPLANT
GAUZE SPONGE 4X4 12PLY STRL (GAUZE/BANDAGES/DRESSINGS) ×3 IMPLANT
GAUZE SPONGE 4X4 16PLY XRAY LF (GAUZE/BANDAGES/DRESSINGS) ×6 IMPLANT
GLOVE SURG SIGNA 7.5 PF LTX (GLOVE) ×3 IMPLANT
GOWN STRL REUS W/ TWL LRG LVL3 (GOWN DISPOSABLE) ×1 IMPLANT
GOWN STRL REUS W/ TWL XL LVL3 (GOWN DISPOSABLE) ×1 IMPLANT
GOWN STRL REUS W/TWL LRG LVL3 (GOWN DISPOSABLE) ×2
GOWN STRL REUS W/TWL XL LVL3 (GOWN DISPOSABLE) ×2
HEMOSTAT SURGICEL 2X14 (HEMOSTASIS) IMPLANT
KIT BASIN OR (CUSTOM PROCEDURE TRAY) ×3 IMPLANT
KIT ROOM TURNOVER OR (KITS) ×3 IMPLANT
NS IRRIG 1000ML POUR BTL (IV SOLUTION) ×3 IMPLANT
PACK SURGICAL SETUP 50X90 (CUSTOM PROCEDURE TRAY) ×3 IMPLANT
PAD ARMBOARD 7.5X6 YLW CONV (MISCELLANEOUS) ×6 IMPLANT
PENCIL BUTTON HOLSTER BLD 10FT (ELECTRODE) ×3 IMPLANT
SPONGE INTESTINAL PEANUT (DISPOSABLE) ×3 IMPLANT
SUT ETHILON 3 0 PS 1 (SUTURE) IMPLANT
SUT SILK 2 0 (SUTURE)
SUT SILK 2-0 18XBRD TIE 12 (SUTURE) IMPLANT
SUT VIC AB 2-0 CT1 27 (SUTURE)
SUT VIC AB 2-0 CT1 TAPERPNT 27 (SUTURE) IMPLANT
SUT VIC AB 3-0 SH 18 (SUTURE) IMPLANT
SUT VIC AB 3-0 SH 27 (SUTURE) ×2
SUT VIC AB 3-0 SH 27X BRD (SUTURE) ×1 IMPLANT
SUT VICRYL 4-0 PS2 18IN ABS (SUTURE) ×3 IMPLANT
SWAB COLLECTION DEVICE MRSA (MISCELLANEOUS) IMPLANT
SYRINGE 10CC LL (SYRINGE) ×6 IMPLANT
TOWEL OR 17X24 6PK STRL BLUE (TOWEL DISPOSABLE) ×3 IMPLANT
TOWEL OR 17X26 10 PK STRL BLUE (TOWEL DISPOSABLE) ×3 IMPLANT
TUBE ANAEROBIC SPECIMEN COL (MISCELLANEOUS) IMPLANT
TUBE CONNECTING 12'X1/4 (SUCTIONS) ×1
TUBE CONNECTING 12X1/4 (SUCTIONS) ×2 IMPLANT
WATER STERILE IRR 1000ML POUR (IV SOLUTION) ×3 IMPLANT

## 2014-11-07 NOTE — Brief Op Note (Signed)
11/07/2014  1:13 PM  PATIENT:  Jared Gill  74 y.o. male  PRE-OPERATIVE DIAGNOSIS:  RIGHT LUNG MASS/ MEDIASTINAL ADENOPATHY  POST-OPERATIVE DIAGNOSIS:  Stage IV non-small carcinoma   PROCEDURE:  Procedure(s): MEDIASTINOSCOPY (N/A)  SURGEON:  Surgeon(s) and Role:    * Melrose Nakayama, MD - Primary  PHYSICIAN ASSISTANT:   ASSISTANTS: none   ANESTHESIA:   general  EBL:     BLOOD ADMINISTERED:none  DRAINS: none   LOCAL MEDICATIONS USED:  NONE  SPECIMEN:  Source of Specimen:  4R and 7 lymph nodes  DISPOSITION OF SPECIMEN:  PATHOLOGY  COUNTS:  YES  PLAN OF CARE: Discharge to home after PACU  PATIENT DISPOSITION:  PACU - hemodynamically stable.   Delay start of Pharmacological VTE agent (>24hrs) due to surgical blood loss or risk of bleeding: not applicable  Frozen showed Non-small cell carcinoma

## 2014-11-07 NOTE — Anesthesia Procedure Notes (Signed)
Procedure Name: Intubation Date/Time: 11/07/2014 12:03 PM Performed by: Rush Farmer E Pre-anesthesia Checklist: Patient identified, Emergency Drugs available, Suction available, Patient being monitored and Timeout performed Patient Re-evaluated:Patient Re-evaluated prior to inductionOxygen Delivery Method: Circle system utilized Preoxygenation: Pre-oxygenation with 100% oxygen Intubation Type: IV induction Ventilation: Mask ventilation without difficulty and Oral airway inserted - appropriate to patient size Laryngoscope Size: Mac and 4 Grade View: Grade I Tube type: Oral Tube size: 7.5 mm Number of attempts: 1 Airway Equipment and Method: Stylet Placement Confirmation: ETT inserted through vocal cords under direct vision,  positive ETCO2 and breath sounds checked- equal and bilateral Secured at: 22 cm Tube secured with: Tape Dental Injury: Teeth and Oropharynx as per pre-operative assessment

## 2014-11-07 NOTE — Progress Notes (Signed)
Pt transferred from PACU at 1830 to stepdown unit-- 2W27. Vital signs stable. Pt noted to be in a-fib rate in the 100 to 120s. His oxygen saturation on room air is in the upper 90s. Pt was oriented to room and use of call light and reminded to call for all needs. He denied any pain on admission. Initiated plan of care for dysrhythmia.

## 2014-11-07 NOTE — Anesthesia Preprocedure Evaluation (Addendum)
Anesthesia Evaluation  Patient identified by MRN, date of birth, ID band Patient awake    Reviewed: Allergy & Precautions, NPO status , Patient's Chart, lab work & pertinent test results  Airway Mallampati: II  TM Distance: >3 FB Neck ROM: Full    Dental  (+) Dental Advisory Given, Edentulous Upper, Edentulous Lower   Pulmonary neg COPD, former smoker,    Pulmonary exam normal breath sounds clear to auscultation       Cardiovascular Exercise Tolerance: Poor hypertension, Pt. on medications (-) angina(-) Past MI Normal cardiovascular exam Rhythm:Regular Rate:Normal     Neuro/Psych PSYCHIATRIC DISORDERS Anxiety Brain cancer    GI/Hepatic Neg liver ROS, GERD  Medicated,  Endo/Other  negative endocrine ROS  Renal/GU negative Renal ROS     Musculoskeletal negative musculoskeletal ROS (+)   Abdominal   Peds  Hematology negative hematology ROS (+)   Anesthesia Other Findings Day of surgery medications reviewed with the patient.  Reproductive/Obstetrics                            Anesthesia Physical Anesthesia Plan  ASA: III  Anesthesia Plan: General   Post-op Pain Management:    Induction: Intravenous  Airway Management Planned: Oral ETT  Additional Equipment:   Intra-op Plan:   Post-operative Plan: Extubation in OR and Possible Post-op intubation/ventilation  Informed Consent: I have reviewed the patients History and Physical, chart, labs and discussed the procedure including the risks, benefits and alternatives for the proposed anesthesia with the patient or authorized representative who has indicated his/her understanding and acceptance.   Dental advisory given  Plan Discussed with: CRNA  Anesthesia Plan Comments: (Risks/benefits of general anesthesia discussed with patient including risk of damage to teeth, lips, gum, and tongue, nausea/vomiting, allergic reactions to  medications, and the possibility of heart attack, stroke and death.  All patient questions answered.  Patient wishes to proceed.)        Anesthesia Quick Evaluation

## 2014-11-07 NOTE — Progress Notes (Signed)
      [place]       , [id number]             [phone]      Developed new onset atrial fib/ flutter in PACU  BP 130/96 mmHg  Pulse 92  Temp(Src) 97.2 F (36.2 C) (Oral)  Resp 17  Wt 187 lb (84.823 kg)  SpO2 100%  BP is Ok and rate is controlled  I will consult Cardiology to see how they think we should treat this.  Revonda Standard Roxan Hockey, MD [facility] [phone]

## 2014-11-07 NOTE — Progress Notes (Signed)
EKG change since last noted in NSR. Possible AF vs AF. Dr. Gifford Shave made aware . 12 lead EKG ordered

## 2014-11-07 NOTE — Interval H&P Note (Signed)
History and Physical Interval Note:  11/07/2014 11:40 AM  Jared Gill  has presented today for surgery, with the diagnosis of RIGHT LUNG MASS MEDIASTINAL ADENOPATHY  The various methods of treatment have been discussed with the patient and family. After consideration of risks, benefits and other options for treatment, the patient has consented to  Procedure(s): MEDIASTINOSCOPY (N/A) as a surgical intervention .  The patient's history has been reviewed, patient examined, no change in status, stable for surgery.  I have reviewed the patient's chart and labs.  Questions were answered to the patient's satisfaction.     Melrose Nakayama

## 2014-11-07 NOTE — Anesthesia Postprocedure Evaluation (Signed)
  Anesthesia Post-op Note  Patient: Jared Gill  Procedure(s) Performed: Procedure(s): MEDIASTINOSCOPY (N/A)  Patient Location: PACU  Anesthesia Type:General  Level of Consciousness: awake, alert , oriented and patient cooperative  Airway and Oxygen Therapy: Patient connected to nasal cannula oxygen  Post-op Pain: mild  Post-op Assessment: Post-op Vital signs reviewed, Patient's Cardiovascular Status Stable, Respiratory Function Stable, Patent Airway, No signs of Nausea or vomiting and Pain level controlled              Post-op Vital Signs: Reviewed and stable  Last Vitals:  Filed Vitals:   11/07/14 1500  BP:   Pulse: 93  Temp:   Resp: 15    Complications: Found to be in A-fib on post-op EKG.  Rate controlled.  Cardiology consulted for new onset A-fib. Patient denies CP, SOB.

## 2014-11-07 NOTE — Discharge Instructions (Addendum)
Do not drive or engage in heavy physical activity for 24 hours  You may shower and resume normal activities tomorrow  You have a prescription for pain medication, tramadol, use as directed  Call [phone] if you develop chest pain, shortness of breath, fever > 101F, or notice redness around or drainage from the incision  My office will contact you with follow up information  Mediastinoscopy, Care After Refer to this sheet in the next few weeks. These instructions provide you with information on caring for yourself after your procedure. Your health care provider may also give you more specific instructions. Your treatment has been planned according to current medical practices, but problems sometimes occur. Call your health care provider if you have any problems or questions after your procedure. WHAT TO EXPECT AFTER THE PROCEDURE After your procedure, it is typical to have the following:  Slight pain in the incision area.  Sore throat. HOME CARE INSTRUCTIONS   Take all medicines as directed by your health care provider.  Use showers for bathing. Do not bathe, swim, or use a hot tub until directed by your health care provider.  Remove your bandages (dressings) after 48 hours unless your health care provider tells you otherwise.  You may resume your normal diet.  Limit your activities as directed by your health care provider. Avoid lifting or driving until your health care provider approves.  Keep all follow-up visits as directed by your health care provider.  Make sure you know how to get any test results. SEEK MEDICAL CARE IF:  You have increased bleeding from the incision site.  You have drainage, redness, swelling, or pain at your incision site.  You have a fever.  You notice a bad smell coming from the incision or bandages. SEEK IMMEDIATE MEDICAL CARE IF:   You have a rash.  You have difficulty breathing.  You have chest pain or shortness of breath. Document  Released: 08/28/2004 Document Revised: 06/25/2013 Document Reviewed: 10/04/2012 [facility] Patient Information 2015 [place], [place]. This information is not intended to replace advice given to you by your health care provider. Make sure you discuss any questions you have with your health care provider.  Atrial Fibrillation Atrial fibrillation is a condition that causes your heart to beat irregularly. It may also cause your heart to beat faster than normal. Atrial fibrillation can prevent your heart from pumping blood normally. It increases your risk of stroke and heart problems. HOME CARE  Take medications as told by your doctor.  Only take medications that your doctor says are safe. Some medications can make the condition worse or happen again.  If blood thinners were prescribed by your doctor, take them exactly as told. Too much can cause bleeding. Too little and you will not have the needed protection against stroke and other problems.  Perform blood tests at home if told by your doctor.  Perform blood tests exactly as told by your doctor.  Do not drink alcohol.  Do not drink beverages with caffeine such as coffee, soda, and some teas.  Maintain a healthy weight.  Do not use diet pills unless your doctor says they are safe. They may make heart problems worse.  Follow diet instructions as told by your doctor.  Exercise regularly as told by your doctor.  Keep all follow-up appointments. GET HELP IF:  You notice a change in the speed, rhythm, or strength of your heartbeat.  You suddenly begin peeing (urinating) more often.  You get tired more easily  when moving or exercising. GET HELP RIGHT AWAY IF:   You have chest or belly (abdominal) pain.  You feel sick to your stomach (nauseous).  You are short of breath.  You suddenly have swollen feet and ankles.  You feel dizzy.  You face, arms, or legs feel numb or weak.  There is a change in your vision or speech. MAKE SURE  YOU:   Understand these instructions.  Will watch your condition.  Will get help right away if you are not doing well or get worse. Document Released: 11/18/2007 Document Revised: 06/25/2013 Document Reviewed: 03/21/2012 [facility] Patient Information 2015 [place], [place]. This information is not intended to replace advice given to you by your health care provider. Make sure you discuss any questions you have with your health care provider.

## 2014-11-07 NOTE — Consult Note (Signed)
Reason for Consult: a fib/flutter   Referring Physician: Dr. Roxan Hockey   PCP:  Asencion Noble, MD  Primary Cardiologist:new  Jared Gill is an 74 y.o. male.    Chief Complaint: new a fib   HPI: 74 yr old male, being treated for metastatic cancer, with lung mass and on MR of the brain with isolated brain metastasis.  Chest CT which revealed a right hilar mass and mediastinal adenopathy. He was referred to Pulmonary. Dr. Ashok Cordia did a bronch and EBUS but it was nondiagnostic,    Today pt had biopsy revealing non-small cell carcinoma. In PACU pt developed a fib with controlled rate. No chest pain no SOB.   Cardiac History: none, no stress, cath or echo.  No chest pain or SOB.  TSH in Dec was 1.2. EKG with nonspecific ST abnormality- a fib.   Past Medical History  Diagnosis Date  . Hypertension   . Abnormal MRI, spine 05/12/2011  . Nausea 01/2014  . BPH (benign prostatic hypertrophy)   . Hx of adenomatous colonic polyps     h/o tubulovillous adenomas  . Diverticulitis   . Brain metastasis   . Metastasis to brain   . Lung mass     Right Upper Lobe  . GERD (gastroesophageal reflux disease)   . Blurred vision     Past Surgical History  Procedure Laterality Date  . Cholecystectomy  2009  . Polyp removal via colonoscopy    . Colonoscopy with esophagogastroduodenoscopy (egd) N/A 04/06/2013    ZGY:[id number] size internal hemorrhoids/moderate diverticulosis/medium sized HH  . Tonsilectomy, adenoidectomy, bilateral myringotomy and tubes    . Video bronchoscopy with endobronchial ultrasound N/A 10/29/2014    Procedure: ATTEMPTED VIDEO BRONCHOSCOPY WITH ENDOBRONCHIAL ULTRASOUND;  Surgeon: Javier Glazier, MD;  Location: [facility] OR;  Service: Thoracic;  Laterality: N/A;    Family History  Problem Relation Age of Onset  . Diabetes Father   . Diabetes Sister   . Lung cancer Brother   . CVA Brother   . Colon cancer Neg Hx   . CAD Brother    Social History:  reports that he  quit smoking about 5 years ago. His smoking use included Cigarettes. He has a 20 pack-year smoking history. He has never used smokeless tobacco. He reports that he does not drink alcohol or use illicit drugs.  Allergies:  Allergies  Allergen Reactions  . Reglan [Metoclopramide]     ANXIETY, AGITATION, JITTERY    OUTPATIENT MEDICATIONS: No current facility-administered medications on file prior to encounter.   Current Outpatient Prescriptions on File Prior to Encounter  Medication Sig Dispense Refill  . amLODipine (NORVASC) 5 MG tablet Take 5 mg by mouth daily.    . calcium carbonate (TUMS - DOSED IN MG ELEMENTAL CALCIUM) 500 MG chewable tablet Chew 1 tablet by mouth daily as needed for indigestion or heartburn.    . dexamethasone (DECADRON) 4 MG tablet Take 2 tablets (8 mg total) by mouth 2 (two) times daily. 180 tablet 1  . dicyclomine (BENTYL) 10 MG capsule Take 10 mg by mouth 2 (two) times daily.    [name] Kitchen doxazosin (CARDURA) 2 MG tablet Take 2 mg by mouth daily.    . fluconazole (DIFLUCAN) 100 MG tablet Take 2 tablets today, then 1 tablet daily x 20 more days. 22 tablet 0  . HYDROcodone-acetaminophen (NORCO/VICODIN) 5-325 MG per tablet Take 1 tablet by mouth every 6 (six) hours as needed. 20 tablet 0  . LORazepam (  ATIVAN) 1 MG tablet Take 1 tablet (1 mg total) by mouth every 8 (eight) hours. 30 tablet 0  . mirtazapine (REMERON) 15 MG tablet Take 1 tablet (15 mg total) by mouth at bedtime. 90 tablet 3  . omeprazole (PRILOSEC) 20 MG capsule 1 PO 30 MINS PRIOR TO BREAKFAST AND AT BEDTIME 180 capsule 3  . Linaclotide (LINZESS) 145 MCG CAPS capsule 1 PO WITH BREAKFAST 30 capsule 11  . Multiple Vitamin (MULTIVITAMIN) capsule Take 1 capsule by mouth daily.    [name] Kitchen nystatin (MYCOSTATIN) 100000 UNIT/ML suspension Take 5 mLs (500,000 Units total) by mouth 3 (three) times daily. X 7 days (Patient not taking: Reported on 11/01/2014) 105 mL 0  . ondansetron (ZOFRAN ODT) 4 MG disintegrating tablet Take 1  tablet (4 mg total) by mouth every 6 (six) hours as needed for nausea. 30 tablet 3  . ondansetron (ZOFRAN) 4 MG tablet     . polyethylene glycol powder (GLYCOLAX/MIRALAX) powder Take 17 g by mouth daily as needed (for constipation).     [name] Kitchen tetrahydrozoline 0.05 % ophthalmic solution Place 1 drop into both eyes daily as needed (redness relief).       Results for orders placed or performed during the hospital encounter of 11/07/14 (from the past 48 hour(s))  APTT     Status: None   Collection Time: 11/07/14 10:23 AM  Result Value Ref Range   aPTT 25 24 - 37 seconds  Protime-INR     Status: Abnormal   Collection Time: 11/07/14 10:23 AM  Result Value Ref Range   Prothrombin Time 17.4 (H) 11.6 - 15.2 seconds   INR 1.42 0.00 - 1.49  CBC     Status: Abnormal   Collection Time: 11/07/14 10:23 AM  Result Value Ref Range   WBC 9.9 4.0 - 10.5 K/uL   RBC 5.95 (H) 4.22 - 5.81 MIL/uL   Hemoglobin 14.4 13.0 - 17.0 g/dL   HCT 41.9 39.0 - 52.0 %   MCV 70.4 (L) 78.0 - 100.0 fL   MCH 24.2 (L) 26.0 - 34.0 pg   MCHC 34.4 30.0 - 36.0 g/dL   RDW 17.0 (H) 11.5 - 15.5 %   Platelets 80 (L) 150 - 400 K/uL    Comment: PLATELET COUNT CONFIRMED BY SMEAR  Comprehensive metabolic panel     Status: Abnormal   Collection Time: 11/07/14 10:23 AM  Result Value Ref Range   Sodium 133 (L) 135 - 145 mmol/L   Potassium 5.0 3.5 - 5.1 mmol/L   Chloride 102 101 - 111 mmol/L   CO2 24 22 - 32 mmol/L   Glucose, Bld 120 (H) 65 - 99 mg/dL   BUN 25 (H) 6 - 20 mg/dL   Creatinine, Ser 1.18 0.61 - 1.24 mg/dL   Calcium 8.3 (L) 8.9 - 10.3 mg/dL   Total Protein 4.6 (L) 6.5 - 8.1 g/dL   Albumin 2.8 (L) 3.5 - 5.0 g/dL   AST 21 15 - 41 U/L   ALT 35 17 - 63 U/L   Alkaline Phosphatase 36 (L) 38 - 126 U/L   Total Bilirubin 0.9 0.3 - 1.2 mg/dL   GFR calc non Af Amer 59 (L) >60 mL/min   GFR calc Af Amer >60 >60 mL/min    Comment: (NOTE) The eGFR has been calculated using the CKD EPI equation. This calculation has not been  validated in all clinical situations. eGFR's persistently <60 mL/min signify possible Chronic Kidney Disease.    Anion gap 7 5 - 15  Type and screen     Status: None   Collection Time: 11/07/14 10:25 AM  Result Value Ref Range   ABO/RH(D) O POS    Antibody Screen NEG    Sample Expiration 11/10/2014   ABO/Rh     Status: None   Collection Time: 11/07/14 10:25 AM  Result Value Ref Range   ABO/RH(D) O POS   Surgical pcr screen     Status: Abnormal   Collection Time: 11/07/14 11:43 AM  Result Value Ref Range   MRSA, PCR NEGATIVE NEGATIVE   Staphylococcus aureus POSITIVE (A) NEGATIVE    Comment:        The Xpert SA Assay (FDA approved for NASAL specimens in patients over 50 years of age), is one component of a comprehensive surveillance program.  Test performance has been validated by [facility] for patients greater than or equal to 50 year old. It is not intended to diagnose infection nor to guide or monitor treatment.    Dg Chest 2 View  11/07/2014   CLINICAL DATA:  History of lung carcinoma  EXAM: CHEST  2 VIEW  COMPARISON:  Chest x-ray of 10/29/2014, PET-CT of 10/21/2014, and CT chest of 10/10/2014  FINDINGS: The prominent right paratracheal soft tissue noted previously by chest x-ray appears to have diminished in size in the interval. No infiltrate or effusion is seen. Probable nipple shadows are noted at the lung bases. No pleural effusion is seen. Heart size is stable. There are diffuse degenerative changes throughout the mid to lower thoracic spine.  IMPRESSION: 1. Apparent decrease in size of previously demonstrated right paratracheal soft tissue mass. 2. No infiltrate or effusion. Apparent nipple shadows at the lung bases.   Electronically Signed   By: Ivar Drape M.D.   On: 11/07/2014 11:27    ROS: General:no colds or fevers, no weight changes Skin:no rashes or ulcers HEENT:no blurred vision, no congestion CV:see HPI PUL:see HPI GI:no diarrhea constipation or melena,  no indigestion GU:no hematuria, no dysuria MS:no joint pain, no claudication Neuro:no syncope, no lightheadedness Endo:no diabetes, no thyroid disease   Blood pressure 117/83, pulse 93, temperature 97.2 F (36.2 C), temperature source Oral, resp. rate 15, weight 187 lb (84.823 kg), SpO2 99 %.  Wt Readings from Last 3 Encounters:  11/07/14 187 lb (84.823 kg)  11/04/14 187 lb (84.823 kg)  11/01/14 187 lb 4.8 oz (84.959 kg)    PE: General:Pleasant affect, NAD Skin:Warm and dry, brisk capillary refill HEENT:normocephalic, sclera clear, mucus membranes moist Neck:supple, no JVD, no bruits- incision at base of neck intact, no drainage  Heart:irreg irreg  without murmur, gallup, rub or click Lungs:clear, ant.  without rales, rhonchi, or wheezes VOJ:JKKX, non tender, + BS, do not palpate liver spleen or masses Ext:no lower ext edema, 2+ pedal pulses, 2+ radial pulses Neuro:alert and oriented X 3, MAE, follows commands, + facial symmetry Monitor a fib rate 81-91    Assessment/Plan Active Problems:   Atrial fibrillation- new in PACU, with rate control, no previous cardiac Hx.  CHA2DS2VASc is 2- surgery today with biopsy of , HTN, age. Check echo.  Check troponin. Possibly admit overnight, add po lopressor.    Essential hypertension   Brain metastasis   Lung mass   Non-small cell carcinoma of lung on biopsy today    [facility]  Nurse Practitioner Certified [place] Pager [phone] or after 5pm or weekends call [phone] 11/07/2014, 3:24 PM     I have personally seen and examined  this patient with Cecilie Kicks, NP. I agree with the assessment and plan as outlined above. He has new onset atrial fibrillation post procedure today. He is rate controlled. Will need to be admitted overnight. Would not anti-coagulate tonight following his biopsy today and given his brain mets. Start metoprolol 25 mg po BID. Echo in the am. We will follow along with you.    MCALHANY,CHRISTOPHER 11/07/2014 4:23 PM

## 2014-11-07 NOTE — OR Nursing (Signed)
Report received from [facility] for primary care.  Awaiting Cardiology consult for new onset Afib.  Will have Brother, Sterling, come to the bedside.

## 2014-11-07 NOTE — H&P (View-Only) (Signed)
PCP is Asencion Noble, MD Referring Provider is Javier Glazier, MD   Robynn Pane, PA Ancil Linsey, MD  Eppie Gibson, MD    Chief Complaint  Patient presents with  . Lung Mass    Surgical eval, PET Scan 10/21/14, Chest CT 10/10/14, MR Brain 10/23/14     HPI: 74 yo man sent for consultation re: lung mass/ adenopathy  Jared Gill is a 74 yo former smoker who recently has been found to have metastatic cancer. He recently began having double vision. He thought it was a problem with his glasses so he went to his opthamologist. He was referred back to Dr. Willey Blade. An MR of the brain revealed an isolated brain metastasis. Further workup included a chest CT which revealed a right hilar mass and mediastinal adenopathy. He was referred to Pulmonary. Dr. Ashok Cordia did a bronch and EBUS but it was nondiagnostic.   He has started radiation to the brain, but needs a tissue diagnosis to guide additional therapy.   Past Medical History  Diagnosis Date  . Hypertension   . Abnormal MRI, spine 05/12/2011  . Nausea 01/2014  . BPH (benign prostatic hypertrophy)   . Hx of adenomatous colonic polyps     h/o tubulovillous adenomas  . Diverticulitis   . Brain metastasis   . Metastasis to brain   . Lung mass     Right Upper Lobe  . GERD (gastroesophageal reflux disease)   . Blurred vision     Past Surgical History  Procedure Laterality Date  . Cholecystectomy  2009  . Polyp removal via colonoscopy    . Colonoscopy with esophagogastroduodenoscopy (egd) N/A 04/06/2013    JIR:[id number] size internal hemorrhoids/moderate diverticulosis/medium sized HH  . Tonsilectomy, adenoidectomy, bilateral myringotomy and tubes    . Video bronchoscopy with endobronchial ultrasound N/A 10/29/2014    Procedure: ATTEMPTED VIDEO BRONCHOSCOPY WITH ENDOBRONCHIAL ULTRASOUND;  Surgeon: Javier Glazier, MD;  Location: [facility] OR;  Service: Thoracic;  Laterality: N/A;    Family History  Problem Relation Age of Onset  . Diabetes Father    . Diabetes Sister   . Lung cancer Brother   . Colon cancer Neg Hx     Social History Social History  Substance Use Topics  . Smoking status: Former Smoker -- 0.50 packs/day for 40 years    Types: Cigarettes    Quit date: 10/17/2009  . Smokeless tobacco: Never Used  . Alcohol Use: No     Comment: Remote EtOH    Current Outpatient Prescriptions  Medication Sig Dispense Refill  . amLODipine (NORVASC) 5 MG tablet Take 5 mg by mouth daily.    . calcium carbonate (TUMS - DOSED IN MG ELEMENTAL CALCIUM) 500 MG chewable tablet Chew 1 tablet by mouth daily as needed for indigestion or heartburn.    . dexamethasone (DECADRON) 4 MG tablet Take 2 tablets (8 mg total) by mouth 2 (two) times daily. 180 tablet 1  . dicyclomine (BENTYL) 10 MG capsule Take 10 mg by mouth 2 (two) times daily.    [name] Kitchen doxazosin (CARDURA) 2 MG tablet Take 2 mg by mouth daily.    . fluconazole (DIFLUCAN) 100 MG tablet Take 2 tablets today, then 1 tablet daily x 20 more days. 22 tablet 0  . HYDROcodone-acetaminophen (NORCO/VICODIN) 5-325 MG per tablet Take 1 tablet by mouth every 6 (six) hours as needed. 20 tablet 0  . Linaclotide (LINZESS) 145 MCG CAPS capsule 1 PO WITH BREAKFAST 30 capsule 11  . LORazepam (ATIVAN) 1 MG tablet  Take 1 tablet (1 mg total) by mouth every 8 (eight) hours. 30 tablet 0  . mirtazapine (REMERON) 15 MG tablet Take 1 tablet (15 mg total) by mouth at bedtime. 90 tablet 3  . Multiple Vitamin (MULTIVITAMIN) capsule Take 1 capsule by mouth daily.    [name] Kitchen nystatin (MYCOSTATIN) 100000 UNIT/ML suspension Take 5 mLs (500,000 Units total) by mouth 3 (three) times daily. X 7 days (Patient not taking: Reported on 11/01/2014) 105 mL 0  . omeprazole (PRILOSEC) 20 MG capsule 1 PO 30 MINS PRIOR TO BREAKFAST AND AT BEDTIME 180 capsule 3  . ondansetron (ZOFRAN ODT) 4 MG disintegrating tablet Take 1 tablet (4 mg total) by mouth every 6 (six) hours as needed for nausea. 30 tablet 3  . ondansetron (ZOFRAN) 4 MG tablet     .  polyethylene glycol powder (GLYCOLAX/MIRALAX) powder Take 17 g by mouth daily as needed (for constipation).     [name] Kitchen tetrahydrozoline 0.05 % ophthalmic solution Place 1 drop into both eyes daily as needed (redness relief).     No current facility-administered medications for this visit.    Allergies  Allergen Reactions  . Reglan [Metoclopramide]     ANXIETY, AGITATION, JITTERY    Review of Systems  Constitutional: Positive for activity change and unexpected weight change (lost 4 pounds in 3 months). Negative for fever, chills and appetite change.  Eyes: Positive for visual disturbance (diplopia- right eye).  Respiratory: Positive for shortness of breath (with exertion). Negative for cough and wheezing.   Cardiovascular: Negative for chest pain and leg swelling.  Gastrointestinal: Positive for constipation. Negative for abdominal pain, diarrhea and blood in stool.  Genitourinary: Positive for frequency.  Musculoskeletal: Negative for back pain and arthralgias.  Neurological: Negative for dizziness, seizures, syncope and weakness.       Double vision  Hematological: Negative for adenopathy. Does not bruise/bleed easily.  All other systems reviewed and are negative.   BP 137/90 mmHg  Pulse 95  Resp 20  Ht '6\' 2"'$  (1.88 m)  Wt 187 lb (84.823 kg)  BMI 24.00 kg/m2  SpO2 97% Physical Exam  Constitutional: He is oriented to person, place, and time. He appears well-developed and well-nourished.  HENT:  Head: Normocephalic and atraumatic.  Mouth/Throat: No oropharyngeal exudate.  Eyes: Conjunctivae are normal. No scleral icterus.  Closes right eye to see  Neck: Normal range of motion. Neck supple. No thyromegaly present.  Cardiovascular: Normal rate, regular rhythm and normal heart sounds.  Exam reveals no friction rub.   No murmur heard. Pulmonary/Chest: Effort normal and breath sounds normal. He has no wheezes. He has no rales.  Abdominal: Soft. Bowel sounds are normal. He exhibits no  distension. There is no tenderness.  Musculoskeletal: He exhibits no edema or tenderness.  Lymphadenopathy:    He has no cervical adenopathy.  Neurological: He is alert and oriented to person, place, and time. A cranial nerve deficit is present. He exhibits normal muscle tone. Gait abnormal.  Skin: Skin is warm and dry.  Psychiatric: He has a normal mood and affect.  Vitals reviewed.    Diagnostic Tests: NUCLEAR MEDICINE PET SKULL BASE TO THIGH  TECHNIQUE: 10.3 mCi F-18 FDG was injected intravenously. Full-ring PET imaging was performed from the skull base to thigh after the radiotracer. CT data was obtained and used for attenuation correction and anatomic localization.  FASTING BLOOD GLUCOSE: Value: 108 mg/dl  COMPARISON: CT chest abdomen pelvis dated 10/10/2014.  FINDINGS: NECK  No hypermetabolic lymph nodes in the neck.  CHEST  3.3 x 2.8 cm medial right upper lobe mass (series 4/ image 53), max SUV 11.9.  Two central right upper lobe nodules measuring 1.3 and 2.0 cm (series 6/ image 23), max SUV 9.9.  Nodularity along the pleural fat with focal hypermetabolism overlying the right 10th-11th rib interspace, max SUV 6.5.  Associated thoracic lymphadenopathy, including:  --6 mm short axis right supraclavicular node (series 4/ image 47), max SUV 5.3  --13 mm short axis right paratracheal node (series 4/ image 66), max SUV 19.1  --12 mm short axis right hilar node (series 4/ image 36), max SUV 13.0  ABDOMEN/PELVIS  No abnormal hypermetabolic activity within the liver, pancreas, adrenal glands, or spleen.  Status post cholecystectomy. Atherosclerotic calcifications of the abdominal aorta and branch vessels.  No hypermetabolic lymph nodes in the abdomen or pelvis.  SKELETON  Focal hypermetabolism involving the anterior column of the right acetabulum, max SUV 5.7, although without CT correlate.  Focal hypermetabolism involving the left  anterior 4th rib, max SUV 5.8.  IMPRESSION: 3.3 cm medial right upper lobe mass with two central right upper lobe nodules measuring up to 2.0 cm, suspicious for primary bronchogenic neoplasm with a pancoast tumor.  Associated right supraclavicular, mediastinal, and right hilar nodal metastases.  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   Electronically Signed  By: Julian Hy M.D.  On: 10/21/2014 16:20   I personally reviewed the PET CT, CT chest and MR brain and concur with the radiologist's findings.  Impression: 74 yo former smoker with stage IV lung cancer who needs a tissue diagnosis to guide therapy. He had bronch/ EBUS which was nondiagnostic. Given the time concerns (he has already had to start treatment) and the need for adequate tissue for molecular testing, I think the best option at this point is mediastinoscopy.   I discussed the general nature of the procedure, the need for general anesthesia, and the incision to be used with Jared Gill, his brother and his nephew. They understand the general nature of the procedure and that we will plan to do it on an outpatient basis. I reviewed the indications, risks, benefits and alternatives. He understands the risks include, but are not limited to death, stroke, MI, DVT/PE, bleeding, possible need for transfusion, infections, recurrent nerve injury and esophageal injury. They understand this is a diagnostic, not a therapeutic procedure. He accepts the risks and agrees to proceed.  Plan: Mediastinoscopy on Thursday 11/07/2014  Jared Nakayama, MD [facility] [phone]

## 2014-11-07 NOTE — Transfer of Care (Signed)
Immediate Anesthesia Transfer of Care Note  Patient: Jared Gill  Procedure(s) Performed: Procedure(s): MEDIASTINOSCOPY (N/A)  Patient Location: PACU  Anesthesia Type:General  Level of Consciousness: awake, alert  and oriented  Airway & Oxygen Therapy: Patient Spontanous Breathing and Patient connected to face mask oxygen  Post-op Assessment: Report given to RN, Post -op Vital signs reviewed and stable and Patient moving all extremities X 4  Post vital signs: Reviewed and stable  Last Vitals:  Filed Vitals:   11/07/14 0934  BP: 144/84  Pulse: 72  Temp: 36.9 C  Resp: 18    Complications: No apparent anesthesia complications

## 2014-11-08 ENCOUNTER — Encounter (HOSPITAL_COMMUNITY): Payer: Self-pay | Admitting: Thoracic Surgery (Cardiothoracic Vascular Surgery)

## 2014-11-08 ENCOUNTER — Ambulatory Visit: Payer: Self-pay | Admitting: Pulmonary Disease

## 2014-11-08 ENCOUNTER — Inpatient Hospital Stay (HOSPITAL_COMMUNITY): Payer: Commercial Managed Care - HMO

## 2014-11-08 DIAGNOSIS — I10 Essential (primary) hypertension: Secondary | ICD-10-CM

## 2014-11-08 DIAGNOSIS — I4891 Unspecified atrial fibrillation: Secondary | ICD-10-CM

## 2014-11-08 DIAGNOSIS — C7931 Secondary malignant neoplasm of brain: Secondary | ICD-10-CM | POA: Diagnosis not present

## 2014-11-08 DIAGNOSIS — I9789 Other postprocedural complications and disorders of the circulatory system, not elsewhere classified: Secondary | ICD-10-CM | POA: Diagnosis not present

## 2014-11-08 DIAGNOSIS — I480 Paroxysmal atrial fibrillation: Secondary | ICD-10-CM

## 2014-11-08 DIAGNOSIS — C3490 Malignant neoplasm of unspecified part of unspecified bronchus or lung: Secondary | ICD-10-CM

## 2014-11-08 DIAGNOSIS — C7951 Secondary malignant neoplasm of bone: Secondary | ICD-10-CM | POA: Diagnosis not present

## 2014-11-08 DIAGNOSIS — C771 Secondary and unspecified malignant neoplasm of intrathoracic lymph nodes: Secondary | ICD-10-CM | POA: Diagnosis not present

## 2014-11-08 LAB — TROPONIN I: Troponin I: <0.03 ng/mL (ref <0.031)

## 2014-11-08 LAB — BASIC METABOLIC PANEL
Anion gap: 6 (ref 5–15)
BUN: 18 mg/dL (ref 6–20)
CO2: 26 mmol/L (ref 22–32)
Calcium: 8.3 mg/dL — ABNORMAL LOW (ref 8.9–10.3)
Chloride: 100 mmol/L — ABNORMAL LOW (ref 101–111)
Creatinine, Ser: 0.92 mg/dL (ref 0.61–1.24)
GFR calc Af Amer: >60 mL/min (ref >60)
GLUCOSE: 115 mg/dL — AB (ref 65–99)
POTASSIUM: 4.5 mmol/L (ref 3.5–5.1)
Sodium: 132 mmol/L — ABNORMAL LOW (ref 135–145)

## 2014-11-08 MED ORDER — ASPIRIN EC 325 MG PO TBEC
325.0000 mg | DELAYED_RELEASE_TABLET | Freq: Every day | ORAL | Status: DC
Start: 2014-11-08 — End: 2014-11-08
  Administered 2014-11-08: 325 mg via ORAL
  Filled 2014-11-08: qty 1

## 2014-11-08 MED ORDER — METOPROLOL TARTRATE 25 MG PO TABS: 25.0000 mg | ORAL_TABLET | Freq: Two times a day (BID) | ORAL | Status: AC

## 2014-11-08 MED ORDER — ASPIRIN 325 MG PO TBEC: 325.0000 mg | DELAYED_RELEASE_TABLET | Freq: Every day | ORAL | Status: DC

## 2014-11-08 MED ORDER — MENTHOL 3 MG MT LOZG
1.0000 | LOZENGE | OROMUCOSAL | Status: DC | PRN
  Filled 2014-11-08: qty 9

## 2014-11-08 MED ORDER — TRAMADOL HCL 50 MG PO TABS: 50.0000 mg | ORAL_TABLET | Freq: Four times a day (QID) | ORAL | Status: DC | PRN

## 2014-11-08 NOTE — Progress Notes (Signed)
  Echocardiogram 2D Echocardiogram has been performed.  Jared Gill 11/08/2014, 9:51 AM

## 2014-11-08 NOTE — Progress Notes (Addendum)
      [address]       [place] [address]             [phone]        1 Day Post-Op Procedure(s) (LRB): MEDIASTINOSCOPY (N/A)  Subjective: He has a little sore throat this am  Objective: Vital signs in last 24 hours: Temp:  [97.2 F (36.2 C)-98.5 F (36.9 C)] 97.6 F (36.4 C) (09/16 0503) Pulse Rate:  [72-111] 98 (09/16 0503) Cardiac Rhythm:  [-] Atrial flutter;Bundle branch block (09/16 0700) Resp:  [12-20] 17 (09/16 0503) BP: (109-144)/(73-96) 116/75 mmHg (09/16 0503) SpO2:  [95 %-100 %] 95 % (09/16 0503) Weight:  [187 lb (84.823 kg)] 187 lb (84.823 kg) (09/15 0934)   Current Weight  11/07/14 187 lb (84.823 kg)      Intake/Output from previous day: 09/15 0701 - 09/16 0700 In: 940 [P.O.:240; I.V.:700] Out: 810 [Urine:800; Blood:10]   Physical Exam:  Cardiovascular: IRRR IRRR Pulmonary: Clear to auscultation bilaterally; no rales, wheezes, or rhonchi. Abdomen: Soft, non tender, bowel sounds present. Wounds: Clean and dry.   Lab Results: CBC: Recent Labs  11/07/14 1023  WBC 9.9  HGB 14.4  HCT 41.9  PLT 80*   BMET:  Recent Labs  11/07/14 1023 11/08/14 0440  NA 133* 132*  K 5.0 4.5  CL 102 100*  CO2 24 26  GLUCOSE 120* 115*  BUN 25* 18  CREATININE 1.18 0.92  CALCIUM 8.3* 8.3*    PT/INR:  Lab Results  Component Value Date   INR 1.42 11/07/2014   INR 1.10 10/07/2014   ABG:  INR: Will add last result for INR, ABG once components are confirmed Will add last 4 CBG results once components are confirmed  Assessment/Plan:  1. CV - A fib/fl with HR in the 60's. Echo On Norvasc 5 mg daily, Lopressor 25 mg bid. Echo done this am-await results. May need anticoagulation (NOAC?) if does not convert. Cardiology to evaluate this am. 2. Pulmonary-on room air. 3. Throat lozenges po prn  ZIMMERMAN,DONIELLE MPA-C 11/08/2014,9:23 AM  appreciate Dr. Francesca Oman assistance No plan for anticoagulation. Risk outweighs benefit. Rate  controlled with beta blocker. Dc home this evening  Revonda Standard. Roxan Hockey, MD [facility] [phone]

## 2014-11-08 NOTE — Discharge Summary (Signed)
Physician Discharge Summary       [address]       [place] [address]             [phone]    Patient ID: Jared Gill MRN: [id number] DOB/AGE: 74-Jun-1942 74 y.o.  Admit date: 11/07/2014 Discharge date: 11/08/2014   Admission Diagnoses: 1. 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 History of essential hypertension 4. History of BPH 5. History of GERD 6. History of diverticulitis   Discharge Diagnoses:  1. 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 History of essential hypertension 4. History of BPH 5. History of GERD 6. History of diverticulitis 7. Postoperative atrial fibrillation   Consults: cardiology   Procedure:  MEDIASTINOSCOPY by Dr. Roxan Hockey on 11/07/2014   Pathology: Metastatic non-small cell carcinoma   History of Presenting Illness: Jared Gill is a 74 yo former smoker who recently has been found to have metastatic cancer. He recently began having double vision. He thought it was a problem with his glasses so he went to his opthamologist. He was referred back to Dr. Willey Blade. An MR of the brain revealed an isolated brain metastasis. Further workup included a chest CT which revealed a right hilar mass and mediastinal adenopathy. He was referred to Pulmonary. Dr. Ashok Cordia did a bronch and EBUS but it was nondiagnostic.   He has started radiation to the brain, but needs a tissue diagnosis to guide additional therapy.  Dr. Roxan Hockey reviewed the PET scan, CT chest, and MR brain. Patient has already had a bronchoscopy/EBUS, but this was non diagnostic. Dr. Roxan Hockey recommended a mediastinoscopy. Potential risks, benefits, and complications were discussed and he agreed to proceed with surgery.    Brief Hospital Course:  He went into atrial fibrillation in PACU after surgery. He was treated with beta blocker therapy and kept overnight for observation. Cardiology has seen the patient in consultation and does not feel that  he should be anticoagulated given his recent surgery as well as brain metastases. It was also felt that cardioversion was not an option, and that the goal of treatment will be rate control.  He was started on aspirin and a beta blocker and has remained stable and in rate controlled AF. An echocardiogram showed an EF 60% and mild dilation of the atrium.  Pathology was positive for metastatic non-small cell carcinoma. The patient has otherwise remained stable postoperatively and is medically stable for discharge home on today's date.      Latest Vital Signs: Blood pressure 108/73, pulse 65, temperature 97.8 F (36.6 C), temperature source Oral, resp. rate 20, weight 187 lb (84.823 kg), SpO2 99 %.  Physical Exam: Cardiovascular: IRRR IRRR Pulmonary: Clear to auscultation bilaterally; no rales, wheezes, or rhonchi. Abdomen: Soft, non tender, bowel sounds present. Wounds: Clean and dry.    Discharge Condition:Stable and discharged to home  Recent laboratory studies:  Lab Results  Component Value Date   WBC 9.9 11/07/2014   HGB 14.4 11/07/2014   HCT 41.9 11/07/2014   MCV 70.4* 11/07/2014   PLT 80* 11/07/2014   Lab Results  Component Value Date   NA 132* 11/08/2014   K 4.5 11/08/2014   CL 100* 11/08/2014   CO2 26 11/08/2014   CREATININE 0.92 11/08/2014   GLUCOSE 115* 11/08/2014      Diagnostic Studies: Dg Chest 2 View  11/07/2014   CLINICAL DATA:  History of lung carcinoma  EXAM: CHEST  2 VIEW  COMPARISON:  Chest x-ray  of 10/29/2014, PET-CT of 10/21/2014, and CT chest of 10/10/2014  FINDINGS: The prominent right paratracheal soft tissue noted previously by chest x-ray appears to have diminished in size in the interval. No infiltrate or effusion is seen. Probable nipple shadows are noted at the lung bases. No pleural effusion is seen. Heart size is stable. There are diffuse degenerative changes throughout the mid to lower thoracic spine.  IMPRESSION: 1. Apparent decrease in size of  previously demonstrated right paratracheal soft tissue mass. 2. No infiltrate or effusion. Apparent nipple shadows at the lung bases.   Electronically Signed   By: Ivar Drape M.D.   On: 11/07/2014 11:27   Ct Chest W Contrast  10/10/2014   CLINICAL DATA:  Brain lesion on recent MRI  EXAM: CT CHEST, ABDOMEN, AND PELVIS WITH CONTRAST  TECHNIQUE: Multidetector CT imaging of the chest, abdomen and pelvis was performed following the standard protocol during bolus administration of intravenous contrast.  CONTRAST:  136m OMNIPAQUE IOHEXOL 300 MG/ML  SOLN  COMPARISON:  CT scan abdomen and pelvis 04/29/2014 and CT scan of the chest 09/20/2012  FINDINGS: CT CHEST FINDINGS  Sagittal images of the spine shows mild degenerative changes thoracic spine. Sagittal images of the sternum are unremarkable.  Images of the thoracic inlet are unremarkable.  Central airways are patent. Mild thickening of mid esophageal wall. A precarinal lymph node measures 1.7 by 1.3 cm in diameter. There is a right hilar lymph node measures 1.4 by 1.7 cm.  There is a right upper lobe posterior medial lesion abutting/ close proximity with right paratracheal region right upper mediastinum. The lesion measures at least 2.5 x 3 cm. This is best seen in coronal image 65 measures at least 3.9 cm cranial caudally.  This is highly suspicious for primary malignancy. There is a second nodular density in right upper lobe sent is a centrally suprahilar region measures 1.4 cm suspicious for satellite lesion.  A nodule in right middle lobe measures 1 cm suspicious for metastatic disease. No left lung nodules are noted. Mild emphysematous changes are noted bilateral upper lobe. No acute infiltrate or pulmonary edema.  No destructive rib lesions are noted.  CT ABDOMEN AND PELVIS FINDINGS  Degenerative changes are noted lumbar spine. Atherosclerotic calcifications of abdominal aorta. No aortic aneurysm.  Enhanced liver is unremarkable. Status postcholecystectomy. The  pancreas, spleen and adrenal glands are unremarkable.  Kidneys are symmetrical in size and enhancement. No hydronephrosis or hydroureter.  Abundant stool noted within cecum. No pericecal inflammation. Normal appendix is noted in axial image 85. Multiple descending colon diverticula. Multiple sigmoid colon diverticula. No evidence of acute diverticulitis.  There is no small bowel obstruction. No ascites or free air. No adenopathy. Prostate gland and seminal vesicles are unremarkable. Small nonspecific inguinal lymph nodes are noted. Degenerative changes pubic symphysis.  IMPRESSION: 1. There is a right upper lobe/right mediastinal mass measures at least 2.5 x 3 x 4 cm. This is highly suspicious for primary malignancy. Pathologic lymph nodes are noted precarinal region and right hilum. Nodular suprahilar lesion right upper lobe suspicious for satellite lesion. A nodule in right middle lobe measures 1 cm suspicious for metastatic disease. No left hilar adenopathy. No left lung lesions. 2. No acute infiltrate or pulmonary edema. Bilateral mild emphysematous changes. 3. Extensive atherosclerotic calcifications of abdominal aorta. 4. No evidence of metastatic disease within abdomen. 5. Abundant stool noted within cecum.  No pericecal inflammation. 6. Multiple colonic diverticula descending colon and sigmoid colon. No evidence of acute diverticulitis. 7.  Unremarkable prostate gland and urinary bladder. 8. Small nonspecific bilateral inguinal lymph nodes.  These results were called by telephone at the time of interpretation on 10/10/2014 at 4:26 pm to Dr. Asencion Noble , who verbally acknowledged these results.   Electronically Signed   By: Lahoma Crocker M.D.   On: 10/10/2014 16:27   Mr Jeri Cos HC Contrast  10/23/2014   CLINICAL DATA:  Brain metastasis. SRS protocol for treatment planning.  EXAM: MRI HEAD WITHOUT AND WITH CONTRAST  TECHNIQUE: Multiplanar, multiecho pulse sequences of the brain and surrounding structures were  obtained without and with intravenous contrast.  CONTRAST:  31m MULTIHANCE GADOBENATE DIMEGLUMINE 529 MG/ML IV SOLN  COMPARISON:  10/07/2014  FINDINGS: Calvarium and upper cervical spine: No focal marrow signal abnormality.  Orbits: No evidence of metastasis.  Sinuses and Mastoids: Mucosal thickening with bilateral maxillary sinus secretion retention. Mucous layers within the nasopharynx. Mastoid and middle ears are clear.  Brain: Solitary mass in the right midbrain, between the cerebral aqua duct and cerebral peduncle, 4 mm larger than previous at 16 mm diameter. There is associated increase in cerebral aqua duct mass effect, but no change ventricular volume to suggest obstructive hydrocephalus. No periventricular edema. Mild vasogenic edema around the mass is stable. No other evidence of intracranial disease.  Mild for age chronic small vessel ischemia in the bilateral cerebral white matter. Generalized cerebral volume loss. No acute infarct, acute hemorrhage, or major vessel occlusion.  IMPRESSION: 16 mm solitary metastasis in the right midbrain has grown by 4 mm since 2 weeks ago. Increased mass effect on the cerebral aqua duct but stable ventricular volume.   Electronically Signed   By: JMonte FantasiaM.D.   On: 10/23/2014 10:37   Ct Abdomen Pelvis W Contrast  10/10/2014   CLINICAL DATA:  Brain lesion on recent MRI  EXAM: CT CHEST, ABDOMEN, AND PELVIS WITH CONTRAST  TECHNIQUE: Multidetector CT imaging of the chest, abdomen and pelvis was performed following the standard protocol during bolus administration of intravenous contrast.  CONTRAST:  1021mOMNIPAQUE IOHEXOL 300 MG/ML  SOLN  COMPARISON:  CT scan abdomen and pelvis 04/29/2014 and CT scan of the chest 09/20/2012  FINDINGS: CT CHEST FINDINGS  Sagittal images of the spine shows mild degenerative changes thoracic spine. Sagittal images of the sternum are unremarkable.  Images of the thoracic inlet are unremarkable.  Central airways are patent. Mild  thickening of mid esophageal wall. A precarinal lymph node measures 1.7 by 1.3 cm in diameter. There is a right hilar lymph node measures 1.4 by 1.7 cm.  There is a right upper lobe posterior medial lesion abutting/ close proximity with right paratracheal region right upper mediastinum. The lesion measures at least 2.5 x 3 cm. This is best seen in coronal image 65 measures at least 3.9 cm cranial caudally.  This is highly suspicious for primary malignancy. There is a second nodular density in right upper lobe sent is a centrally suprahilar region measures 1.4 cm suspicious for satellite lesion.  A nodule in right middle lobe measures 1 cm suspicious for metastatic disease. No left lung nodules are noted. Mild emphysematous changes are noted bilateral upper lobe. No acute infiltrate or pulmonary edema.  No destructive rib lesions are noted.  CT ABDOMEN AND PELVIS FINDINGS  Degenerative changes are noted lumbar spine. Atherosclerotic calcifications of abdominal aorta. No aortic aneurysm.  Enhanced liver is unremarkable. Status postcholecystectomy. The pancreas, spleen and adrenal glands are unremarkable.  Kidneys are symmetrical in size and enhancement.  No hydronephrosis or hydroureter.  Abundant stool noted within cecum. No pericecal inflammation. Normal appendix is noted in axial image 85. Multiple descending colon diverticula. Multiple sigmoid colon diverticula. No evidence of acute diverticulitis.  There is no small bowel obstruction. No ascites or free air. No adenopathy. Prostate gland and seminal vesicles are unremarkable. Small nonspecific inguinal lymph nodes are noted. Degenerative changes pubic symphysis.  IMPRESSION: 1. There is a right upper lobe/right mediastinal mass measures at least 2.5 x 3 x 4 cm. This is highly suspicious for primary malignancy. Pathologic lymph nodes are noted precarinal region and right hilum. Nodular suprahilar lesion right upper lobe suspicious for satellite lesion. A nodule in  right middle lobe measures 1 cm suspicious for metastatic disease. No left hilar adenopathy. No left lung lesions. 2. No acute infiltrate or pulmonary edema. Bilateral mild emphysematous changes. 3. Extensive atherosclerotic calcifications of abdominal aorta. 4. No evidence of metastatic disease within abdomen. 5. Abundant stool noted within cecum.  No pericecal inflammation. 6. Multiple colonic diverticula descending colon and sigmoid colon. No evidence of acute diverticulitis. 7. Unremarkable prostate gland and urinary bladder. 8. Small nonspecific bilateral inguinal lymph nodes.  These results were called by telephone at the time of interpretation on 10/10/2014 at 4:26 pm to Dr. Asencion Noble , who verbally acknowledged these results.   Electronically Signed   By: Lahoma Crocker M.D.   On: 10/10/2014 16:27   Nm Pet Image Initial (pi) Skull Base To Thigh  10/21/2014   CLINICAL DATA:  Initial treatment strategy for right lung mass with brain metastases.  EXAM: NUCLEAR MEDICINE PET SKULL BASE TO THIGH  TECHNIQUE: 10.3 mCi F-18 FDG was injected intravenously. Full-ring PET imaging was performed from the skull base to thigh after the radiotracer. CT data was obtained and used for attenuation correction and anatomic localization.  FASTING BLOOD GLUCOSE:  Value: 108 mg/dl  COMPARISON:  CT chest abdomen pelvis dated 10/10/2014.  FINDINGS: NECK  No hypermetabolic lymph nodes in the neck.  CHEST  3.3 x 2.8 cm medial right upper lobe mass (series 4/ image 53), max SUV 11.9.  Two central right upper lobe nodules measuring 1.3 and 2.0 cm (series 6/ image 23), max SUV 9.9.  Nodularity along the pleural fat with focal hypermetabolism overlying the right 10th-11th rib interspace, max SUV 6.5.  Associated thoracic lymphadenopathy, including:  --6 mm short axis right supraclavicular node (series 4/ image 47), max SUV 5.3  --13 mm short axis right paratracheal node (series 4/ image 66), max SUV 19.1  --12 mm short axis right hilar node  (series 4/ image 36), max SUV 13.0  ABDOMEN/PELVIS  No abnormal hypermetabolic activity within the liver, pancreas, adrenal glands, or spleen.  Status post cholecystectomy. Atherosclerotic calcifications of the abdominal aorta and branch vessels.  No hypermetabolic lymph nodes in the abdomen or pelvis.  SKELETON  Focal hypermetabolism involving the anterior column of the right acetabulum, max SUV 5.7, although without CT correlate.  Focal hypermetabolism involving the left anterior 4th rib, max SUV 5.8.  IMPRESSION: 3.3 cm medial right upper lobe mass with two central right upper lobe nodules measuring up to 2.0 cm, suspicious for primary bronchogenic neoplasm with a pancoast tumor.  Associated right supraclavicular, mediastinal, and right hilar nodal metastases.  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   Electronically Signed   By: Julian Hy M.D.   On: 10/21/2014 16:20    Discharge Medications:   Medication List  TAKE these medications        amLODipine 5 MG tablet  Commonly known as:  NORVASC  Take 5 mg by mouth daily.     aspirin 325 MG EC tablet  Take 1 tablet (325 mg total) by mouth daily.     calcium carbonate 500 MG chewable tablet  Commonly known as:  TUMS - dosed in mg elemental calcium  Chew 1 tablet by mouth daily as needed for indigestion or heartburn.     dexamethasone 4 MG tablet  Commonly known as:  DECADRON  Take 2 tablets (8 mg total) by mouth 2 (two) times daily.     dicyclomine 10 MG capsule  Commonly known as:  BENTYL  Take 10 mg by mouth 2 (two) times daily.     doxazosin 2 MG tablet  Commonly known as:  CARDURA  Take 2 mg by mouth daily.     fluconazole 100 MG tablet  Commonly known as:  DIFLUCAN  Take 2 tablets today, then 1 tablet daily x 20 more days.     HYDROcodone-acetaminophen 5-325 MG per tablet  Commonly known as:  NORCO/VICODIN  Take 1 tablet by mouth every 6 (six) hours as  needed.     Linaclotide 145 MCG Caps capsule  Commonly known as:  LINZESS  1 PO WITH BREAKFAST     LORazepam 1 MG tablet  Commonly known as:  ATIVAN  Take 1 tablet (1 mg total) by mouth every 8 (eight) hours.     metoprolol tartrate 25 MG tablet  Commonly known as:  LOPRESSOR  Take 1 tablet (25 mg total) by mouth 2 (two) times daily.     mirtazapine 15 MG tablet  Commonly known as:  REMERON  Take 1 tablet (15 mg total) by mouth at bedtime.     multivitamin capsule  Take 1 capsule by mouth daily.     nystatin 100000 UNIT/ML suspension  Commonly known as:  MYCOSTATIN  Take 5 mLs (500,000 Units total) by mouth 3 (three) times daily. X 7 days     omeprazole 20 MG capsule  Commonly known as:  PRILOSEC  1 PO 30 MINS PRIOR TO BREAKFAST AND AT BEDTIME     ondansetron 4 MG disintegrating tablet  Commonly known as:  ZOFRAN ODT  Take 1 tablet (4 mg total) by mouth every 6 (six) hours as needed for nausea.     ondansetron 4 MG tablet  Commonly known as:  ZOFRAN     polyethylene glycol powder powder  Commonly known as:  GLYCOLAX/MIRALAX  Take 17 g by mouth daily as needed (for constipation).     tetrahydrozoline 0.05 % ophthalmic solution  Place 1 drop into both eyes daily as needed (redness relief).     traMADol 50 MG tablet  Commonly known as:  ULTRAM  Take 1-2 tablets (50-100 mg total) by mouth every 6 (six) hours as needed (pain).         Follow Up Appointments: Follow-up Information    Follow up with Ermalinda Barrios, PA-C On 11/25/2014.   Specialty:  Cardiology   Why:  10:45am. Cardiology followup   Contact information:   [address] [place] [address] [phone]       Follow up with Melrose Nakayama, MD On 11/26/2014.   Specialty:  Cardiothoracic Surgery   Why:  Appointment time is at 9:45 am   Contact information:   [address] [place] [address] [phone]  Signed: Talynn Lebon,GINA HPA-C 11/08/2014, 5:37  PM

## 2014-11-08 NOTE — Progress Notes (Signed)
Utilization review completed.  

## 2014-11-08 NOTE — Progress Notes (Signed)
Patient and brother, Birdena Crandall provided with discharge instructions, with voiced understanding. All patient belongings in patient possession prior to patient prior to discharge.

## 2014-11-08 NOTE — Care Management Note (Signed)
Case Management Note  Patient Details  Name: Jared Gill MRN: [id number] Date of Birth: 03-Sep-1940  Subjective/Objective:          Pt admitted with essential hypertention and arrythimia          Action/Plan:  Per brother Jared Gill; pt is independent from home with brothers.  Pt uses walker "sometimes" at home, but mostly walks independently.  Pt informed CM that brother signs legal documents for him.   CM will continue to monitor for disposition needs   Expected Discharge Date:                  Expected Discharge Plan:  [place]  In-House Referral:     Discharge planning Services  CM Consult  Post Acute Care Choice:    Choice offered to:     DME Arranged:    DME Agency:     HH Arranged:    HH Agency:     Status of Service:  In process, will continue to follow  Medicare Important Message Given:    Date Medicare IM Given:    Medicare IM give by:    Date Additional Medicare IM Given:    Additional Medicare Important Message give by:     If discussed at [place] of Stay Meetings, dates discussed:    Additional Comments:  Maryclare Labrador, RN 11/08/2014, 4:13 PM

## 2014-11-08 NOTE — Progress Notes (Signed)
Patient Name: Jared Gill Date of Encounter: 11/08/2014  Primary Cardiologist: Dr. Angelena Form   Active Problems:   Essential hypertension   Brain metastasis   Lung mass   Non-small cell carcinoma of lung   Atrial fibrillation   Postoperative atrial fibrillation    SUBJECTIVE  Denies any discomfort. Asks when he can walk again.   CURRENT MEDS . amLODipine  5 mg Oral Daily  . dexamethasone  8 mg Oral Q12H  . dicyclomine  10 mg Oral BID  . doxazosin  2 mg Oral Daily  . fluconazole  100 mg Oral Daily  . Linaclotide  145 mcg Oral Daily  . LORazepam  1 mg Oral 3 times per day  . metoprolol tartrate  25 mg Oral BID  . mirtazapine  15 mg Oral QHS  . naphazoline-pheniramine  1 drop Both Eyes Daily  . pantoprazole  40 mg Oral BID  . sodium chloride  3 mL Intravenous Q12H    OBJECTIVE  Filed Vitals:   11/07/14 1950 11/08/14 0503 11/08/14 1118 11/08/14 1202  BP: 109/73 116/75 116/75 116/75  Pulse: 110 98 88   Temp: 97.7 F (36.5 C) 97.6 F (36.4 C)    TempSrc: Oral Oral    Resp: 17 17    Weight:      SpO2: 100% 95%      Intake/Output Summary (Last 24 hours) at 11/08/14 1224 Last data filed at 11/08/14 1203  Gross per 24 hour  Intake   1183 ml  Output   1510 ml  Net   -327 ml   Filed Weights   11/07/14 0934  Weight: 187 lb (84.823 kg)    PHYSICAL EXAM  General: Pleasant, NAD. Neuro: Alert, but not oriented. Moves all extremities spontaneously. Psych: Normal affect. HEENT:  Normal  Neck: Supple without bruits or JVD. Lungs:  Resp regular and unlabored, anterior exam CTA. Heart: irregular. no s3, s4, or murmurs. Abdomen: Soft, non-tender, non-distended, BS + x 4.  Extremities: No clubbing, cyanosis or edema. DP/PT/Radials 2+ and equal bilaterally.  Accessory Clinical Findings  CBC  Recent Labs  11/07/14 1023  WBC 9.9  HGB 14.4  HCT 41.9  MCV 70.4*  PLT 80*   Basic Metabolic Panel  Recent Labs  11/07/14 1023 11/08/14 0440  NA 133* 132*   K 5.0 4.5  CL 102 100*  CO2 24 26  GLUCOSE 120* 115*  BUN 25* 18  CREATININE 1.18 0.92  CALCIUM 8.3* 8.3*   Liver Function Tests  Recent Labs  11/07/14 1023  AST 21  ALT 35  ALKPHOS 36*  BILITOT 0.9  PROT 4.6*  ALBUMIN 2.8*   Cardiac Enzymes  Recent Labs  11/07/14 1645 11/07/14 2224 11/08/14 0440  TROPONINI <0.03 <0.03 <0.03    TELE A-fib with HR 50-80s    ECG  A-fib with HR 80s  Echocardiogram 11/08/2014  LV EF: 60%  ------------------------------------------------------------------- Indications:   Atrial fibrillation - 427.31.  ------------------------------------------------------------------- Study Conclusions  - Left ventricle: The cavity size was normal. Wall thickness was increased in a pattern of moderate LVH. The estimated ejection fraction was 60%. Regional wall motion abnormalities cannot be excluded. - Left atrium: The atrium was mildly dilated. - Right ventricle: The cavity size was mildly dilated. Systolic function was normal.    Radiology/Studies  Dg Chest 2 View  11/07/2014   CLINICAL DATA:  History of lung carcinoma  EXAM: CHEST  2 VIEW  COMPARISON:  Chest x-ray of 10/29/2014, PET-CT of 10/21/2014, and  CT chest of 10/10/2014  FINDINGS: The prominent right paratracheal soft tissue noted previously by chest x-ray appears to have diminished in size in the interval. No infiltrate or effusion is seen. Probable nipple shadows are noted at the lung bases. No pleural effusion is seen. Heart size is stable. There are diffuse degenerative changes throughout the mid to lower thoracic spine.  IMPRESSION: 1. Apparent decrease in size of previously demonstrated right paratracheal soft tissue mass. 2. No infiltrate or effusion. Apparent nipple shadows at the lung bases.   Electronically Signed   By: Ivar Drape M.D.   On: 11/07/2014 11:27   TTE: 11/08/2014 Left ventricle: The cavity size was normal. Wall thickness was increased in a  pattern of moderate LVH. The estimated ejection fraction was 60%. Regional wall motion abnormalities cannot be excluded. - Left atrium: The atrium was mildly dilated. - Right ventricle: The cavity size was mildly dilated. Systolic function was normal.   ASSESSMENT AND PLAN  74 yo male treated for metastic cancer with lung mass and isolated brain metastasis. Underwent stereotactic radiosurgery by Neurosurgery on 9/2 and mediastinoscopy by CT surgery on 11/07/2014 revealing non-small cell carcinoma. He developed afib with controlled rate in PACU.  1. New onset of paroxysmal atrial fibrillation  - CHA2DS2-VASC score 2 (age, HTN)  - HR 50-80s on metoprolol '25mg'$  BID.   - Would not anti-coagulate tonight following his biopsy and given his brain mets. Unclear benefit of ASA at this point given his condition.  - echo EF 60, mildly dilated LA, regional wall motion abnormalities cannot be excluded. No further cardiac workup expected during this admission. I will arrange outpatient followup.   2. Metastatic cancer  3. HTN   Signed, Almyra Deforest PA-C Pager: [phone]   The patient was seen, examined and discussed with Almyra Deforest, PA-C and I agree with the above.   74 yo male treated for metastic cancer with lung mass and isolated brain metastasis. Underwent stereotactic radiosurgery by Neurosurgery on 9/2 and mediastinoscopy by CT surgery on 11/07/2014 revealing non-small cell carcinoma. He developed afib - new onset on PACU. His LVEF is normal and left atrium is mildly dilated. We don't recommend to anticoagulate given recent biopsy and brain metastasis. Therefore cardioversion is not an option and rate control would be goal here. Start aspirin 325 mg po daily.  He is rate controlled on metoprolol 25 mg po bid. No further cardiac ork up necessary, we will arrange for outpatient follow up at the discharge.   Dorothy Spark 11/08/2014

## 2014-11-08 NOTE — Op Note (Signed)
NAMEQUEST, TAVENNER NO.:  [id number]  MEDICAL RECORD NO.:  [id number]  LOCATION:  2W27C                        FACILITY:  [facility]  PHYSICIAN:  Revonda Standard. Roxan Hockey, M.D.DATE OF BIRTH:  [birth year]  DATE OF PROCEDURE:  11/07/2014 DATE OF DISCHARGE:                              OPERATIVE REPORT   PREOPERATIVE DIAGNOSIS:  Mediastinal adenopathy, probable stage IV lung cancer.  POSTOPERATIVE DIAGNOSIS:  Stage IV non-small cell carcinoma.  PROCEDURE:  Mediastinoscopy.  SURGEON:  Revonda Standard. Roxan Hockey, MD  ASSISTANT:  None.  ANESTHESIA:  General.  FINDINGS:  Grossly enlarged level 4R and 7 lymph nodes.  Frozen section of 4R nodes revealed non-small cell carcinoma.  CLINICAL NOTE:  Mr. Mula is a 74 year old former smoker who recently was found to have metastatic cancer. He originally presented with double vision.  Workup included a MRI of the brain which showed a brain mass consistent with a metastasis.  A chest CT showed a right hilar mass and mediastinal adenopathy.  He was referred to Pulmonary.  Bronchoscopy and endobronchial ultrasound was nondiagnostic.  He has been started on radiation therapy to the brain due to symptoms, but still needs a tissue diagnosis to direct therapy.  I recommended that we proceed with mediastinoscopy.  The indications, risks, benefits, and alternatives were discussed in detail with the patient.  He understood and accepted the risks and agreed to proceed.  OPERATIVE NOTE:  Mr. Cwikla was brought to the operating room on November 07, 2014.  He was given intravenous antibiotics.  He was anesthetized and intubated.  Sequential compression devices were placed on the calves for DVT prophylaxis.  The patient was prepped from the chin to the naval and then draped in standard fashion.  After performing a surgical time- out, an incision was made in the lower neck 1 fingerbreadth above the sternal notch.  It was carried through the skin  and subcutaneous tissue. The pretracheal fascia was identified and incised.  The pretracheal plane was developed bluntly into the mediastinum.  The mediastinoscope was inserted. Systematic inspection of the mediastinal lymph nodes station was carried out.  Almost immediately upon placing the scope and looking to the right, there was a level 4R node, this node was large, anthracotic, and granular in appearance.  Multiple biopsies were taken of this node and sent for frozen section.  The remainder of the node was sent for permanent pathology.  While awaiting the results of the frozen section, a 2nd 4R node was identified, this was slightly smaller than the 1st. It was removed as well.  Next, the scope was advanced to the subcarinal area and a level 7 node was identified. Multiple biopsies were taken of this node as well.  It was similar in appearance to the 4R nodes.  The scope was withdrawn.  The wound was packed with gauze.  After 5 minutes, the gauze was removed.  The scope was reinserted.  On reinserting the scope, another 4R node was readily apparent.  This node was larger and firmer in consistency than the other nodes, but otherwise similar in appearance.  Biopsies were taken of this Node. It was relatively vascular node and bled.  The  area was packed with Surgicel and then gauze was placed in the wound as well.  At this point, the frozen section returned showing non-small cell carcinoma. After 5 minutes the packing was removed.  The mediastinoscope was reinserted.  A final inspection was made for hemostasis.  The mediastinoscope was withdrawn. The incision was closed in 2 layers, a 3-0 Vicryl interrupted subcutaneous layer followed by a 4-0 Vicryl subcuticular suture. Dermabond was applied to the wound.  The patient was extubated in the operating room and taken to the postanesthetic care unit in good condition.     Revonda Standard Roxan Hockey, M.D.     SCH/MEDQ  D:  11/07/2014  T:   11/08/2014  Job:  [id number]

## 2014-11-11 ENCOUNTER — Other Ambulatory Visit (HOSPITAL_COMMUNITY): Payer: Self-pay | Admitting: *Deleted

## 2014-11-13 ENCOUNTER — Encounter (HOSPITAL_COMMUNITY): Payer: Self-pay | Admitting: Hematology & Oncology

## 2014-11-13 ENCOUNTER — Encounter (HOSPITAL_BASED_OUTPATIENT_CLINIC_OR_DEPARTMENT_OTHER): Payer: Commercial Managed Care - HMO | Admitting: Hematology & Oncology

## 2014-11-13 VITALS — BP 111/74 | HR 69 | Temp 98.0°F | Resp 15 | Wt 178.3 lb

## 2014-11-13 DIAGNOSIS — C3491 Malignant neoplasm of unspecified part of right bronchus or lung: Secondary | ICD-10-CM | POA: Diagnosis not present

## 2014-11-13 DIAGNOSIS — C7931 Secondary malignant neoplasm of brain: Secondary | ICD-10-CM

## 2014-11-13 DIAGNOSIS — H547 Unspecified visual loss: Secondary | ICD-10-CM

## 2014-11-13 DIAGNOSIS — R634 Abnormal weight loss: Secondary | ICD-10-CM | POA: Diagnosis not present

## 2014-11-13 NOTE — Progress Notes (Signed)
[facility] NOTE  Patient Care Team: Asencion Noble, MD as PCP - General Danie Binder, MD as Consulting Physician (Gastroenterology)  CHIEF COMPLAINTS/PURPOSE OF CONSULTATION:  Stage IV NSCLC Brain Metastases Weight Loss  HISTORY OF PRESENTING ILLNESS:  Jared Gill 74 y.o. male is here for follow-up of stage IV NSCLC, adenocarcinoma.  He has completed XRT with Dr. Isidore Moos. Final pathology is c/w adenocarcinoma, [facility] testing is sent and pending.   Jared Gill is here today with his two brothers.   His vision is a lot worse today, and he is experiencing double vision. His brother says that, up until lately, Jared Gill has been saying that if he closes one eye, he can see better out of the other. In the past, this method has been successful, but he is experiencing trouble with both eyes today.  Jared Gill notes that he's been eating well. He and his brothers state that he's also been walking around, but that he has to keep a hold on things for stabilization due to his bad vision. His brother watches him and takes care of him during these times. His brother also says he "makes sure" that Jared Gill is not getting up in the middle of the night, when he could possibly fall and hurt himself.  His brothers confirm that he is up most of the day. He's talking, he visits with people, he watches TV (Mr. Idrovo says he listens to TV); he is certainly not laying in bed all day.  His brothers also get up and walk with him at times, and try to get him out of the house as much as possible.  On chart review his weight is down another 10 pounds.  MEDICAL HISTORY:  Past Medical History  Diagnosis Date  . Hypertension   . Abnormal MRI, spine 05/12/2011  . Nausea 01/2014  . BPH (benign prostatic hypertrophy)   . Hx of adenomatous colonic polyps     h/o tubulovillous adenomas  . Diverticulitis   . Brain metastasis   . Metastasis to brain   . Lung mass     Right  Upper Lobe  . GERD (gastroesophageal reflux disease)   . Blurred vision   . Brain metastases     SURGICAL HISTORY: Past Surgical History  Procedure Laterality Date  . Cholecystectomy  2009  . Polyp removal via colonoscopy    . Colonoscopy with esophagogastroduodenoscopy (egd) N/A 04/06/2013    PXT:GGYIRSWN size internal hemorrhoids/moderate diverticulosis/medium sized HH  . Tonsilectomy, adenoidectomy, bilateral myringotomy and tubes    . Video bronchoscopy with endobronchial ultrasound N/A 10/29/2014    Procedure: ATTEMPTED VIDEO BRONCHOSCOPY WITH ENDOBRONCHIAL ULTRASOUND;  Surgeon: Javier Glazier, MD;  Location: [facility];  Service: Thoracic;  Laterality: N/A;  . Mediastinoscopy N/A 11/07/2014    Procedure: MEDIASTINOSCOPY;  Surgeon: Melrose Nakayama, MD;  Location: [facility];  Service: Thoracic;  Laterality: N/A;    SOCIAL HISTORY: Social History   Social History  . Marital Status: Single    Spouse Name: N/A  . Number of Children: 0  . Years of Education: N/A   Occupational History  . retired    Social History Main Topics  . Smoking status: Former Smoker -- 0.50 packs/day for 40 years    Types: Cigarettes    Quit date: 10/17/2009  . Smokeless tobacco: Never Used  . Alcohol Use: No     Comment: Remote EtOH  . Drug Use: No  .  Sexual Activity: Not on file   Other Topics Concern  . Not on file   Social History Narrative   Originally from [place]. Previously worked as a Training and development officer at [employer]. He worked in the [employer]. He previously worked Audiological scientist for [employer] Has no pets currently. No mold exposure. No asbestos exposure.   Single, never married. No children. Lives in [place]. Ex smoker, began at an early age. Quit for over 2 years. ETOH, previously drank "not a whole lot". He worked at [employer] for 35 years.  FAMILY HISTORY: Family History  Problem Relation Age of Onset  . Diabetes Father   . Diabetes Sister   . Lung cancer Brother     . CVA Brother   . Colon cancer Neg Hx   . CAD Brother    indicated that his mother is deceased. He indicated that his father is deceased. He indicated that his sister is deceased. He indicated that both of his brothers are alive.  Mother died at 34 yo. Alzheimer's. Father died at 89 yo. He fell and hurt his head. 3 brothers. 1 sister. Sister is deceased. She had diabetes.  ALLERGIES:  is allergic to reglan.  MEDICATIONS:  Current Outpatient Prescriptions  Medication Sig Dispense Refill  . dexamethasone (DECADRON) 4 MG tablet Take 2 tablets (8 mg total) by mouth 2 (two) times daily. 180 tablet 1  . dicyclomine (BENTYL) 10 MG capsule Take 10 mg by mouth 2 (two) times daily.    [name] Kitchen doxazosin (CARDURA) 2 MG tablet Take 2 mg by mouth daily.    . fluconazole (DIFLUCAN) 100 MG tablet Take 2 tablets today, then 1 tablet daily x 20 more days. 22 tablet 0  . Linaclotide (LINZESS) 145 MCG CAPS capsule 1 PO WITH BREAKFAST 30 capsule 11  . LORazepam (ATIVAN) 1 MG tablet Take 1 tablet (1 mg total) by mouth every 8 (eight) hours. 30 tablet 0  . metoprolol tartrate (LOPRESSOR) 25 MG tablet Take 1 tablet (25 mg total) by mouth 2 (two) times daily. 60 tablet 1  . mirtazapine (REMERON) 15 MG tablet Take 1 tablet (15 mg total) by mouth at bedtime. 90 tablet 3  . omeprazole (PRILOSEC) 20 MG capsule 1 PO 30 MINS PRIOR TO BREAKFAST AND AT BEDTIME 180 capsule 3  . traMADol (ULTRAM) 50 MG tablet Take 1-2 tablets (50-100 mg total) by mouth every 6 (six) hours as needed (pain). 30 tablet 0  . amLODipine (NORVASC) 5 MG tablet Take 5 mg by mouth daily.    [name] Kitchen aspirin EC 325 MG EC tablet Take 1 tablet (325 mg total) by mouth daily. (Patient not taking: Reported on 11/13/2014) 30 tablet 0  . calcium carbonate (TUMS - DOSED IN MG ELEMENTAL CALCIUM) 500 MG chewable tablet Chew 1 tablet by mouth daily as needed for indigestion or heartburn.    [name] Kitchen HYDROcodone-acetaminophen (NORCO/VICODIN) 5-325 MG per tablet Take 1 tablet by  mouth every 6 (six) hours as needed. (Patient not taking: Reported on 11/13/2014) 20 tablet 0  . Multiple Vitamin (MULTIVITAMIN) capsule Take 1 capsule by mouth daily.    [name] Kitchen nystatin (MYCOSTATIN) 100000 UNIT/ML suspension Take 5 mLs (500,000 Units total) by mouth 3 (three) times daily. X 7 days 105 mL 0  . ondansetron (ZOFRAN ODT) 4 MG disintegrating tablet Take 1 tablet (4 mg total) by mouth every 6 (six) hours as needed for nausea. 30 tablet 3  . ondansetron (ZOFRAN) 4 MG tablet     . polyethylene glycol  powder (GLYCOLAX/MIRALAX) powder Take 17 g by mouth daily as needed (for constipation).     [name] Kitchen tetrahydrozoline 0.05 % ophthalmic solution Place 1 drop into both eyes daily as needed (redness relief).     No current facility-administered medications for this visit.    Review of Systems  Constitutional: Positive for weight loss. Negative for fever, chills, malaise/fatigue and diaphoresis.  HENT: Negative for congestion, ear discharge, ear pain, hearing loss, nosebleeds, sore throat and tinnitus.   Eyes: Positive for blurred vision and double vision. Negative for photophobia and pain.       Visual changes are mostly blurry vision, with very notable double vision. Respiratory: Negative.  Negative for stridor.   Cardiovascular: Negative.   Gastrointestinal: Negative.   Genitourinary: Positive for frequency.       Nocturia , uses a urinal Musculoskeletal: Positive for joint pain. Negative for myalgias, back pain, falls and neck pain.  Skin: Negative.   Neurological: Negative for dizziness, tingling, tremors, sensory change, speech change, focal weakness, seizures, loss of consciousness, weakness and headaches.  Endo/Heme/Allergies: Negative.   Psychiatric/Behavioral: Negative for depression, suicidal ideas and memory loss. The patient is nervous/anxious. The patient does not have insomnia.   All other systems reviewed and are negative.  14 point ROS was done and is otherwise as detailed above  or in HPI   PHYSICAL EXAMINATION: ECOG PERFORMANCE STATUS: 1 - Symptomatic but completely ambulatory  Filed Vitals:   11/13/14 1003  BP: 111/74  Pulse: 69  Temp: 98 F (36.7 C)  Resp: 15   Filed Weights   11/13/14 1000  Weight: 178 lb 4.8 oz (80.876 kg)    Physical Exam  Constitutional: He is oriented to person, place, and time and well-developed, well-nourished, and in no distress.   Exam was performed while the patient remained seated in wheelchair. HENT:  Head: Normocephalic and atraumatic.  Nose: Nose normal.  Mouth/Throat: Oropharynx is clear and moist.  Eyes: Conjunctivae and EOM are normal. Patient wearing glasses, pupils are reactive Neck: Normal range of motion. Neck supple. No tracheal deviation present. No thyromegaly present.  Cardiovascular: Normal rate, regular rhythm and normal heart sounds.   No murmur heard. Pulmonary/Chest: Effort normal and breath sounds normal. No respiratory distress. He has no wheezes. He has no rales. He exhibits no tenderness.  Abdominal: Soft. Bowel sounds are normal. He exhibits no distension and no mass. There is no tenderness. There is no rebound and no guarding.  Musculoskeletal: Normal range of motion. He exhibits no edema or tenderness.  Lymphadenopathy:    He has no cervical adenopathy.  Neurological: He is alert and oriented to person, place, and time. He displays normal reflexes. No cranial nerve deficit. He exhibits normal muscle tone.  Skin: Skin is warm and dry. No rash noted. No erythema. No pallor.  Psychiatric: Mood and affect normal.  Nursing note and vitals reviewed.   LABORATORY DATA:  I have reviewed the data as listed Lab Results  Component Value Date   WBC 9.9 11/07/2014   HGB 14.4 11/07/2014   HCT 41.9 11/07/2014   MCV 70.4* 11/07/2014   PLT 80* 11/07/2014   CMP     Component Value Date/Time   NA 132* 11/08/2014 0440   K 4.5 11/08/2014 0440   CL 100* 11/08/2014 0440   CO2 26 11/08/2014 0440    GLUCOSE 115* 11/08/2014 0440   BUN 18 11/08/2014 0440   CREATININE 0.92 11/08/2014 0440   CREATININE 1.00 02/21/2014 1323   CALCIUM 8.3*  11/08/2014 0440   PROT 4.6* 11/07/2014 1023   ALBUMIN 2.8* 11/07/2014 1023   AST 21 11/07/2014 1023   ALT 35 11/07/2014 1023   ALKPHOS 36* 11/07/2014 1023   BILITOT 0.9 11/07/2014 1023   GFRNONAA >60 11/08/2014 0440   GFRAA >60 11/08/2014 0440    RADIOGRAPHIC STUDIES: I have personally reviewed the radiological images as listed and agreed with the findings in the report.  Study Result     CLINICAL DATA: Brain metastasis. SRS protocol for treatment planning.  EXAM: MRI HEAD WITHOUT AND WITH CONTRAST  TECHNIQUE: Multiplanar, multiecho pulse sequences of the brain and surrounding structures were obtained without and with intravenous contrast.  CONTRAST: 83m MULTIHANCE GADOBENATE DIMEGLUMINE 529 MG/ML IV SOLN  COMPARISON: 10/07/2014  FINDINGS: Calvarium and upper cervical spine: No focal marrow signal abnormality.  Orbits: No evidence of metastasis.  Sinuses and Mastoids: Mucosal thickening with bilateral maxillary sinus secretion retention. Mucous layers within the nasopharynx. Mastoid and middle ears are clear.  Brain: Solitary mass in the right midbrain, between the cerebral aqua duct and cerebral peduncle, 4 mm larger than previous at 16 mm diameter. There is associated increase in cerebral aqua duct mass effect, but no change ventricular volume to suggest obstructive hydrocephalus. No periventricular edema. Mild vasogenic edema around the mass is stable. No other evidence of intracranial disease.  Mild for age chronic small vessel ischemia in the bilateral cerebral white matter. Generalized cerebral volume loss. No acute infarct, acute hemorrhage, or major vessel occlusion.  IMPRESSION: 16 mm solitary metastasis in the right midbrain has grown by 4 mm since 2 weeks ago. Increased mass effect on the cerebral  aqua duct but stable ventricular volume.   Electronically Signed  By: JMonte FantasiaM.D.  On: 10/23/2014 10:37     ASSESSMENT & PLAN:  Abnormal CT imaging of the Chest History of tobacco abuse, quitting in 2014 Abnormal MRI brain with 12 mm ring-enhancing lesion Double/blurry vision Weight loss Thrombocytopenia   He has an appointment with Dr. HRoxan Hockeyon the 4th of October. He also has an appointment with Dr. SIsidore Mooson Friday, the 23rd of September.  I have recommended a referral to opthalmology to see if there is anything that can be done to help improve his vision, ie. Alteration in his glasses etc.  Today, I reviewed the nature of Stage IV NSCLC with the patient and his brothers in-depth, including his staging, molecular tumor testing, and possible options for treatment. I also provided Mr. GXiangand his brothers with reading materials about Stage IV NSCLC. We addressed end of life issues. Mr. GBarcais currently reluctant to discuss but I emphasized these issues must be addressed including DNR status.  We will take next steps once the results of his molecular testing are in. I anticipate that these results should be in next week. If the testing is negative, I will treat him with more traditional chemotherapy, his visual limitations and ongoing weight loss are a concern regarding his ability to tolerate therapy.   I discussed with the patient and family what performance status is. I discussed how important performance status is in regards to tolerance/benefit from therapy.  All questions were answered. The patient knows to call the clinic with any problems, questions or concerns.  This note was electronically signed.    This document serves as a record of services personally performed by SAncil Linsey MD. It was created on her behalf by KToni Amend a trained medical scribe. The creation of this record is  based on the scribe's personal observations and the  provider's statements to them. This document has been checked and approved by the attending provider.  I have reviewed the above documentation for accuracy and completeness, and I agree with the above. Molli Hazard, MD  11/13/2014 12:10 PM

## 2014-11-13 NOTE — Patient Instructions (Signed)
[place] at [facility] Discharge Instructions  RECOMMENDATIONS MADE BY THE CONSULTANT AND ANY TEST RESULTS WILL BE SENT TO YOUR REFERRING PHYSICIAN.  Exam and discussion by Dr. Whitney Muse. Will try to get an appointment with an ophthalmologist to see if there's anything that can be done about your vision.  Will call you with more information. Bring your medications each time you come for office visit. Keep your appointment with Dr. Lanell Persons on Friday.  Follow-up in 1 week.  Thank you for choosing [place] at [facility] to provide your oncology and hematology care.  To afford each patient quality time with our provider, please arrive at least 15 minutes before your scheduled appointment time.    You need to re-schedule your appointment should you arrive 10 or more minutes late.  We strive to give you quality time with our providers, and arriving late affects you and other patients whose appointments are after yours.  Also, if you no show three or more times for appointments you may be dismissed from the clinic at the providers discretion.     Again, thank you for choosing [facility].  Our hope is that these requests will decrease the amount of time that you wait before being seen by our physicians.       _____________________________________________________________  Should you have questions after your visit to [facility], please contact our office at [phone] between the hours of 8:30 a.m. and 4:30 p.m.  Voicemails left after 4:30 p.m. will not be returned until the following business day.  For prescription refill requests, have your pharmacy contact our office.

## 2014-11-14 ENCOUNTER — Encounter (HOSPITAL_COMMUNITY): Payer: Self-pay | Admitting: Hematology & Oncology

## 2014-11-14 DIAGNOSIS — R634 Abnormal weight loss: Secondary | ICD-10-CM | POA: Insufficient documentation

## 2014-11-14 DIAGNOSIS — H547 Unspecified visual loss: Secondary | ICD-10-CM | POA: Insufficient documentation

## 2014-11-15 ENCOUNTER — Ambulatory Visit
Admission: RE | Admit: 2014-11-15 | Discharge: 2014-11-15 | Disposition: A | Discharge status: Home / Self Care | Payer: Commercial Managed Care - HMO | Source: Ambulatory Visit | Attending: Radiation Oncology | Admitting: Radiation Oncology

## 2014-11-15 ENCOUNTER — Encounter: Payer: Self-pay | Admitting: Radiation Oncology

## 2014-11-15 VITALS — BP 123/73 | HR 71 | Temp 98.7°F | Ht 74.0 in | Wt 183.4 lb

## 2014-11-15 DIAGNOSIS — C7931 Secondary malignant neoplasm of brain: Secondary | ICD-10-CM

## 2014-11-15 MED ORDER — DEXAMETHASONE 4 MG PO TABS: ORAL_TABLET | ORAL | Status: DC

## 2014-11-15 NOTE — Progress Notes (Signed)
Radiation Oncology         [phone] ________________________________  Name: Jared Gill MRN: [id number]  Date: 11/15/2014  DOB: 1940/11/26  Follow-Up Visit Note  Outpatient  CC: Asencion Noble, MD  Patrici Ranks, MD  Diagnosis and Prior Radiotherapy:    ICD-9-CM ICD-10-CM   1. Brain metastasis 198.3 C79.31 dexamethasone (DECADRON) 4 MG tablet   Stage IV NSCLC Brain Metastases Stereotactic radiosurgery on 10/25/2014, 10/30/2014, 11/01/2014  Narrative:  The patient returns today for routine follow-up appointment with radiation oncology. He denies symptoms of pain. Dr. Whitney Muse, MD communicated that referral to an eye doctor is made. He reports that his concerning symptom of worsening vision persists. "I feel like a veil across my head," the patient stated in regards to his scalp sensation. There is no weakness on one side of the body reported, no HA, nor nausea or seizures. He is still administering the prescribed medication Dexamethasone as 2 tablets twice a day. Ambulatory status is wheel-chair bound.    ALLERGIES:  is allergic to reglan.  Meds: Current Outpatient Prescriptions  Medication Sig Dispense Refill  . amLODipine (NORVASC) 5 MG tablet Take 5 mg by mouth daily.    . calcium carbonate (TUMS - DOSED IN MG ELEMENTAL CALCIUM) 500 MG chewable tablet Chew 1 tablet by mouth daily as needed for indigestion or heartburn.    . dexamethasone (DECADRON) 4 MG tablet Start taking 1 tablet ('4mg'$ ) twice daily with food. On Oct 7, continue to taper as instructed. 120 tablet 0  . fluconazole (DIFLUCAN) 100 MG tablet Take 2 tablets today, then 1 tablet daily x 20 more days. 22 tablet 0  . metoprolol tartrate (LOPRESSOR) 25 MG tablet Take 1 tablet (25 mg total) by mouth 2 (two) times daily. 60 tablet 1  . mirtazapine (REMERON) 15 MG tablet Take 1 tablet (15 mg total) by mouth at bedtime. 90 tablet 3  . nystatin (MYCOSTATIN) 100000 UNIT/ML suspension Take 5 mLs (500,000 Units total) by mouth  3 (three) times daily. X 7 days 105 mL 0  . omeprazole (PRILOSEC) 20 MG capsule 1 PO 30 MINS PRIOR TO BREAKFAST AND AT BEDTIME 180 capsule 3  . ondansetron (ZOFRAN ODT) 4 MG disintegrating tablet Take 1 tablet (4 mg total) by mouth every 6 (six) hours as needed for nausea. 30 tablet 3  . polyethylene glycol powder (GLYCOLAX/MIRALAX) powder Take 17 g by mouth daily as needed (for constipation).     [name] Kitchen tetrahydrozoline 0.05 % ophthalmic solution Place 1 drop into both eyes daily as needed (redness relief).    [name] Kitchen aspirin EC 325 MG EC tablet Take 1 tablet (325 mg total) by mouth daily. (Patient not taking: Reported on 11/13/2014) 30 tablet 0  . dicyclomine (BENTYL) 10 MG capsule Take 10 mg by mouth 2 (two) times daily.    [name] Kitchen doxazosin (CARDURA) 2 MG tablet Take 2 mg by mouth daily.    [name] Kitchen HYDROcodone-acetaminophen (NORCO/VICODIN) 5-325 MG per tablet Take 1 tablet by mouth every 6 (six) hours as needed. (Patient not taking: Reported on 11/13/2014) 20 tablet 0  . Linaclotide (LINZESS) 145 MCG CAPS capsule 1 PO WITH BREAKFAST 30 capsule 11  . LORazepam (ATIVAN) 1 MG tablet Take 1 tablet (1 mg total) by mouth every 8 (eight) hours. (Patient not taking: Reported on 11/15/2014) 30 tablet 0  . Multiple Vitamin (MULTIVITAMIN) capsule Take 1 capsule by mouth daily.    . traMADol (ULTRAM) 50 MG tablet Take 1-2 tablets (50-100 mg total) by mouth every 6 (  six) hours as needed (pain). (Patient not taking: Reported on 11/15/2014) 30 tablet 0   No current facility-administered medications for this encounter.    Physical Findings: The patient is in no acute distress.     height is '6\' 2"'$  (1.88 m) and weight is 183 lb 6.4 oz (83.19 kg). His temperature is 98.7 F (37.1 C). His blood pressure is 123/73 and his pulse is 71. His oxygen saturation is 98%.   No oral thrush Subjectivley vision is blurry but he can see how many fingers I hold up 4 out of 5 strength symmetrically on hip flexion Strength is intact and symmetric in  arms He does not follow instructions to determine if EOMI    Lab Findings: Lab Results  Component Value Date   WBC 9.9 11/07/2014   HGB 14.4 11/07/2014   HCT 41.9 11/07/2014   MCV 70.4* 11/07/2014   PLT 80* 11/07/2014    Radiographic Findings: Dg Chest 1 View  10/29/2014   CLINICAL DATA:  Attempted lung biopsy  EXAM: CHEST  1 VIEW  COMPARISON:  PET-CT of 10/21/2014 and CT chest of 10/10/2014  FINDINGS: The right upper paramediastinal soft tissue mass noted by CT is not well seen by chest x-ray. No pneumothorax is noted. No effusion is seen. Heart size is stable.  IMPRESSION: No pneumothorax. The right upper paramediastinal soft tissue mass is not well seen by chest x-ray.   Electronically Signed   By: Ivar Drape M.D.   On: 10/29/2014 13:28   Dg Chest 2 View  11/07/2014   CLINICAL DATA:  History of lung carcinoma  EXAM: CHEST  2 VIEW  COMPARISON:  Chest x-ray of 10/29/2014, PET-CT of 10/21/2014, and CT chest of 10/10/2014  FINDINGS: The prominent right paratracheal soft tissue noted previously by chest x-ray appears to have diminished in size in the interval. No infiltrate or effusion is seen. Probable nipple shadows are noted at the lung bases. No pleural effusion is seen. Heart size is stable. There are diffuse degenerative changes throughout the mid to lower thoracic spine.  IMPRESSION: 1. Apparent decrease in size of previously demonstrated right paratracheal soft tissue mass. 2. No infiltrate or effusion. Apparent nipple shadows at the lung bases.   Electronically Signed   By: Ivar Drape M.D.   On: 11/07/2014 11:27   Mr Jeri Cos ZE Contrast  10/23/2014   CLINICAL DATA:  Brain metastasis. SRS protocol for treatment planning.  EXAM: MRI HEAD WITHOUT AND WITH CONTRAST  TECHNIQUE: Multiplanar, multiecho pulse sequences of the brain and surrounding structures were obtained without and with intravenous contrast.  CONTRAST:  44m MULTIHANCE GADOBENATE DIMEGLUMINE 529 MG/ML IV SOLN  COMPARISON:   10/07/2014  FINDINGS: Calvarium and upper cervical spine: No focal marrow signal abnormality.  Orbits: No evidence of metastasis.  Sinuses and Mastoids: Mucosal thickening with bilateral maxillary sinus secretion retention. Mucous layers within the nasopharynx. Mastoid and middle ears are clear.  Brain: Solitary mass in the right midbrain, between the cerebral aqua duct and cerebral peduncle, 4 mm larger than previous at 16 mm diameter. There is associated increase in cerebral aqua duct mass effect, but no change ventricular volume to suggest obstructive hydrocephalus. No periventricular edema. Mild vasogenic edema around the mass is stable. No other evidence of intracranial disease.  Mild for age chronic small vessel ischemia in the bilateral cerebral white matter. Generalized cerebral volume loss. No acute infarct, acute hemorrhage, or major vessel occlusion.  IMPRESSION: 16 mm solitary metastasis in the right midbrain  has grown by 4 mm since 2 weeks ago. Increased mass effect on the cerebral aqua duct but stable ventricular volume.   Electronically Signed   By: Monte Fantasia M.D.   On: 10/23/2014 10:37   Nm Pet Image Initial (pi) Skull Base To Thigh  10/21/2014   CLINICAL DATA:  Initial treatment strategy for right lung mass with brain metastases.  EXAM: NUCLEAR MEDICINE PET SKULL BASE TO THIGH  TECHNIQUE: 10.3 mCi F-18 FDG was injected intravenously. Full-ring PET imaging was performed from the skull base to thigh after the radiotracer. CT data was obtained and used for attenuation correction and anatomic localization.  FASTING BLOOD GLUCOSE:  Value: 108 mg/dl  COMPARISON:  CT chest abdomen pelvis dated 10/10/2014.  FINDINGS: NECK  No hypermetabolic lymph nodes in the neck.  CHEST  3.3 x 2.8 cm medial right upper lobe mass (series 4/ image 53), max SUV 11.9.  Two central right upper lobe nodules measuring 1.3 and 2.0 cm (series 6/ image 23), max SUV 9.9.  Nodularity along the pleural fat with focal  hypermetabolism overlying the right 10th-11th rib interspace, max SUV 6.5.  Associated thoracic lymphadenopathy, including:  --6 mm short axis right supraclavicular node (series 4/ image 47), max SUV 5.3  --13 mm short axis right paratracheal node (series 4/ image 66), max SUV 19.1  --12 mm short axis right hilar node (series 4/ image 36), max SUV 13.0  ABDOMEN/PELVIS  No abnormal hypermetabolic activity within the liver, pancreas, adrenal glands, or spleen.  Status post cholecystectomy. Atherosclerotic calcifications of the abdominal aorta and branch vessels.  No hypermetabolic lymph nodes in the abdomen or pelvis.  SKELETON  Focal hypermetabolism involving the anterior column of the right acetabulum, max SUV 5.7, although without CT correlate.  Focal hypermetabolism involving the left anterior 4th rib, max SUV 5.8.  IMPRESSION: 3.3 cm medial right upper lobe mass with two central right upper lobe nodules measuring up to 2.0 cm, suspicious for primary bronchogenic neoplasm with a pancoast tumor.  Associated right supraclavicular, mediastinal, and right hilar nodal metastases.  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   Electronically Signed   By: Julian Hy M.D.   On: 10/21/2014 16:20    Impression/Plan: Healthy methods of management in regards to vocalized symptoms were addressed.   He is aware of his appointment with Dr. Whitney Muse, MD to take place next Thursday (11/21/2014) in order to discuss chemotherapy options, moving forward. The patient is instructed to attend an appointment with eye doctor to address his worsening and concerning symptom. This referral has been made by Dr Whitney Muse and the importance of attending this appointment was emphasized. The patient understands the benefits, purpose, and proper administration of the prescribed medication Dexamethasone. A prescription was provided today with the instructions to taper his daily  doseswith food. Detailed instructions were provided and sent home with the patient oh how to safely taper his medication. It was vocalized that the prescription refill will be sent to [facility]. MRI is to be complete in approximatley two months time. He is advised of his follow-up appointment to take place with radiation oncology as scheduled, post MRI. The patient is aware that he will follow-up with Dr. Whitney Muse, MD moving forward throughout his chemotherapy treatments. The patient understands that he can access his appointments and medical records via [place]. All vocalized questions and concerns have been addressed. If the patient develops any further questions or concerns in regards to  his treatment and recovery, he has been encouraged to contact Dr. Isidore Moos, MD.   To Taper Dexamethasone, Start taking 1 tablet ('4mg'$ ) twice daily with food. On Oct 7, start taking 1 tablet in the morning, only, with food. On Oct 21, start taking 1/2 tablet in the morning, with food. On Nov 4th, start taking 1/2 tablet in the morning, every other day. On Nov 18, stop taking Dexamethasone.     This document serves as a record of services personally performed by Eppie Gibson, MD. It was created on her behalf by Lenn Cal, a trained medical scribe. The creation of this record is based on the scribe's personal observations and the provider's statements to them. This document has been checked and approved by the attending provider. _____________________________________   Eppie Gibson, MD

## 2014-11-15 NOTE — Progress Notes (Addendum)
Jared Gill here for reassessment.  He denies any pain, nor SOB Travel by wheelchair.  Appears fatigued.     BP 123/73 mmHg  Pulse 71  Temp(Src) 98.7 F (37.1 C)  Ht '6\' 2"'$  (1.88 m)  Wt 183 lb 6.4 oz (83.19 kg)  BMI 23.54 kg/m2  SpO2 98%   Wt Readings from Last 3 Encounters:  11/15/14 183 lb 6.4 oz (83.19 kg)  11/13/14 178 lb 4.8 oz (80.876 kg)  11/07/14 187 lb (84.823 kg)

## 2014-11-15 NOTE — Patient Instructions (Signed)
To Taper Dexamethasone, Start taking 1 tablet ('4mg'$ ) twice daily with food. On Oct 7, start taking 1 tablet in the morning, only, with food. On Oct 21, start taking 1/2 tablet in the morning, with food. On Nov 4th, start taking 1/2 tablet in the morning, every other day. On Nov 18, stop taking Dexamethasone.

## 2014-11-21 ENCOUNTER — Encounter (HOSPITAL_BASED_OUTPATIENT_CLINIC_OR_DEPARTMENT_OTHER): Payer: Commercial Managed Care - HMO | Admitting: Hematology & Oncology

## 2014-11-21 ENCOUNTER — Encounter (HOSPITAL_COMMUNITY): Payer: Self-pay | Admitting: Hematology & Oncology

## 2014-11-21 VITALS — BP 129/62 | HR 84 | Temp 97.8°F | Resp 18 | Wt 176.5 lb

## 2014-11-21 DIAGNOSIS — C3410 Malignant neoplasm of upper lobe, unspecified bronchus or lung: Secondary | ICD-10-CM | POA: Diagnosis not present

## 2014-11-21 NOTE — Patient Instructions (Addendum)
[place] at [facility] Discharge Instructions  RECOMMENDATIONS MADE BY THE CONSULTANT AND ANY TEST RESULTS WILL BE SENT TO YOUR REFERRING PHYSICIAN.  Exam completed by Dr Whitney Muse today Make sure all of diflucan is completed Please call us if sore throat persists.   Return to see the doctor in 2 weeks.  Please call the clinic if you have any questions or concerns.  Thank you for choosing [place] at [facility] to provide your oncology and hematology care.  To afford each patient quality time with our provider, please arrive at least 15 minutes before your scheduled appointment time.    You need to re-schedule your appointment should you arrive 10 or more minutes late.  We strive to give you quality time with our providers, and arriving late affects you and other patients whose appointments are after yours.  Also, if you no show three or more times for appointments you may be dismissed from the clinic at the providers discretion.     Again, thank you for choosing [facility].  Our hope is that these requests will decrease the amount of time that you wait before being seen by our physicians.       _____________________________________________________________  Should you have questions after your visit to [facility], please contact our office at [phone] between the hours of 8:30 a.m. and 4:30 p.m.  Voicemails left after 4:30 p.m. will not be returned until the following business day.  For prescription refill requests, have your pharmacy contact our office.

## 2014-11-21 NOTE — Progress Notes (Signed)
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 male is here for follow-up of stage IV NSCLC, adenocarcinoma.  He has completed XRT with Dr. Isidore Moos. Final pathology is c/w adenocarcinoma, Foundation One testing is sent and pending.   The patient has been to the Opthalmologist.  He passed his eye exam.  He was told his tumor was effecting his eyesight.  He reports that he cannot see peripherally,  "feeling like someone puts a shade on the sides" of his eyes.  He reports his eyes feel like they have pressure.  He reports his eyes are blurry.  His brother notes that he can see to walk and shaves normal.  He reports of a sore throat that began about a week ago.  He is still taking his steroid medication, 2 per day. He has a written schedule to continue to taper off his dexamethasone. His brother admits that he is having trouble keeping up with his medication regimen.  He states that he is not hungry but still eats.    MEDICAL HISTORY:  Past Medical History  Diagnosis Date  . Hypertension   . Abnormal MRI, spine 05/12/2011  . Nausea 01/2014  . BPH (benign prostatic hypertrophy)   . Hx of adenomatous colonic polyps     h/o tubulovillous adenomas  . Diverticulitis   . Brain metastasis ([place])   . Metastasis to brain ([place])   . Lung mass     Right Upper Lobe  . GERD (gastroesophageal reflux disease)   . Blurred vision   . Brain metastases [facility])     SURGICAL HISTORY: Past Surgical History  Procedure Laterality Date  . Cholecystectomy  2009  . Polyp removal via colonoscopy    . Colonoscopy with esophagogastroduodenoscopy (egd) N/A 04/06/2013    [id number] size internal hemorrhoids/moderate  diverticulosis/medium sized HH  . Tonsilectomy, adenoidectomy, bilateral myringotomy and tubes    . Video bronchoscopy with endobronchial ultrasound N/A 10/29/2014    Procedure: ATTEMPTED VIDEO BRONCHOSCOPY WITH ENDOBRONCHIAL ULTRASOUND;  Surgeon: Javier Glazier, MD;  Location: [place];  Service: Thoracic;  Laterality: N/A;  . Mediastinoscopy N/A 11/07/2014    Procedure: MEDIASTINOSCOPY;  Surgeon: Melrose Nakayama, MD;  Location: [place];  Service: Thoracic;  Laterality: N/A;    SOCIAL HISTORY: Social History   Social History  . Marital Status: Single    Spouse Name: N/A  . Number of Children: 0  . Years of Education: N/A   Occupational History  . retired    Social History Main Topics  . Smoking status: Former Smoker -- 0.50 packs/day for 40 years    Types: Cigarettes    Quit date: 10/17/2009  . Smokeless tobacco: Never Used  . Alcohol Use: No     Comment: Remote EtOH  . Drug Use: No  . Sexual Activity: Not on file   Other Topics Concern  . Not on file   Social History Narrative   Originally from [place]. Previously worked as a Training and development officer at [employer]. He worked in the [employer]. He previously worked Audiological scientist for [employer] Has no pets currently. No mold exposure. No asbestos exposure.   Single, never married. No children. Lives in [address]. Ex smoker, began at  an early age. Quit for over 2 years. ETOH, previously drank "not a whole lot". He worked at [employer] for 35 years.  FAMILY HISTORY: Family History  Problem Relation Age of Onset  . Diabetes Father   . Diabetes Sister   . Lung cancer Brother   . CVA Brother   . Colon cancer Neg Hx   . CAD Brother    indicated that his mother is deceased. He indicated that his father is deceased. He indicated that his sister is deceased. He indicated that both of his brothers are alive.  Mother died at 65 yo. Alzheimer's. Father died at 64 yo. He fell and hurt his head. 3 brothers. 1 sister. Sister  is deceased. She had diabetes.  ALLERGIES:  is allergic to reglan.  MEDICATIONS:  Current Outpatient Prescriptions  Medication Sig Dispense Refill  . amLODipine (NORVASC) 5 MG tablet Take 5 mg by mouth daily.    . calcium carbonate (TUMS - DOSED IN MG ELEMENTAL CALCIUM) 500 MG chewable tablet Chew 1 tablet by mouth daily as needed for indigestion or heartburn.    . dexamethasone (DECADRON) 4 MG tablet Start taking 1 tablet ('4mg'$ ) twice daily with food. On Oct 7, continue to taper as instructed. 120 tablet 0  . dicyclomine (BENTYL) 10 MG capsule Take 10 mg by mouth 2 (two) times daily.    [name] Kitchen doxazosin (CARDURA) 2 MG tablet Take 2 mg by mouth daily.    [name] Kitchen HYDROcodone-acetaminophen (NORCO/VICODIN) 5-325 MG per tablet Take 1 tablet by mouth every 6 (six) hours as needed. 20 tablet 0  . Linaclotide (LINZESS) 145 MCG CAPS capsule 1 PO WITH BREAKFAST 30 capsule 11  . metoprolol tartrate (LOPRESSOR) 25 MG tablet Take 1 tablet (25 mg total) by mouth 2 (two) times daily. 60 tablet 1  . mirtazapine (REMERON) 15 MG tablet Take 1 tablet (15 mg total) by mouth at bedtime. 90 tablet 3  . Multiple Vitamin (MULTIVITAMIN) capsule Take 1 capsule by mouth daily.    [name] Kitchen nystatin (MYCOSTATIN) 100000 UNIT/ML suspension Take 5 mLs (500,000 Units total) by mouth 3 (three) times daily. X 7 days 105 mL 0  . omeprazole (PRILOSEC) 20 MG capsule 1 PO 30 MINS PRIOR TO BREAKFAST AND AT BEDTIME 180 capsule 3  . ondansetron (ZOFRAN ODT) 4 MG disintegrating tablet Take 1 tablet (4 mg total) by mouth every 6 (six) hours as needed for nausea. 30 tablet 3  . polyethylene glycol powder (GLYCOLAX/MIRALAX) powder Take 17 g by mouth daily as needed (for constipation).     [name] Kitchen tetrahydrozoline 0.05 % ophthalmic solution Place 1 drop into both eyes daily as needed (redness relief).    [name] Kitchen aspirin EC 325 MG EC tablet Take 1 tablet (325 mg total) by mouth daily. (Patient not taking: Reported on 11/13/2014) 30 tablet 0  . LORazepam (ATIVAN) 1 MG  tablet Take 1 tablet (1 mg total) by mouth every 8 (eight) hours. (Patient not taking: Reported on 11/15/2014) 30 tablet 0  . traMADol (ULTRAM) 50 MG tablet Take 1-2 tablets (50-100 mg total) by mouth every 6 (six) hours as needed (pain). (Patient not taking: Reported on 11/15/2014) 30 tablet 0   No current facility-administered medications for this visit.    Review of Systems  Constitutional: Positive for weight loss. Negative for fever, chills, malaise/fatigue and diaphoresis.  HENT: Negative for congestion, ear discharge, ear pain, hearing loss, nosebleeds, sore throat and tinnitus.   Eyes: Positive for blurred vision and double vision. Negative for photophobia and  pain.       Visual changes are mostly blurry vision, with very notable double vision. Respiratory: Negative.  Negative for stridor.   Cardiovascular: Negative.   Gastrointestinal: Negative.   Genitourinary: Positive for frequency.       Nocturia , uses a urinal Musculoskeletal: Positive for joint pain. Negative for myalgias, back pain, falls and neck pain.  Skin: Negative.   Neurological: Negative for dizziness, tingling, tremors, sensory change, speech change, focal weakness, seizures, loss of consciousness, weakness and headaches.  Endo/Heme/Allergies: Negative.   Psychiatric/Behavioral: Negative for depression, suicidal ideas and memory loss. The patient is nervous/anxious. The patient does not have insomnia.   All other systems reviewed and are negative.  14 point ROS was done and is otherwise as detailed above or in HPI  PHYSICAL EXAMINATION: ECOG PERFORMANCE STATUS: 1 - Symptomatic but completely ambulatory  Filed Vitals:   11/21/14 1300  BP: 129/62  Pulse: 84  Temp: 97.8 F (36.6 C)  Resp: 18   Filed Weights   11/21/14 1300  Weight: 176 lb 8 oz (80.06 kg)    Physical Exam  Constitutional: He is oriented to person, place, and time and well-developed, well-nourished, and in no distress.  Head: Normocephalic  and atraumatic.  Nose: Nose normal.  Mouth/Throat: Oropharynx is clear and moist. Eyes: Conjunctivae and EOM are normal. Patient wearing glasses, pupils are reactive Neck: Normal range of motion. Neck supple. No tracheal deviation present. No thyromegaly present.  Cardiovascular: Normal rate, regular rhythm and normal heart sounds.   No murmur heard. Pulmonary/Chest: Effort normal and breath sounds normal. No respiratory distress. He has no wheezes. He has no rales. He exhibits no tenderness.  Abdominal: Soft. Bowel sounds are normal. He exhibits no distension and no mass. There is no tenderness. There is no rebound and no guarding.  Musculoskeletal: Normal range of motion. He exhibits no edema or tenderness.  Lymphadenopathy:    He has no cervical adenopathy.  Neurological: He is alert and oriented to person, place, and time. He displays normal reflexes. No cranial nerve deficit. He exhibits normal muscle tone.  Skin: Skin is warm and dry. No rash noted. No erythema. No pallor.  Psychiatric: Mood and affect normal.  Nursing note and vitals reviewed.   LABORATORY DATA:  I have reviewed the data as listed Lab Results  Component Value Date   WBC 9.9 11/07/2014   HGB 14.4 11/07/2014   HCT 41.9 11/07/2014   MCV 70.4* 11/07/2014   PLT 80* 11/07/2014   CMP     Component Value Date/Time   NA 132* 11/08/2014 0440   K 4.5 11/08/2014 0440   CL 100* 11/08/2014 0440   CO2 26 11/08/2014 0440   GLUCOSE 115* 11/08/2014 0440   BUN 18 11/08/2014 0440   CREATININE 0.92 11/08/2014 0440   CREATININE 1.00 02/21/2014 1323   CALCIUM 8.3* 11/08/2014 0440   PROT 4.6* 11/07/2014 1023   ALBUMIN 2.8* 11/07/2014 1023   AST 21 11/07/2014 1023   ALT 35 11/07/2014 1023   ALKPHOS 36* 11/07/2014 1023   BILITOT 0.9 11/07/2014 1023   GFRNONAA >60 11/08/2014 0440   GFRAA >60 11/08/2014 0440    RADIOGRAPHIC STUDIES: I have personally reviewed the radiological images as listed and agreed with the  findings in the report.  Study Result     CLINICAL DATA: Brain metastasis. SRS protocol for treatment planning.  EXAM: MRI HEAD WITHOUT AND WITH CONTRAST  TECHNIQUE: Multiplanar, multiecho pulse sequences of the brain and surrounding structures were  obtained without and with intravenous contrast.  CONTRAST: 72m MULTIHANCE GADOBENATE DIMEGLUMINE 529 MG/ML IV SOLN  COMPARISON: 10/07/2014  FINDINGS: Calvarium and upper cervical spine: No focal marrow signal abnormality.  Orbits: No evidence of metastasis.  Sinuses and Mastoids: Mucosal thickening with bilateral maxillary sinus secretion retention. Mucous layers within the nasopharynx. Mastoid and middle ears are clear.  Brain: Solitary mass in the right midbrain, between the cerebral aqua duct and cerebral peduncle, 4 mm larger than previous at 16 mm diameter. There is associated increase in cerebral aqua duct mass effect, but no change ventricular volume to suggest obstructive hydrocephalus. No periventricular edema. Mild vasogenic edema around the mass is stable. No other evidence of intracranial disease.  Mild for age chronic small vessel ischemia in the bilateral cerebral white matter. Generalized cerebral volume loss. No acute infarct, acute hemorrhage, or major vessel occlusion.  IMPRESSION: 16 mm solitary metastasis in the right midbrain has grown by 4 mm since 2 weeks ago. Increased mass effect on the cerebral aqua duct but stable ventricular volume.   Electronically Signed  By: JMonte FantasiaM.D.  On: 10/23/2014 10:37     ASSESSMENT & PLAN:  Stage IV adenocarcinoma of the lung Brain Metastases, s/p Stereotactic radiosurgery on 10/25/2014, 10/30/2014, 11/01/2014 History of tobacco abuse, quitting in 2014 Double/blurry vision Weight loss Thrombocytopenia   I have advised the patient we are still waiting on Foundation One testing. Results should be available early next week. He is still  interested in pursuing therapy.   He has seen an eye doctor and was told his vision was ok. I unfortunately know nothing further to do in regards to his visual limitation.   We addressed end of life issues. Mr. GNhanis currently reluctant to discuss but I emphasized these issues must be addressed including DNR status.  If the testing is negative, I will treat him with more traditional chemotherapy, his visual limitations and ongoing weight loss are a concern regarding his ability to tolerate platinum doublet therapy.   I discussed with the patient and family what performance status is. I discussed how important performance status is in regards to tolerance/benefit from therapy.  All questions were answered. The patient knows to call the clinic with any problems, questions or concerns.   This note was electronically signed.    This document serves as a record of services personally performed by SAncil Linsey MD. It was created on her behalf by DJanace Hoard a trained medical scribe. The creation of this record is based on the scribe's personal observations and the provider's statements to them. This document has been checked and approved by the attending provider.  I have reviewed the above documentation for accuracy and completeness, and I agree with the above.  SKelby Fam PWhitney Muse MD

## 2014-11-25 ENCOUNTER — Encounter (HOSPITAL_COMMUNITY): Payer: Self-pay | Admitting: Hematology & Oncology

## 2014-11-25 ENCOUNTER — Ambulatory Visit (INDEPENDENT_AMBULATORY_CARE_PROVIDER_SITE_OTHER): Payer: Commercial Managed Care - HMO | Admitting: Physician Assistant

## 2014-11-25 ENCOUNTER — Encounter: Payer: Self-pay | Admitting: Physician Assistant

## 2014-11-25 VITALS — BP 100/62 | HR 79 | Ht 71.0 in | Wt 175.0 lb

## 2014-11-25 DIAGNOSIS — I10 Essential (primary) hypertension: Secondary | ICD-10-CM

## 2014-11-25 DIAGNOSIS — C3490 Malignant neoplasm of unspecified part of unspecified bronchus or lung: Secondary | ICD-10-CM

## 2014-11-25 DIAGNOSIS — I480 Paroxysmal atrial fibrillation: Secondary | ICD-10-CM | POA: Diagnosis not present

## 2014-11-25 NOTE — Assessment & Plan Note (Signed)
Being followed by oncology in [place] for further treatment.

## 2014-11-25 NOTE — Patient Instructions (Signed)
Medication Instructions:  1) STOP Amlodipine 2) CONTINUE Taking Metoprolol  Labwork: None  Testing/Procedures: None  Follow-Up: Your physician recommends that you schedule a follow-up appointment in: 3-4 months with Dr. Harl Bowie in our [place] office. [address], [place]   Any Other Special Instructions Will Be Listed Below (If Applicable).

## 2014-11-25 NOTE — Assessment & Plan Note (Signed)
Patient is in normal sinus rhythm today. He had atrial fibrillation with RVR after lung biopsy. No anticoagulation given because of biopsy and brain metastasis. Chads2vasc score equals 2. 2-D echo shows normal LV function with moderate LVH. EF 60%. Patient lives in [place]. We'll have him follow-up there with Dr. Harl Bowie in 3-4 months.

## 2014-11-25 NOTE — Progress Notes (Signed)
Cardiology Office Note   Date:  11/25/2014   ID:  Jared Gill, DOB Jun 27, 1940, MRN [id number]  PCP:  Asencion Noble, MD  Cardiologist:  New-will make appt with Dr. Harl Bowie in [place]  Chief Complaint: Visual disturbances    History of Present Illness: Jared Gill is a 74 y.o. male who presents for post hospital follow-up. He has metastatic lung cancer with isolated brain metastasis. He had a biopsy revealing non-small cell carcinoma and while in the PACU developed atrial fibrillation with RVR. CHADS2VASC is 2 for HTN,age. Given his biopsy and brain metastases anticoagulation was not recommended. He was started on metoprolol 25 mg twice a day. 2-D echo showed normal LV function with LVH EF 60%.  Patient comes in today accompanied by his son. His main complaint is visual changes and inability to see because of his  brain tumor. He denies chest pain, palpitations, dizziness or presyncope. His blood pressure is quite low. He is in sinus rhythm with a sinus arrhythmia.    Past Medical History  Diagnosis Date  . Hypertension   . Abnormal MRI, spine 05/12/2011  . Nausea 01/2014  . BPH (benign prostatic hypertrophy)   . Hx of adenomatous colonic polyps     h/o tubulovillous adenomas  . Diverticulitis   . Brain metastasis ([place])   . Metastasis to brain ([place])   . Lung mass     Right Upper Lobe  . GERD (gastroesophageal reflux disease)   . Blurred vision   . Brain metastases [facility])     Past Surgical History  Procedure Laterality Date  . Cholecystectomy  2009  . Polyp removal via colonoscopy    . Colonoscopy with esophagogastroduodenoscopy (egd) N/A 04/06/2013    [id number] size internal hemorrhoids/moderate diverticulosis/medium sized HH  . Tonsilectomy, adenoidectomy, bilateral myringotomy and tubes    . Video bronchoscopy with endobronchial ultrasound N/A 10/29/2014    Procedure: ATTEMPTED VIDEO BRONCHOSCOPY WITH ENDOBRONCHIAL ULTRASOUND;  Surgeon: Javier Glazier, MD;  Location: [place];  Service: Thoracic;  Laterality: N/A;  . Mediastinoscopy N/A 11/07/2014    Procedure: MEDIASTINOSCOPY;  Surgeon: Melrose Nakayama, MD;  Location: [place];  Service: Thoracic;  Laterality: N/A;     Current Outpatient Prescriptions  Medication Sig Dispense Refill  . amLODipine (NORVASC) 5 MG tablet Take 5 mg by mouth daily.    [name] Kitchen dexamethasone (DECADRON) 4 MG tablet Start taking 1 tablet ('4mg'$ ) twice daily with food. On Oct 7, continue to taper as instructed. 120 tablet 0  . metoprolol tartrate (LOPRESSOR) 25 MG tablet Take 1 tablet (25 mg total) by mouth 2 (two) times daily. 60 tablet 1  . omeprazole (PRILOSEC) 20 MG capsule 1 PO 30 MINS PRIOR TO BREAKFAST AND AT BEDTIME 180 capsule 3  . ondansetron (ZOFRAN ODT) 4 MG disintegrating tablet Take 1 tablet (4 mg total) by mouth every 6 (six) hours as needed for nausea. 30 tablet 3  . nystatin (MYCOSTATIN) 100000 UNIT/ML suspension Take 5 mLs (500,000 Units total) by mouth 3 (three) times daily. X 7 days (Patient not taking: Reported on 11/25/2014) 105 mL 0   No current facility-administered medications for this visit.    Allergies:   Reglan    Social History:  The patient  reports that he quit smoking about 5 years ago. His smoking use included Cigarettes. He has a 20 pack-year smoking history. He has never used smokeless tobacco. He reports that he does not drink alcohol or use illicit drugs.   Family History:  The  patient's    family history includes CAD in his brother; CVA in his brother; Diabetes in his father and sister; Hypertension in his brother; Lung cancer in his brother; Stroke in his brother. There is no history of Colon cancer or Heart attack.    ROS:  Please see the history of present illness.   Otherwise, review of systems are positive for wears glasses, in a wheelchair, weak.   All other systems are reviewed and negative.    PHYSICAL EXAM: VS:  BP 100/62 mmHg  Pulse 79  Ht '5\' 11"'$  (1.803 m)  Wt 175 lb (79.379 kg)  BMI  24.42 kg/m2 , BMI Body mass index is 24.42 kg/(m^2). GEN: Well nourished, well developed, in no acute distress Neck: no JVD, HJR, carotid bruits, or masses Cardiac: RRR; no murmurs,gallop, rubs, thrill or heave,  Respiratory:  clear to auscultation bilaterally, normal work of breathing GI: soft, nontender, nondistended, + BS MS: no deformity or atrophy Extremities: without cyanosis, clubbing, edema, good distal pulses bilaterally.  Skin: warm and dry, no rash Neuro:  Strength and sensation are intact    EKG:  EKG is ordered today. The ekg ordered today demonstrates normal sinus rhythm with sinus arrhythmia   Recent Labs: 02/21/2014: TSH 1.201 11/07/2014: ALT 35; Hemoglobin 14.4; Platelets 80* 11/08/2014: BUN 18; Creatinine, Ser 0.92; Potassium 4.5; Sodium 132*    Lipid Panel No results found for: CHOL, TRIG, HDL, CHOLHDL, VLDL, LDLCALC, LDLDIRECT    Wt Readings from Last 3 Encounters:  11/25/14 175 lb (79.379 kg)  11/21/14 176 lb 8 oz (80.06 kg)  11/15/14 183 lb 6.4 oz (83.19 kg)      Other studies Reviewed: Additional studies/ records that were reviewed today include and review of the records demonstrates: 2-D echo 10/10/14 Study Conclusions  - Left ventricle: The cavity size was normal. Wall thickness was   increased in a pattern of moderate LVH. The estimated ejection   fraction was 60%. Regional wall motion abnormalities cannot be   excluded. - Left atrium: The atrium was mildly dilated. - Right ventricle: The cavity size was mildly dilated. Systolic   function was normal.  ASSESSMENT AND PLAN: Atrial fibrillation Patient is in normal sinus rhythm today. He had atrial fibrillation with RVR after lung biopsy. No anticoagulation given because of biopsy and brain metastasis. Chads2vasc score equals 2. 2-D echo shows normal LV function with moderate LVH. EF 60%. Patient lives in [place]. We'll have him follow-up there with Dr. Harl Bowie in 3-4 months.  Essential  hypertension Patient's blood pressure is low. We'll stop amlodipine.  Non-small cell carcinoma of lung Being followed by oncology in [place] for further treatment.     Jared Boast, PA-C  11/25/2014 11:26 AM    [facility] [address] [place], [place], [place]  [address] Phone: [phone]; Fax: [phone]

## 2014-11-25 NOTE — Assessment & Plan Note (Signed)
Patient's blood pressure is low. We'll stop amlodipine.

## 2014-11-26 ENCOUNTER — Encounter: Payer: Self-pay | Admitting: Thoracic Surgery (Cardiothoracic Vascular Surgery)

## 2014-11-26 ENCOUNTER — Encounter (HOSPITAL_COMMUNITY): Payer: Self-pay

## 2014-11-26 ENCOUNTER — Ambulatory Visit (INDEPENDENT_AMBULATORY_CARE_PROVIDER_SITE_OTHER): Payer: Commercial Managed Care - HMO | Admitting: Thoracic Surgery (Cardiothoracic Vascular Surgery)

## 2014-11-26 VITALS — BP 103/63 | HR 69 | Resp 16 | Ht 71.0 in | Wt 175.0 lb

## 2014-11-26 DIAGNOSIS — C799 Secondary malignant neoplasm of unspecified site: Secondary | ICD-10-CM | POA: Diagnosis not present

## 2014-11-26 DIAGNOSIS — C7931 Secondary malignant neoplasm of brain: Secondary | ICD-10-CM

## 2014-11-26 DIAGNOSIS — R918 Other nonspecific abnormal finding of lung field: Secondary | ICD-10-CM

## 2014-11-26 NOTE — Progress Notes (Signed)
      [address]       [place] [address]             [phone]       HPI:  Jared Gill turns for a scheduled postop visit.  He is a 74 year old man with metastatic lung cancer had a mediastinoscopy on 11/07/2014.  Biopsies were positive for adenocarcinoma.  He says he does still have some discomfort at his incision. He has not noted any drainage. He's also had a little bit of a sore throat but throat lozenges help that.  Past Medical History  Diagnosis Date  . Hypertension   . Abnormal MRI, spine 05/12/2011  . Nausea 01/2014  . BPH (benign prostatic hypertrophy)   . Hx of adenomatous colonic polyps     h/o tubulovillous adenomas  . Diverticulitis   . Brain metastasis ([place])   . Metastasis to brain ([place])   . Lung mass     Right Upper Lobe  . GERD (gastroesophageal reflux disease)   . Blurred vision   . Brain metastases [facility])       Current Outpatient Prescriptions  Medication Sig Dispense Refill  . dexamethasone (DECADRON) 4 MG tablet Start taking 1 tablet ('4mg'$ ) twice daily with food. On Oct 7, continue to taper as instructed. 120 tablet 0  . metoprolol tartrate (LOPRESSOR) 25 MG tablet Take 1 tablet (25 mg total) by mouth 2 (two) times daily. 60 tablet 1  . omeprazole (PRILOSEC) 20 MG capsule 1 PO 30 MINS PRIOR TO BREAKFAST AND AT BEDTIME 180 capsule 3  . ondansetron (ZOFRAN ODT) 4 MG disintegrating tablet Take 1 tablet (4 mg total) by mouth every 6 (six) hours as needed for nausea. 30 tablet 3   No current facility-administered medications for this visit.    Physical Exam BP 103/63 mmHg  Pulse 69  Resp 16  Ht '5\' 11"'$  (1.803 m)  Wt 175 lb (79.379 kg)  BMI 24.42 kg/m2  SpO54 57% 74 year old man in no acute distress Incision clean dry and intact  Diagnostic Tests: None  Impression: 74 year old man who is now status post mediastinoscopy. His incisions healing well. There is no restrictions on him from my standpoint.  He is going to be  treated for metastatic cancer by Dr. Whitney Muse  Plan:   I will be happy to see Mr. Oscar back any time if I can be of any further assistance with his care.  Melrose Nakayama, MD [facility] [phone]

## 2014-11-29 ENCOUNTER — Telehealth (HOSPITAL_COMMUNITY): Payer: Self-pay

## 2014-11-29 NOTE — Telephone Encounter (Signed)
Spoke with Jared Gill's wife and she will let him know that Jared Gill's test came back negative and that our nurse navigator will be contacting him next week to get Jared Gill scheduled for teaching about the chemotherapy that he will be getting.

## 2014-11-30 ENCOUNTER — Inpatient Hospital Stay (HOSPITAL_COMMUNITY)
Admission: EM | Admit: 2014-11-30 | Discharge: 2014-12-04 | DRG: 690 | Disposition: A | Discharge status: Home Health | Payer: Commercial Managed Care - HMO | Attending: Internal Medicine | Admitting: Internal Medicine

## 2014-11-30 ENCOUNTER — Emergency Department (HOSPITAL_COMMUNITY): Payer: Commercial Managed Care - HMO

## 2014-11-30 ENCOUNTER — Encounter (HOSPITAL_COMMUNITY): Payer: Self-pay

## 2014-11-30 DIAGNOSIS — C3490 Malignant neoplasm of unspecified part of unspecified bronchus or lung: Secondary | ICD-10-CM | POA: Insufficient documentation

## 2014-11-30 DIAGNOSIS — K219 Gastro-esophageal reflux disease without esophagitis: Secondary | ICD-10-CM | POA: Diagnosis not present

## 2014-11-30 DIAGNOSIS — Z79899 Other long term (current) drug therapy: Secondary | ICD-10-CM | POA: Diagnosis not present

## 2014-11-30 DIAGNOSIS — Z8249 Family history of ischemic heart disease and other diseases of the circulatory system: Secondary | ICD-10-CM | POA: Diagnosis not present

## 2014-11-30 DIAGNOSIS — A09 Infectious gastroenteritis and colitis, unspecified: Secondary | ICD-10-CM | POA: Diagnosis not present

## 2014-11-30 DIAGNOSIS — C7931 Secondary malignant neoplasm of brain: Secondary | ICD-10-CM | POA: Diagnosis present

## 2014-11-30 DIAGNOSIS — D696 Thrombocytopenia, unspecified: Secondary | ICD-10-CM | POA: Diagnosis present

## 2014-11-30 DIAGNOSIS — Z87891 Personal history of nicotine dependence: Secondary | ICD-10-CM

## 2014-11-30 DIAGNOSIS — N400 Enlarged prostate without lower urinary tract symptoms: Secondary | ICD-10-CM | POA: Diagnosis not present

## 2014-11-30 DIAGNOSIS — Z801 Family history of malignant neoplasm of trachea, bronchus and lung: Secondary | ICD-10-CM

## 2014-11-30 DIAGNOSIS — Z823 Family history of stroke: Secondary | ICD-10-CM | POA: Diagnosis not present

## 2014-11-30 DIAGNOSIS — E872 Acidosis, unspecified: Secondary | ICD-10-CM

## 2014-11-30 DIAGNOSIS — I10 Essential (primary) hypertension: Secondary | ICD-10-CM | POA: Diagnosis not present

## 2014-11-30 DIAGNOSIS — Z86010 Personal history of colonic polyps: Secondary | ICD-10-CM | POA: Diagnosis not present

## 2014-11-30 DIAGNOSIS — N3000 Acute cystitis without hematuria: Secondary | ICD-10-CM

## 2014-11-30 DIAGNOSIS — R55 Syncope and collapse: Secondary | ICD-10-CM | POA: Diagnosis present

## 2014-11-30 DIAGNOSIS — Z923 Personal history of irradiation: Secondary | ICD-10-CM | POA: Diagnosis not present

## 2014-11-30 DIAGNOSIS — Z85841 Personal history of malignant neoplasm of brain: Secondary | ICD-10-CM | POA: Diagnosis not present

## 2014-11-30 DIAGNOSIS — K5792 Diverticulitis of intestine, part unspecified, without perforation or abscess without bleeding: Secondary | ICD-10-CM | POA: Diagnosis not present

## 2014-11-30 DIAGNOSIS — Z833 Family history of diabetes mellitus: Secondary | ICD-10-CM

## 2014-11-30 DIAGNOSIS — R531 Weakness: Secondary | ICD-10-CM | POA: Diagnosis not present

## 2014-11-30 DIAGNOSIS — R5383 Other fatigue: Secondary | ICD-10-CM | POA: Diagnosis not present

## 2014-11-30 DIAGNOSIS — N390 Urinary tract infection, site not specified: Principal | ICD-10-CM | POA: Diagnosis present

## 2014-11-30 DIAGNOSIS — K6289 Other specified diseases of anus and rectum: Secondary | ICD-10-CM | POA: Insufficient documentation

## 2014-11-30 DIAGNOSIS — H538 Other visual disturbances: Secondary | ICD-10-CM | POA: Diagnosis not present

## 2014-11-30 DIAGNOSIS — K529 Noninfective gastroenteritis and colitis, unspecified: Secondary | ICD-10-CM

## 2014-11-30 DIAGNOSIS — Z85118 Personal history of other malignant neoplasm of bronchus and lung: Secondary | ICD-10-CM | POA: Diagnosis not present

## 2014-11-30 HISTORY — DX: Malignant neoplasm of unspecified part of unspecified bronchus or lung: C34.90

## 2014-11-30 HISTORY — DX: Malignant neoplasm of brain, unspecified: C71.9

## 2014-11-30 LAB — CBC WITH DIFFERENTIAL/PLATELET
BASOS ABS: 0 10*3/uL (ref 0.0–0.1)
BASOS PCT: 0 %
Eosinophils Absolute: 0 10*3/uL (ref 0.0–0.7)
Eosinophils Relative: 0 %
HEMATOCRIT: 41.7 % (ref 39.0–52.0)
HEMOGLOBIN: 14.5 g/dL (ref 13.0–17.0)
Lymphocytes Relative: 8 %
Lymphs Abs: 0.8 10*3/uL (ref 0.7–4.0)
MCH: 25 pg — ABNORMAL LOW (ref 26.0–34.0)
MCHC: 34.8 g/dL (ref 30.0–36.0)
MCV: 72 fL — ABNORMAL LOW (ref 78.0–100.0)
Monocytes Absolute: 0.8 10*3/uL (ref 0.1–1.0)
Monocytes Relative: 8 %
NEUTROS ABS: 8.7 10*3/uL — AB (ref 1.7–7.7)
NEUTROS PCT: 84 %
Platelets: 64 10*3/uL — ABNORMAL LOW (ref 150–400)
RBC: 5.79 MIL/uL (ref 4.22–5.81)
RDW: 17.9 % — ABNORMAL HIGH (ref 11.5–15.5)
WBC: 10.3 10*3/uL (ref 4.0–10.5)

## 2014-11-30 LAB — COMPREHENSIVE METABOLIC PANEL
ALBUMIN: 2.7 g/dL — AB (ref 3.5–5.0)
ALK PHOS: 41 U/L (ref 38–126)
ALT: 39 U/L (ref 17–63)
AST: 31 U/L (ref 15–41)
Anion gap: 9 (ref 5–15)
BILIRUBIN TOTAL: 0.8 mg/dL (ref 0.3–1.2)
BUN: 17 mg/dL (ref 6–20)
CALCIUM: 7.7 mg/dL — AB (ref 8.9–10.3)
CO2: 22 mmol/L (ref 22–32)
Chloride: 100 mmol/L — ABNORMAL LOW (ref 101–111)
Creatinine, Ser: 1.01 mg/dL (ref 0.61–1.24)
GFR calc Af Amer: >60 mL/min (ref >60)
GFR calc non Af Amer: >60 mL/min (ref >60)
GLUCOSE: 186 mg/dL — AB (ref 65–99)
POTASSIUM: 3.9 mmol/L (ref 3.5–5.1)
Sodium: 131 mmol/L — ABNORMAL LOW (ref 135–145)
TOTAL PROTEIN: 5 g/dL — AB (ref 6.5–8.1)

## 2014-11-30 LAB — I-STAT CG4 LACTIC ACID, ED
LACTIC ACID, VENOUS: 2.75 mmol/L — AB (ref 0.5–2.0)
Lactic Acid, Venous: 5.01 mmol/L (ref 0.5–2.0)

## 2014-11-30 LAB — URINE MICROSCOPIC-ADD ON

## 2014-11-30 LAB — URINALYSIS, ROUTINE W REFLEX MICROSCOPIC
Bilirubin Urine: NEGATIVE
Glucose, UA: NEGATIVE mg/dL
HGB URINE DIPSTICK: NEGATIVE
Ketones, ur: NEGATIVE mg/dL
LEUKOCYTES UA: NEGATIVE
Nitrite: POSITIVE — AB
Protein, ur: NEGATIVE mg/dL
SPECIFIC GRAVITY, URINE: 1.01 (ref 1.005–1.030)
UROBILINOGEN UA: 4 mg/dL — AB (ref 0.0–1.0)
pH: 6 (ref 5.0–8.0)

## 2014-11-30 LAB — I-STAT TROPONIN, ED
TROPONIN I, POC: 0.02 ng/mL (ref 0.00–0.08)
TROPONIN I, POC: 0.01 ng/mL (ref 0.00–0.08)

## 2014-11-30 MED ORDER — METOPROLOL TARTRATE 25 MG PO TABS
25.0000 mg | ORAL_TABLET | Freq: Two times a day (BID) | ORAL | Status: DC
  Administered 2014-11-30 – 2014-12-03 (×7): 25 mg via ORAL
  Filled 2014-11-30 (×7): qty 1

## 2014-11-30 MED ORDER — CIPROFLOXACIN IN D5W 400 MG/200ML IV SOLN
400.0000 mg | Freq: Two times a day (BID) | INTRAVENOUS | Status: DC
  Administered 2014-11-30 – 2014-12-03 (×7): 400 mg via INTRAVENOUS
  Filled 2014-11-30 (×7): qty 200

## 2014-11-30 MED ORDER — SODIUM CHLORIDE 0.9 % IV BOLUS (SEPSIS)
1000.0000 mL | Freq: Once | INTRAVENOUS | Status: AC
  Administered 2014-11-30: 1000 mL via INTRAVENOUS

## 2014-11-30 MED ORDER — IOHEXOL 350 MG/ML SOLN
100.0000 mL | Freq: Once | INTRAVENOUS | Status: AC | PRN
  Administered 2014-11-30: 100 mL via INTRAVENOUS

## 2014-11-30 MED ORDER — METRONIDAZOLE IN NACL 5-0.79 MG/ML-% IV SOLN
500.0000 mg | Freq: Three times a day (TID) | INTRAVENOUS | Status: DC
  Administered 2014-11-30 – 2014-12-04 (×11): 500 mg via INTRAVENOUS
  Filled 2014-11-30 (×11): qty 100

## 2014-11-30 MED ORDER — SODIUM CHLORIDE 0.9 % IV BOLUS (SEPSIS)
500.0000 mL | Freq: Once | INTRAVENOUS | Status: AC
  Administered 2014-11-30: 500 mL via INTRAVENOUS

## 2014-11-30 MED ORDER — SODIUM CHLORIDE 0.9 % IV SOLN
Freq: Once | INTRAVENOUS | Status: AC
  Administered 2014-11-30: 17:00:00 via INTRAVENOUS

## 2014-11-30 MED ORDER — ALUM & MAG HYDROXIDE-SIMETH 200-200-20 MG/5ML PO SUSP: 30.0000 mL | Freq: Four times a day (QID) | ORAL | Status: DC | PRN

## 2014-11-30 MED ORDER — PANTOPRAZOLE SODIUM 40 MG PO TBEC
80.0000 mg | DELAYED_RELEASE_TABLET | Freq: Every day | ORAL | Status: DC
  Administered 2014-11-30 – 2014-12-03 (×4): 80 mg via ORAL
  Filled 2014-11-30 (×4): qty 2

## 2014-11-30 MED ORDER — LORAZEPAM 1 MG PO TABS
1.0000 mg | ORAL_TABLET | Freq: Two times a day (BID) | ORAL | Status: DC | PRN
  Administered 2014-12-01 – 2014-12-03 (×3): 1 mg via ORAL
  Filled 2014-11-30 (×3): qty 1

## 2014-11-30 MED ORDER — LORAZEPAM 2 MG/ML IJ SOLN
0.5000 mg | Freq: Once | INTRAMUSCULAR | Status: AC
  Administered 2014-11-30: 0.5 mg via INTRAVENOUS
  Filled 2014-11-30: qty 1

## 2014-11-30 MED ORDER — ONDANSETRON HCL 4 MG/2ML IJ SOLN: 4.0000 mg | Freq: Four times a day (QID) | INTRAMUSCULAR | Status: DC | PRN

## 2014-11-30 MED ORDER — SODIUM CHLORIDE 0.9 % IJ SOLN
3.0000 mL | Freq: Two times a day (BID) | INTRAMUSCULAR | Status: DC
  Administered 2014-11-30 – 2014-12-03 (×6): 3 mL via INTRAVENOUS

## 2014-11-30 MED ORDER — SODIUM CHLORIDE 0.9 % IV SOLN
INTRAVENOUS | Status: DC
  Administered 2014-11-30 – 2014-12-02 (×5): via INTRAVENOUS

## 2014-11-30 MED ORDER — DEXAMETHASONE 4 MG PO TABS
4.0000 mg | ORAL_TABLET | Freq: Every day | ORAL | Status: DC
  Administered 2014-12-01 – 2014-12-03 (×3): 4 mg via ORAL
  Filled 2014-11-30 (×6): qty 1

## 2014-11-30 MED ORDER — ONDANSETRON HCL 4 MG PO TABS: 4.0000 mg | ORAL_TABLET | Freq: Four times a day (QID) | ORAL | Status: DC | PRN

## 2014-11-30 MED ORDER — ENOXAPARIN SODIUM 40 MG/0.4ML ~~LOC~~ SOLN
40.0000 mg | SUBCUTANEOUS | Status: DC
  Administered 2014-11-30 – 2014-12-03 (×4): 40 mg via SUBCUTANEOUS
  Filled 2014-11-30 (×4): qty 0.4

## 2014-11-30 NOTE — ED Notes (Signed)
EMS spoke with family reports pt had been constipated and was given medication. He was on the toilet straining to have BM

## 2014-11-30 NOTE — ED Notes (Signed)
Resident expresses need to void unable to void. Order received from Dr Willy Eddy to In and out cath. In and out performed, obtained 600 ml dark yellow urine. Specimen sent to lab as ordered. Pt continues to have small loose BM's.

## 2014-11-30 NOTE — ED Notes (Signed)
DR Louis Meckel notifed of lactic acid 5.01

## 2014-11-30 NOTE — ED Notes (Signed)
Brought in by EMS. Family report that he used bathroom and was found unresponsive on BR floor. When EMS arrived he was breathing, but still unresponsive. Regained consciousness in ambulance.  BP 122/96 sat 99. CBG 191

## 2014-11-30 NOTE — ED Provider Notes (Signed)
CSN: [id number]     Arrival date & time 11/30/14  1234 History  By signing my name below, I, Jared Gill, attest that this documentation has been prepared under the direction and in the presence of Harvel Quale, MD. Electronically Signed: Hilda Gill, ED Scribe. 11/30/2014. 7:35 PM.    Chief Complaint  Patient presents with  . Loss of Consciousness      HPI HPI Comments: Jared Gill is a 74 y.o. male who presents to the Emergency Department complaining of loss of consciousness.  The patient went to use the bathroom after being given a medication for constipation and his family member heard a loud noise and when they went in to check on the patient he was laying on the ground unconscious.  The patient apparently was tremulous per family but they said he was not jerking and to them it didn't look like a seizure.  They called EMS and upon EMS arrival the patient was still unresponsive but breathing on his own.  He regained consciousness in the ambulance.  Patient does not remember the episode and is a not able to give much history saying that his brother is taking care of most of his medical care.  The patient reports blurry vision which it looks like per history is not new.  He has been having multiple loose stools since the episode in the bathroom and has some vague discomfort in his abdomen.   Past Medical History  Diagnosis Date  . Hypertension   . Abnormal MRI, spine 05/12/2011  . Nausea 01/2014  . BPH (benign prostatic hypertrophy)   . Hx of adenomatous colonic polyps     h/o tubulovillous adenomas  . Diverticulitis   . Lung mass     Right Upper Lobe  . GERD (gastroesophageal reflux disease)   . Blurred vision   . Brain metastasis ([name])   . Metastasis to brain ([name])   . Brain metastases ([name])   . Brain cancer ([name])   . Lung cancer ([name])   . Brain cancer [name]) Brain cancer   Past Surgical History  Procedure Laterality Date  . Cholecystectomy  2009  . Polyp removal  via colonoscopy    . Colonoscopy with esophagogastroduodenoscopy (egd) N/A 04/06/2013    FYB:OFBPZWCH size internal hemorrhoids/moderate diverticulosis/medium sized HH  . Tonsilectomy, adenoidectomy, bilateral myringotomy and tubes    . Video bronchoscopy with endobronchial ultrasound N/A 10/29/2014    Procedure: ATTEMPTED VIDEO BRONCHOSCOPY WITH ENDOBRONCHIAL ULTRASOUND;  Surgeon: Javier Glazier, MD;  Location: [place];  Service: Thoracic;  Laterality: N/A;  . Mediastinoscopy N/A 11/07/2014    Procedure: MEDIASTINOSCOPY;  Surgeon: Melrose Nakayama, MD;  Location: [facility];  Service: Thoracic;  Laterality: N/A;   Family History  Problem Relation Age of Onset  . Diabetes Father   . Diabetes Sister   . Lung cancer Brother   . CVA Brother   . Colon cancer Neg Hx   . CAD Brother   . Heart attack Neg Hx   . Hypertension Brother   . Stroke Brother    Social History  Substance Use Topics  . Smoking status: Former Smoker -- 0.50 packs/day for 40 years    Types: Cigarettes    Quit date: 10/17/2009  . Smokeless tobacco: Never Used  . Alcohol Use: No     Comment: Remote EtOH    Review of Systems  Constitutional: Negative for fever and fatigue.  HENT: Negative for dental problem and rhinorrhea.   Eyes:  Positive for visual disturbance (blurry vision).  Respiratory: Negative for cough and shortness of breath.   Cardiovascular: Negative for chest pain.  Gastrointestinal: Positive for abdominal pain, diarrhea and constipation. Negative for vomiting.  Genitourinary: Negative for decreased urine volume and difficulty urinating.  Musculoskeletal: Negative for myalgias and back pain.  Skin: Negative for rash.  Neurological: Positive for syncope.  Hematological: Does not bruise/bleed easily.    A complete 10 system review of systems was obtained and all systems are negative except as noted in the HPI and PMH.    Allergies  Reglan  Home Medications   Prior to Admission medications    Medication Sig Start Date End Date Taking? Authorizing Provider  dexamethasone (DECADRON) 4 MG tablet Start taking 1 tablet ('4mg'$ ) twice daily with food. On Oct 7, continue to taper as instructed. Patient taking differently: Take 4 mg by mouth daily. continue to taper as instructed. 11/15/14  Yes Eppie Gibson, MD  LORazepam (ATIVAN) 1 MG tablet Take 1 mg by mouth 2 (two) times daily as needed for anxiety.  11/11/14  Yes Historical Provider, MD  metoprolol tartrate (LOPRESSOR) 25 MG tablet Take 1 tablet (25 mg total) by mouth 2 (two) times daily. 11/08/14  Yes Coolidge Breeze, PA-C  omeprazole (PRILOSEC) 20 MG capsule 1 PO 30 MINS PRIOR TO BREAKFAST AND AT BEDTIME 08/22/14  Yes Danie Binder, MD  ondansetron (ZOFRAN ODT) 4 MG disintegrating tablet Take 1 tablet (4 mg total) by mouth every 6 (six) hours as needed for nausea. Patient taking differently: Take 4 mg by mouth at bedtime.  05/09/14  Yes Danie Binder, MD  polyethylene glycol powder (GLYCOLAX/MIRALAX) powder Take 17 g by mouth daily.   Yes Historical Provider, MD   BP 112/65 mmHg  Pulse 90  Temp(Src) 97.7 F (36.5 C) (Oral)  Resp 24  Ht   SpO2 95% Physical Exam  Constitutional: No distress.  HENT:  Head: Normocephalic and atraumatic.  Right Ear: External ear normal. No hemotympanum.  Left Ear: External ear normal. No hemotympanum.  Mouth/Throat: Oropharynx is clear and moist.  Eyes: EOM are normal. Right eye exhibits no discharge. Left eye exhibits no discharge.  Neck: Normal range of motion. Neck supple. No spinous process tenderness and no muscular tenderness present.  Cardiovascular: An irregular rhythm present. Tachycardia present.   Pulmonary/Chest: Effort normal. No respiratory distress.  Course breath sounds bilaterally  Abdominal: Soft. He exhibits no distension. There is no tenderness.  Musculoskeletal: He exhibits no edema or tenderness.  Neurological: He is alert.  Answering simple questions.  Symmetric strength in the  upper and lower extremities  Skin: Skin is warm and dry. No rash noted. He is not diaphoretic.  Vitals reviewed.   ED Course  Procedures (including critical care time)  DIAGNOSTIC STUDIES: Oxygen Saturation is 99% on room air, normal by my interpretation.    COORDINATION OF CARE: 7:35 PM Discussed treatment plan with pt at bedside and pt agreed to plan.   Labs Review Labs Reviewed  CBC WITH DIFFERENTIAL/PLATELET - Abnormal; Notable for the following:    MCV 72.0 (*)    MCH 25.0 (*)    RDW 17.9 (*)    Platelets 64 (*)    Neutro Abs 8.7 (*)    All other components within normal limits  COMPREHENSIVE METABOLIC PANEL - Abnormal; Notable for the following:    Sodium 131 (*)    Chloride 100 (*)    Glucose, Bld 186 (*)    Calcium 7.7 (*)  Total Protein 5.0 (*)    Albumin 2.7 (*)    All other components within normal limits  URINALYSIS, ROUTINE W REFLEX MICROSCOPIC (NOT AT [facility]) - Abnormal; Notable for the following:    Urobilinogen, UA 4.0 (*)    Nitrite POSITIVE (*)    All other components within normal limits  URINE MICROSCOPIC-ADD ON - Abnormal; Notable for the following:    Bacteria, UA MANY (*)    All other components within normal limits  I-STAT CG4 LACTIC ACID, ED - Abnormal; Notable for the following:    Lactic Acid, Venous 5.01 (*)    All other components within normal limits  I-STAT CG4 LACTIC ACID, ED - Abnormal; Notable for the following:    Lactic Acid, Venous 2.75 (*)    All other components within normal limits  CULTURE, BLOOD (ROUTINE X 2)  CULTURE, BLOOD (ROUTINE X 2)  URINE CULTURE  I-STAT TROPOININ, ED  I-STAT TROPOININ, ED  I-STAT TROPOININ, ED    Imaging Review Ct Head Wo Contrast  11/30/2014   CLINICAL DATA:  Syncope and fall off toilet this morning. Positive loss of consciousness.  EXAM: CT HEAD WITHOUT CONTRAST  CT CERVICAL SPINE WITHOUT CONTRAST  TECHNIQUE: Multidetector CT imaging of the head and cervical spine was performed following the  standard protocol without intravenous contrast. Multiplanar CT image reconstructions of the cervical spine were also generated.  COMPARISON:  None.  FINDINGS: CT HEAD FINDINGS  Bony calvarium appears intact. Minimal bilateral maxillary sinusitis is noted. Mild diffuse cortical atrophy is noted. No mass effect or midline shift is noted. Ventricular size is within normal limits. There is no evidence of mass lesion, hemorrhage or acute infarction.  CT CERVICAL SPINE FINDINGS  No fracture is noted. Minimal grade 1 retrolisthesis is noted at C4-5 and C5-6 secondary to degenerative disc disease at these levels. Anterior osteophyte formation is noted at these levels as well. Severe degenerative disc disease is noted at C6-7. No significant abnormality seen involving the visualized lung apices.  IMPRESSION: Minimal bilateral maxillary sinusitis. Mild diffuse cortical atrophy. No acute intracranial abnormality seen.  Severe multilevel degenerative disc disease is noted. No acute abnormality seen in the cervical spine.   Electronically Signed   By: Marijo Conception, M.D.   On: 11/30/2014 14:42   Ct Cervical Spine Wo Contrast  11/30/2014   CLINICAL DATA:  Syncope and fall off toilet this morning. Positive loss of consciousness.  EXAM: CT HEAD WITHOUT CONTRAST  CT CERVICAL SPINE WITHOUT CONTRAST  TECHNIQUE: Multidetector CT imaging of the head and cervical spine was performed following the standard protocol without intravenous contrast. Multiplanar CT image reconstructions of the cervical spine were also generated.  COMPARISON:  None.  FINDINGS: CT HEAD FINDINGS  Bony calvarium appears intact. Minimal bilateral maxillary sinusitis is noted. Mild diffuse cortical atrophy is noted. No mass effect or midline shift is noted. Ventricular size is within normal limits. There is no evidence of mass lesion, hemorrhage or acute infarction.  CT CERVICAL SPINE FINDINGS  No fracture is noted. Minimal grade 1 retrolisthesis is noted at  C4-5 and C5-6 secondary to degenerative disc disease at these levels. Anterior osteophyte formation is noted at these levels as well. Severe degenerative disc disease is noted at C6-7. No significant abnormality seen involving the visualized lung apices.  IMPRESSION: Minimal bilateral maxillary sinusitis. Mild diffuse cortical atrophy. No acute intracranial abnormality seen.  Severe multilevel degenerative disc disease is noted. No acute abnormality seen in the cervical spine.  Electronically Signed   By: Marijo Conception, M.D.   On: 11/30/2014 14:42   Dg Abd Acute W/chest  11/30/2014   CLINICAL DATA:  Loss of bowel control.  Mental status changes.  EXAM: DG ABDOMEN ACUTE W/ 1V CHEST  COMPARISON:  11/07/2014  FINDINGS: There is a moderate amount of gas within small and large bowel but the pattern does not suggest ileus or obstruction. No sign of free air. There is interposition of colon between the liver and the diaphragm. Clips in the right upper quadrant consistent with previous cholecystectomy. Vascular calcification.  One-view chest shows normal heart size. There is mild volume loss in the left lower lobe. There are made of the chest is clear. No free air seen.  IMPRESSION: No acute intra abdominal finding. Gas within small and large bowel but no evidence of ileus, obstruction or free air. Probable mild atelectasis in the left lower lobe.   Electronically Signed   By: Nelson Chimes M.D.   On: 11/30/2014 14:38   Ct Angio Abd/pel W/ And/or W/o  11/30/2014   CLINICAL DATA:  74 year old with recent diagnosis of metastatic lung cancer involving the right upper lobe. Patient presents with an episode of syncope while on the toilet earlier today and has a markedly elevated serum lactic acid. Concern for mesenteric ischemia.  EXAM: CTA ABDOMEN AND PELVIS WITHOUT CONTRAST  TECHNIQUE: Multidetector CT imaging of the abdomen and pelvis was performed using the standard protocol during bolus administration of  intravenous contrast. Multiplanar reconstructed images and MIPs were obtained and reviewed to evaluate the vascular anatomy.  CONTRAST:  122m OMNIPAQUE IOHEXOL 350 MG/ML IV.  COMPARISON:  PET-CT 10/21/2014. CT abdomen and pelvis 10/10/2014 dating back to 04/14/2013.  FINDINGS: Celiac artery patent with mild origin stenosis. SMA widely patent, and no branch stenoses or occlusions are visible. 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 Single bilateral renal arteries with probable high-grade stenosis involving the origin of the left renal artery  Since the most recent prior CT 10/10/2014, interval increase in size of a posterior segment right lobe liver metastasis now measuring 1.7 cm (series 8, image 28). New vague approximate 0.8 cm anterior segment right lobe liver metastasis (image 23). Normal-appearing spleen and adrenal glands. Atrophy involving the pancreatic body and tail without focal pancreatic parenchymal abnormality. Gallbladder surgically absent. No biliary ductal dilation. Exophytic simple cyst arising from the lower pole of the right kidney as noted previously; no significant abnormality involving either kidney.  Since the 10/10/2014 CT, development of presacral edema and edema surrounding the upper and mid rectum with hyperemia involving the wall of this segment of the rectum which measures approximately 12 cm in length. This segment of the rectum is of diminished caliber relative to the lower rectum. No associated wall thickening. Descending and sigmoid colon diverticulosis without evidence of acute diverticulitis. No evidence of colonic obstruction.  Stomach normal in appearance for degree of distention. Normal-appearing small bowel. Cecum positioned in the right upper quadrant, and normal-appearing gas-filled appendix identified in the mid abdomen extending almost to midline. No  ascites.  Urinary bladder unremarkable. Prostate gland and seminal vesicles normal for age. Six numerous pelvic phleboliths.  Degenerative disc disease and spondylosis involving the visualized lower thoracic and entire lumbar spine. Visualized lung bases clear apart from scarring in the lingula, right middle lobe and both lower lobes. Heart size upper normal.  Review of  the MIP images confirms the above findings.  IMPRESSION: 1. No evidence of large vessel mesenteric ischemia. 2. Approximate 12 cm segment of the upper and mid rectum demonstrating diminished caliber relative to the lower rectum, hyperemia involving its mucosa and wall without associated wall thickening, and edema/inflammation in the surrounding fat, including the presacral space. This is a new finding since 09/2014 and likely indicates focal infectious colitis versus focal ischemic colitis. 3. Interval increase in size of a posterior segment right lobe liver metastasis and development of a possible new anterior segment right lobe metastasis since 09/2014.   Electronically Signed   By: Evangeline Dakin M.D.   On: 11/30/2014 17:26   I have personally reviewed and evaluated these images and lab results as part of my medical decision-making.   EKG Interpretation   Date/Time:  Saturday November 30 2014 12:36:19 EDT Ventricular Rate:  85 PR Interval:  128 QRS Duration: 92 QT Interval:  352 QTC Calculation: 418 R Axis:   -33 Text Interpretation:  Sinus rhythm Left axis deviation Rhythm has changed  since previous tracing Confirmed by Jiles Goya ([id number]) on 11/30/2014  12:46:50 PM      MDM  Patient was seen and evaluated in stable condition.  EKG with sinus arrhythmia.  Laboratory results consistent with UTI and patient with severe lactic acidosis.  In light of continued diarrhea and abdominal discomfort CT ordered which showed Colitis of the rectum.  CT reviewed with Dr. Arnoldo Morale from surgery who reviewed the film from home and said at  this time no indication for surgical intervention and that he would recommend continuing hydration and Cipro/Flagyl as well as medical admission.  Patient started on Cipro/flagyl which should cover for UTI as well.  Lactic acid improved with fluid hydration.  Discussed with Dr. Jerilee Hoh from the hospitalist group who agreed with admission and patient was admitted under the care of Dr. Willey Blade his PCP on telemetry in light of the syncopal episode. Final diagnoses:  Lactic acidosis  Colitis  Syncope, unspecified syncope type    1. Syncope  2. Colitis  3. Lactic acidosis  I personally performed the services described in this documentation, which was scribed in my presence. The recorded information has been reviewed and is accurate.   Harvel Quale, MD 11/30/14 [phone]

## 2014-11-30 NOTE — H&P (Signed)
History and Physical  Jared Gill Jared Gill DOB: 1940/05/16 DOA: 11/30/2014  Referring physician: Dr Alfonse Spruce, ED physician PCP: Asencion Noble, MD   Chief Complaint: Syncope  HPI: Jared Gill is a 74 y.o. 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  Patient was brought to the hospital after syncopal episode earlier today. Patient been complaining of abdominal pain and had not passed stool over the past few days. The patient's family gave him a dose of miralax yesterday. Today, the patient cannot use the bathroom and while in the bathroom had a syncopal episode. Since being in the hospital, patient has had multiple episodes of nonbloody diarrhea. No palliating or provoking factors.    Review of Systems:   Pt complains of  Blurred vision, which is a chronic problem..  Pt denies any fevers, chills, nausea, vomiting, constipation, abdominal pain, shortness of breath, dyspnea on exertion, orthopnea, cough, wheezing, palpitations, headache, vision changes, lightheadedness, dizziness, constipation, melena, rectal bleeding.  Review of systems are otherwise negative  Past Medical History  Diagnosis Date  . Hypertension   . Abnormal MRI, spine 05/12/2011  . Nausea 01/2014  . BPH (benign prostatic hypertrophy)   . Hx of adenomatous colonic polyps     h/o tubulovillous adenomas  . Diverticulitis   . Lung mass     Right Upper Lobe  . GERD (gastroesophageal reflux disease)   . Blurred vision   . Brain metastasis ([place])   . Metastasis to brain ([place])   . Brain metastases ([place])   . Brain cancer ([place])   . Lung cancer ([place])   . Brain cancer [facility]) Brain cancer   Past Surgical History  Procedure Laterality Date  . Cholecystectomy  2009  . Polyp removal via colonoscopy    . Colonoscopy with esophagogastroduodenoscopy (egd) N/A 04/06/2013    OEV:[id number] size internal hemorrhoids/moderate  diverticulosis/medium sized HH  . Tonsilectomy, adenoidectomy, bilateral myringotomy and tubes    . Video bronchoscopy with endobronchial ultrasound N/A 10/29/2014    Procedure: ATTEMPTED VIDEO BRONCHOSCOPY WITH ENDOBRONCHIAL ULTRASOUND;  Surgeon: Javier Glazier, MD;  Location: [place];  Service: Thoracic;  Laterality: N/A;  . Mediastinoscopy N/A 11/07/2014    Procedure: MEDIASTINOSCOPY;  Surgeon: Melrose Nakayama, MD;  Location: [place];  Service: Thoracic;  Laterality: N/A;   Social History:  reports that he quit smoking about 5 years ago. His smoking use included Cigarettes. He has a 20 pack-year smoking history. He has never used smokeless tobacco. He reports that he does not drink alcohol or use illicit drugs. Patient lives at home with family and is able to participate in activities of daily living  with assistance  Allergies  Allergen Reactions  . Reglan [Metoclopramide]     ANXIETY, AGITATION, JITTERY    Family History  Problem Relation Age of Onset  . Diabetes Father   . Diabetes Sister   . Lung cancer Brother   . CVA Brother   . Colon cancer Neg Hx   . CAD Brother   . Heart attack Neg Hx   . Hypertension Brother   . Stroke Brother      Prior to Admission medications   Medication Sig Start Date End Date Taking? Authorizing Provider  dexamethasone (DECADRON) 4 MG tablet Start taking 1 tablet ('4mg'$ ) twice daily with food. On Oct 7, continue to taper as instructed. Patient taking differently: Take 4 mg by mouth daily. continue to taper as  instructed. 11/15/14  Yes Eppie [place], MD  LORazepam (ATIVAN) 1 MG tablet Take 1 mg by mouth 2 (two) times daily as needed for anxiety.  11/11/14  Yes Historical Provider, MD  metoprolol tartrate (LOPRESSOR) 25 MG tablet Take 1 tablet (25 mg total) by mouth 2 (two) times daily. 11/08/14  Yes Coolidge Breeze, PA-C  omeprazole (PRILOSEC) 20 MG capsule 1 PO 30 MINS PRIOR TO BREAKFAST AND AT BEDTIME 08/22/14  Yes Danie Binder, MD  ondansetron  (ZOFRAN ODT) 4 MG disintegrating tablet Take 1 tablet (4 mg total) by mouth every 6 (six) hours as needed for nausea. Patient taking differently: Take 4 mg by mouth at bedtime.  05/09/14  Yes Danie Binder, MD  polyethylene glycol powder (GLYCOLAX/MIRALAX) powder Take 17 g by mouth daily.   Yes Historical Provider, MD    Physical Exam: BP 113/59 mmHg  Pulse 111  Temp(Src) 97.8 F (36.6 C) (Oral)  Resp 22  Ht   Wt 103.3 kg (227 lb 11.8 oz)  SpO2 99%  Generaelderly black male  Awake and alert and oriented x3. No acute cardiopulmonary distress.  Eyes: Pupils equal, round, reactive to light. Extraocular muscles are intact. Sclerae anicteric and noninjected.  ENT: Moist mucosal membranes. No mucosal lesions.  Neck: Neck supple without lymphadenopathy. No carotid bruits. No masses palpated.  Cardiovascular: Regular rate with normal S1-S2 sounds. No murmurs, rubs, gallops auscultated. No JVD.  Respiratory: Good respiratory effort with no wheezes, rales, rhonchi. Lungs clear to auscultation bilaterally.  Abdomen: Soft,  Mild tenderness in the lower quadrants and suprapubically.  Nondistended. Active bowel sounds. No masses or hepatosplenomegaly  Skin: Dry, warm to touch. 2+ dorsalis pedis and radial pulses. Musculoskeletal: No calf or leg pain. All major joints not erythematous nontender.  Psychiatric: Intact judgment and insight.  Neurologic: No focal neurological deficits. Cranial nerves II through XII are grossly intact.           Labs on Admission:  Basic Metabolic Panel:  Recent Labs Lab 11/30/14 1319  NA 131*  K 3.9  CL 100*  CO2 22  GLUCOSE 186*  BUN 17  CREATININE 1.01  CALCIUM 7.7*   Liver Function Tests:  Recent Labs Lab 11/30/14 1319  AST 31  ALT 39  ALKPHOS 41  BILITOT 0.8  PROT 5.0*  ALBUMIN 2.7*   No results for input(s): LIPASE, AMYLASE in the last 168 hours. No results for input(s): AMMONIA in the last 168 hours. CBC:  Recent Labs Lab  11/30/14 1319  WBC 10.3  NEUTROABS 8.7*  HGB 14.5  HCT 41.7  MCV 72.0*  PLT 64*   Cardiac Enzymes: No results for input(s): CKTOTAL, CKMB, CKMBINDEX, TROPONINI in the last 168 hours.  BNP (last 3 results) No results for input(s): BNP in the last 8760 hours.  ProBNP (last 3 results) No results for input(s): PROBNP in the last 8760 hours.  CBG: No results for input(s): GLUCAP in the last 168 hours.  Radiological Exams on Admission: Ct Head Wo Contrast  11/30/2014   CLINICAL DATA:  Syncope and fall off toilet this morning. Positive loss of consciousness.  EXAM: CT HEAD WITHOUT CONTRAST  CT CERVICAL SPINE WITHOUT CONTRAST  TECHNIQUE: Multidetector CT imaging of the head and cervical spine was performed following the standard protocol without intravenous contrast. Multiplanar CT image reconstructions of the cervical spine were also generated.  COMPARISON:  None.  FINDINGS: CT HEAD FINDINGS  Bony calvarium appears intact. Minimal bilateral maxillary sinusitis is noted. Mild diffuse  cortical atrophy is noted. No mass effect or midline shift is noted. Ventricular size is within normal limits. There is no evidence of mass lesion, hemorrhage or acute infarction.  CT CERVICAL SPINE FINDINGS  No fracture is noted. Minimal grade 1 retrolisthesis is noted at C4-5 and C5-6 secondary to degenerative disc disease at these levels. Anterior osteophyte formation is noted at these levels as well. Severe degenerative disc disease is noted at C6-7. No significant abnormality seen involving the visualized lung apices.  IMPRESSION: Minimal bilateral maxillary sinusitis. Mild diffuse cortical atrophy. No acute intracranial abnormality seen.  Severe multilevel degenerative disc disease is noted. No acute abnormality seen in the cervical spine.   Electronically Signed   By: Marijo Conception, M.D.   On: 11/30/2014 14:42   Ct Cervical Spine Wo Contrast  11/30/2014   CLINICAL DATA:  Syncope and fall off toilet this  morning. Positive loss of consciousness.  EXAM: CT HEAD WITHOUT CONTRAST  CT CERVICAL SPINE WITHOUT CONTRAST  TECHNIQUE: Multidetector CT imaging of the head and cervical spine was performed following the standard protocol without intravenous contrast. Multiplanar CT image reconstructions of the cervical spine were also generated.  COMPARISON:  None.  FINDINGS: CT HEAD FINDINGS  Bony calvarium appears intact. Minimal bilateral maxillary sinusitis is noted. Mild diffuse cortical atrophy is noted. No mass effect or midline shift is noted. Ventricular size is within normal limits. There is no evidence of mass lesion, hemorrhage or acute infarction.  CT CERVICAL SPINE FINDINGS  No fracture is noted. Minimal grade 1 retrolisthesis is noted at C4-5 and C5-6 secondary to degenerative disc disease at these levels. Anterior osteophyte formation is noted at these levels as well. Severe degenerative disc disease is noted at C6-7. No significant abnormality seen involving the visualized lung apices.  IMPRESSION: Minimal bilateral maxillary sinusitis. Mild diffuse cortical atrophy. No acute intracranial abnormality seen.  Severe multilevel degenerative disc disease is noted. No acute abnormality seen in the cervical spine.   Electronically Signed   By: Marijo Conception, M.D.   On: 11/30/2014 14:42   Dg Abd Acute W/chest  11/30/2014   CLINICAL DATA:  Loss of bowel control.  Mental status changes.  EXAM: DG ABDOMEN ACUTE W/ 1V CHEST  COMPARISON:  11/07/2014  FINDINGS: There is a moderate amount of gas within small and large bowel but the pattern does not suggest ileus or obstruction. No sign of free air. There is interposition of colon between the liver and the diaphragm. Clips in the right upper quadrant consistent with previous cholecystectomy. Vascular calcification.  One-view chest shows normal heart size. There is mild volume loss in the left lower lobe. There are made of the chest is clear. No free air seen.  IMPRESSION:  No acute intra abdominal finding. Gas within small and large bowel but no evidence of ileus, obstruction or free air. Probable mild atelectasis in the left lower lobe.   Electronically Signed   By: Nelson Chimes M.D.   On: 11/30/2014 14:38   Ct Angio Abd/pel W/ And/or W/o  11/30/2014   CLINICAL DATA:  74 year old with recent diagnosis of metastatic lung cancer involving the right upper lobe. Patient presents with an episode of syncope while on the toilet earlier today and has a markedly elevated serum lactic acid. Concern for mesenteric ischemia.  EXAM: CTA ABDOMEN AND PELVIS WITHOUT CONTRAST  TECHNIQUE: Multidetector CT imaging of the abdomen and pelvis was performed using the standard protocol during bolus administration of intravenous contrast.  Multiplanar reconstructed images and MIPs were obtained and reviewed to evaluate the vascular anatomy.  CONTRAST:  179m OMNIPAQUE IOHEXOL 350 MG/ML IV.  COMPARISON:  PET-CT 10/21/2014. CT abdomen and pelvis 10/10/2014 dating back to 04/14/2013.  FINDINGS: Celiac artery patent with mild origin stenosis. SMA widely patent, and no branch stenoses or occlusions are visible. 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 Single bilateral renal arteries with probable high-grade stenosis involving the origin of the left renal artery  Since the most recent prior CT 10/10/2014, interval increase in size of a posterior segment right lobe liver metastasis now measuring 1.7 cm (series 8, image 28). New vague approximate 0.8 cm anterior segment right lobe liver metastasis (image 23). Normal-appearing spleen and adrenal glands. Atrophy involving the pancreatic body and tail without focal pancreatic parenchymal abnormality. Gallbladder surgically absent. No biliary ductal dilation. Exophytic simple cyst arising from the lower pole of the right kidney as noted  previously; no significant abnormality involving either kidney.  Since the 10/10/2014 CT, development of presacral edema and edema surrounding the upper and mid rectum with hyperemia involving the wall of this segment of the rectum which measures approximately 12 cm in length. This segment of the rectum is of diminished caliber relative to the lower rectum. No associated wall thickening. Descending and sigmoid colon diverticulosis without evidence of acute diverticulitis. No evidence of colonic obstruction.  Stomach normal in appearance for degree of distention. Normal-appearing small bowel. Cecum positioned in the right upper quadrant, and normal-appearing gas-filled appendix identified in the mid abdomen extending almost to midline. No ascites.  Urinary bladder unremarkable. Prostate gland and seminal vesicles normal for age. Six numerous pelvic phleboliths.  Degenerative disc disease and spondylosis involving the visualized lower thoracic and entire lumbar spine. Visualized lung bases clear apart from scarring in the lingula, right middle lobe and both lower lobes. Heart size upper normal.  Review of the MIP images confirms the above findings.  IMPRESSION: 1. No evidence of large vessel mesenteric ischemia. 2. Approximate 12 cm segment of the upper and mid rectum demonstrating diminished caliber relative to the lower rectum, hyperemia involving its mucosa and wall without associated wall thickening, and edema/inflammation in the surrounding fat, including the presacral space. This is a new finding since 09/2014 and likely indicates focal infectious colitis versus focal ischemic colitis. 3. Interval increase in size of a posterior segment right lobe liver metastasis and development of a possible new anterior segment right lobe metastasis since 09/2014.   Electronically Signed   By: TEvangeline DakinM.D.   On: 11/30/2014 17:26    EKG: Independently reviewed. Last rhythm with left axis deviation. Normal  intervals. negative for STEMI  Assessment/Plan Present on Admission:  . Syncope . Infectious colitis . UTI (urinary tract infection)  This patient was discussed with the ED physician, including pertinent vitals, physical exam findings, labs, and imaging.  We also discussed care given by the ED provider.  #1 syncope #2 infectious colitis #3 UTI   Admit to telemetry to Dr. FWilley Blade The patient's syncope is likely due to the infections that he has an less likely to be a cardiac etiology. We will admit him to telemetry to assure he does not have an arrhythmia  Patient did have a normal echo approximately 3 weeks ago. Given the patient has no active cardiac disease, I do not feel that it is necessary to repeat the echocardiogram  We'll treat the patient with metronidazole and ciprofloxacin for his infectious colitis  Obtain a stool culture  Given that the patient has a recent laxity use, I do not feel it necessary to obtain C. difficile culture unless the patient's diarrhea persists.  The patient's ciprofloxacin should cover most pathogens for urinary tract infection - will obtain a urine culture  Check CBC in the morning  Continue IV fluids  DVT prophylaxis: Lovenox  Consultants: None  Code Status: Full code  Family Communication: Brother and nephew   Disposition Plan: Admit to telemetry    Truett Mainland, DO [facility] Pager [phone]

## 2014-12-01 LAB — BASIC METABOLIC PANEL
ANION GAP: 6 (ref 5–15)
BUN: 9 mg/dL (ref 6–20)
CO2: 23 mmol/L (ref 22–32)
Calcium: 7.4 mg/dL — ABNORMAL LOW (ref 8.9–10.3)
Chloride: 106 mmol/L (ref 101–111)
Creatinine, Ser: 0.65 mg/dL (ref 0.61–1.24)
GFR calc Af Amer: >60 mL/min (ref >60)
GLUCOSE: 115 mg/dL — AB (ref 65–99)
POTASSIUM: 4.1 mmol/L (ref 3.5–5.1)
Sodium: 135 mmol/L (ref 135–145)

## 2014-12-01 LAB — GLUCOSE, CAPILLARY: Glucose-Capillary: 110 mg/dL — ABNORMAL HIGH (ref 65–99)

## 2014-12-01 MED ORDER — INFLUENZA VAC SPLIT QUAD 0.5 ML IM SUSY
0.5000 mL | PREFILLED_SYRINGE | INTRAMUSCULAR | Status: AC
  Administered 2014-12-02: 0.5 mL via INTRAMUSCULAR
  Filled 2014-12-01: qty 0.5

## 2014-12-01 MED ORDER — PNEUMOCOCCAL VAC POLYVALENT 25 MCG/0.5ML IJ INJ
0.5000 mL | INJECTION | INTRAMUSCULAR | Status: AC
  Administered 2014-12-02: 0.5 mL via INTRAMUSCULAR
  Filled 2014-12-01: qty 0.5

## 2014-12-01 NOTE — Progress Notes (Signed)
Patient has had a rhythm change per telemetry, ordering a stat EKG to notify on-call MD with results, will continue to monitor the patient.

## 2014-12-01 NOTE — Plan of Care (Signed)
Problem: Phase I Progression Outcomes Goal: OOB as tolerated unless otherwise ordered Outcome: Completed/Met Date Met:  12/01/14 Patient up to chair this afternoon, and stated he would like to try walking later this evening. Goal: Voiding-avoid urinary catheter unless indicated Outcome: Completed/Met Date Met:  12/01/14 Condom cath in use.

## 2014-12-01 NOTE — Progress Notes (Signed)
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 As well as UTI. Lengthy discussion with brother and sister who care for him at home. Resuscitation issue has not been discussed I brought it up with them will await Dr. Willey Blade in the a.m. to broach this issue with patient. I only spoke with brother and sister in the hall. Patient had normal colonoscopy in 2015 showing only diverticulitis cell carcinoma less likely. The patient has increase interval development of 2 liver lesions presumably metastases metastatic disease. Jared Gill FYB:[id number] DOB: [birth year] DOA: 11/30/2014 PCP: Asencion Noble, MD             Physical Exam: Blood pressure 118/79, pulse 79, temperature 97.9 F (36.6 C), temperature source Axillary, resp. rate 18, height 6' 2"  (1.88 m), weight 228 lb 9.9 oz (103.7 kg), SpO2 100 %. lungs show diminished breath sounds in the bases no rales wheeze rhonchi appreciable heart regular rhythm no S3-S4 no heaves thrills rubs abdomen soft nontender no guarding or rebound no left lower quadrant tenderness bowel sounds are normoactive no peristaltic rushes appreciated extremities trace to 1+ pedal edema   Investigations:  Recent Results (from the past 240 hour(s))  Blood culture (routine x 2)     Status: None (Preliminary result)   Collection Time: 11/30/14  2:34 PM  Result Value Ref Range Status   Specimen Description BLOOD LEFT ANTECUBITAL  Final   Special Requests BOTTLES DRAWN AEROBIC AND ANAEROBIC 4CC  Final   Culture PENDING  Incomplete   Report Status PENDING  Incomplete  Culture, blood (routine x 2)     Status: None (Preliminary result)    Collection Time: 11/30/14  2:37 PM  Result Value Ref Range Status   Specimen Description BLOOD BLOOD LEFT HAND  Final   Special Requests BOTTLES DRAWN AEROBIC AND ANAEROBIC 4CC  Final   Culture PENDING  Incomplete   Report Status PENDING  Incomplete     Basic Metabolic Panel:  Recent Labs  11/30/14 1319 12/01/14 0555  NA 131* 135  K 3.9 4.1  CL 100* 106  CO2 22 23  GLUCOSE 186* 115*  BUN 17 9  CREATININE 1.01 0.65  CALCIUM 7.7* 7.4*   Liver Function Tests:  Recent Labs  11/30/14 1319  AST 31  ALT 39  ALKPHOS 41  BILITOT 0.8  PROT 5.0*  ALBUMIN 2.7*     CBC:  Recent Labs  11/30/14 1319  WBC 10.3  NEUTROABS 8.7*  HGB 14.5  HCT 41.7  MCV 72.0*  PLT 64*    Ct Head Wo Contrast  11/30/2014   CLINICAL DATA:  Syncope and fall off toilet this morning. Positive loss of consciousness.  EXAM: CT HEAD WITHOUT CONTRAST  CT CERVICAL SPINE WITHOUT CONTRAST  TECHNIQUE: Multidetector CT imaging of the head and cervical spine was performed following the standard protocol without intravenous contrast. Multiplanar CT image reconstructions of the cervical spine were also generated.  COMPARISON:  None.  FINDINGS: CT HEAD FINDINGS  Bony calvarium appears intact. Minimal bilateral maxillary sinusitis is noted. Mild diffuse cortical atrophy is noted. No mass effect or midline shift is noted. Ventricular size is within normal limits. There is no evidence of mass lesion, hemorrhage or acute infarction.  CT CERVICAL SPINE  FINDINGS  No fracture is noted. Minimal grade 1 retrolisthesis is noted at C4-5 and C5-6 secondary to degenerative disc disease at these levels. Anterior osteophyte formation is noted at these levels as well. Severe degenerative disc disease is noted at C6-7. No significant abnormality seen involving the visualized lung apices.  IMPRESSION: Minimal bilateral maxillary sinusitis. Mild diffuse cortical atrophy. No acute intracranial abnormality seen.  Severe multilevel  degenerative disc disease is noted. No acute abnormality seen in the cervical spine.   Electronically Signed   By: Marijo Conception, M.D.   On: 11/30/2014 14:42   Ct Cervical Spine Wo Contrast  11/30/2014   CLINICAL DATA:  Syncope and fall off toilet this morning. Positive loss of consciousness.  EXAM: CT HEAD WITHOUT CONTRAST  CT CERVICAL SPINE WITHOUT CONTRAST  TECHNIQUE: Multidetector CT imaging of the head and cervical spine was performed following the standard protocol without intravenous contrast. Multiplanar CT image reconstructions of the cervical spine were also generated.  COMPARISON:  None.  FINDINGS: CT HEAD FINDINGS  Bony calvarium appears intact. Minimal bilateral maxillary sinusitis is noted. Mild diffuse cortical atrophy is noted. No mass effect or midline shift is noted. Ventricular size is within normal limits. There is no evidence of mass lesion, hemorrhage or acute infarction.  CT CERVICAL SPINE FINDINGS  No fracture is noted. Minimal grade 1 retrolisthesis is noted at C4-5 and C5-6 secondary to degenerative disc disease at these levels. Anterior osteophyte formation is noted at these levels as well. Severe degenerative disc disease is noted at C6-7. No significant abnormality seen involving the visualized lung apices.  IMPRESSION: Minimal bilateral maxillary sinusitis. Mild diffuse cortical atrophy. No acute intracranial abnormality seen.  Severe multilevel degenerative disc disease is noted. No acute abnormality seen in the cervical spine.   Electronically Signed   By: Marijo Conception, M.D.   On: 11/30/2014 14:42   Dg Abd Acute W/chest  11/30/2014   CLINICAL DATA:  Loss of bowel control.  Mental status changes.  EXAM: DG ABDOMEN ACUTE W/ 1V CHEST  COMPARISON:  11/07/2014  FINDINGS: There is a moderate amount of gas within small and large bowel but the pattern does not suggest ileus or obstruction. No sign of free air. There is interposition of colon between the liver and the diaphragm.  Clips in the right upper quadrant consistent with previous cholecystectomy. Vascular calcification.  One-view chest shows normal heart size. There is mild volume loss in the left lower lobe. There are made of the chest is clear. No free air seen.  IMPRESSION: No acute intra abdominal finding. Gas within small and large bowel but no evidence of ileus, obstruction or free air. Probable mild atelectasis in the left lower lobe.   Electronically Signed   By: Nelson Chimes M.D.   On: 11/30/2014 14:38   Ct Angio Abd/pel W/ And/or W/o  11/30/2014   CLINICAL DATA:  74 year old with recent diagnosis of metastatic lung cancer involving the right upper lobe. Patient presents with an episode of syncope while on the toilet earlier today and has a markedly elevated serum lactic acid. Concern for mesenteric ischemia.  EXAM: CTA ABDOMEN AND PELVIS WITHOUT CONTRAST  TECHNIQUE: Multidetector CT imaging of the abdomen and pelvis was performed using the standard protocol during bolus administration of intravenous contrast. Multiplanar reconstructed images and MIPs were obtained and reviewed to evaluate the vascular anatomy.  CONTRAST:  17m OMNIPAQUE IOHEXOL 350 MG/ML IV.  COMPARISON:  PET-CT 10/21/2014. CT abdomen and pelvis 10/10/2014  dating back to 04/14/2013.  FINDINGS: Celiac artery patent with mild origin stenosis. SMA widely patent, and no branch stenoses or occlusions are visible. 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 Single bilateral renal arteries with probable high-grade stenosis involving the origin of the left renal artery  Since the most recent prior CT 10/10/2014, interval increase in size of a posterior segment right lobe liver metastasis now measuring 1.7 cm (series 8, image 28). New vague approximate 0.8 cm anterior segment right lobe liver metastasis (image 23). Normal-appearing spleen  and adrenal glands. Atrophy involving the pancreatic body and tail without focal pancreatic parenchymal abnormality. Gallbladder surgically absent. No biliary ductal dilation. Exophytic simple cyst arising from the lower pole of the right kidney as noted previously; no significant abnormality involving either kidney.  Since the 10/10/2014 CT, development of presacral edema and edema surrounding the upper and mid rectum with hyperemia involving the wall of this segment of the rectum which measures approximately 12 cm in length. This segment of the rectum is of diminished caliber relative to the lower rectum. No associated wall thickening. Descending and sigmoid colon diverticulosis without evidence of acute diverticulitis. No evidence of colonic obstruction.  Stomach normal in appearance for degree of distention. Normal-appearing small bowel. Cecum positioned in the right upper quadrant, and normal-appearing gas-filled appendix identified in the mid abdomen extending almost to midline. No ascites.  Urinary bladder unremarkable. Prostate gland and seminal vesicles normal for age. Six numerous pelvic phleboliths.  Degenerative disc disease and spondylosis involving the visualized lower thoracic and entire lumbar spine. Visualized lung bases clear apart from scarring in the lingula, right middle lobe and both lower lobes. Heart size upper normal.  Review of the MIP images confirms the above findings.  IMPRESSION: 1. No evidence of large vessel mesenteric ischemia. 2. Approximate 12 cm segment of the upper and mid rectum demonstrating diminished caliber relative to the lower rectum, hyperemia involving its mucosa and wall without associated wall thickening, and edema/inflammation in the surrounding fat, including the presacral space. This is a new finding since 09/2014 and likely indicates focal infectious colitis versus focal ischemic colitis. 3. Interval increase in size of a posterior segment right lobe liver  metastasis and development of a possible new anterior segment right lobe metastasis since 09/2014.   Electronically Signed   By: Evangeline Dakin M.D.   On: 11/30/2014 17:26      Medications:  Impression: Lung carcinoma with status post brain metastasis and radiation therapy to CNS 2 new and  Increasing liver metastasis Active Problems:   Syncope   Infectious colitis   UTI (urinary tract infection)     Plan: We'll obtain GI pathogen panel. Will start patient on clear liquids today. We'll monitor CBC and be met daily. We'll continue Cipro and Flagyl as ordered for colitis. GI consultation requested today for a.m. I have not requested any surgical consultation to extensive metastatic involvement  Consultants: GI requested 4 AM   Procedures   Antibiotics: Cipro and Flagyl IV                  Code Status: Will be addressed by primary care Dr. Willey Blade  Family Communication:  Lengthy discussion with brother and sister in hallway  Disposition Plan see plan above  Time spent: 45 minutes   LOS: 1 day   Jared Gill M   12/01/2014, 12:21 PM

## 2014-12-02 DIAGNOSIS — N390 Urinary tract infection, site not specified: Secondary | ICD-10-CM | POA: Diagnosis not present

## 2014-12-02 DIAGNOSIS — K6289 Other specified diseases of anus and rectum: Secondary | ICD-10-CM | POA: Insufficient documentation

## 2014-12-02 LAB — BASIC METABOLIC PANEL
Anion gap: 5 (ref 5–15)
BUN: 8 mg/dL (ref 6–20)
CHLORIDE: 106 mmol/L (ref 101–111)
CO2: 25 mmol/L (ref 22–32)
Calcium: 7.6 mg/dL — ABNORMAL LOW (ref 8.9–10.3)
Creatinine, Ser: 0.68 mg/dL (ref 0.61–1.24)
GFR calc Af Amer: >60 mL/min (ref >60)
GFR calc non Af Amer: >60 mL/min (ref >60)
GLUCOSE: 94 mg/dL (ref 65–99)
POTASSIUM: 4.1 mmol/L (ref 3.5–5.1)
Sodium: 136 mmol/L (ref 135–145)

## 2014-12-02 LAB — CBC WITH DIFFERENTIAL/PLATELET
Basophils Absolute: 0 10*3/uL (ref 0.0–0.1)
Basophils Relative: 0 %
EOS PCT: 0 %
Eosinophils Absolute: 0 10*3/uL (ref 0.0–0.7)
HEMATOCRIT: 37.9 % — AB (ref 39.0–52.0)
Hemoglobin: 12.9 g/dL — ABNORMAL LOW (ref 13.0–17.0)
LYMPHS ABS: 0.7 10*3/uL (ref 0.7–4.0)
LYMPHS PCT: 7 %
MCH: 24.6 pg — ABNORMAL LOW (ref 26.0–34.0)
MCHC: 34 g/dL (ref 30.0–36.0)
MCV: 72.2 fL — AB (ref 78.0–100.0)
MONO ABS: 0.9 10*3/uL (ref 0.1–1.0)
MONOS PCT: 10 %
Neutro Abs: 7.9 10*3/uL — ABNORMAL HIGH (ref 1.7–7.7)
Neutrophils Relative %: 83 %
Platelets: 77 10*3/uL — ABNORMAL LOW (ref 150–400)
RBC: 5.25 MIL/uL (ref 4.22–5.81)
RDW: 18.2 % — AB (ref 11.5–15.5)
Smear Review: DECREASED
WBC: 9.5 10*3/uL (ref 4.0–10.5)

## 2014-12-02 LAB — GLUCOSE, CAPILLARY: Glucose-Capillary: 96 mg/dL (ref 65–99)

## 2014-12-02 MED ORDER — FOLIC ACID 1 MG PO TABS: ORAL_TABLET | ORAL | Status: DC

## 2014-12-02 MED ORDER — DEXAMETHASONE 4 MG PO TABS: ORAL_TABLET | ORAL | Status: DC

## 2014-12-02 NOTE — Consult Note (Signed)
Referring Provider: Dr. Cindie Laroche Primary Care Physician:  Asencion Noble, MD Primary Gastroenterologist:  Dr. Oneida Alar   Date of Admission: 12/01/14 Date of Consultation: 12/02/14  Reason for Consultation:  Colitis   HPI:  Jared Gill is a 74 y.o. 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 Whitney Muse, presenting to the ED after syncopal episode on Saturday.  CTA as below, with high-grade stenosis at origin of IMA but SMA widely patent and celiac artery patent with only mild origin stenosis. Infectious vs focal ischemic etiology noted at upper and mid rectum, and interval increase in right lobe liver metastasis and possible new development of additional anterior right lobe metastasis. GI pathogen panel is in process. Cipro/Flagyl started 11/30/14 empirically.   Past GI history significant for constipation, GERD, gastroparesis. Brother at bedside, Birdena Crandall, providing majority of information. Notes patient was constipated for several days prior to Saturday, with only small amounts of stool. [place] gave the patient Miralax on Friday. Saturday had large stool and diarrhea. Brother states that patient had syncopal episode after getting up from toilet. Found in the floor.  3 stools yesterday. Nursing was not given any information in report about persistent diarrhea. Patient and brother deny any stool today. No rectal bleeding or abdominal pain. Syncopal episode after getting up from toilet. No recent antibiotics. Patient states "all I want to do is get my sight back". Blurred vision is main concern of patient, recent development and secondary to known brain mass.   Historically, he has taken Linzess every M, W, Friday. Last seen by Dr. Oneida Alar in June 2016. Linzess not on medication list currently.   Past Medical History  Diagnosis Date  . Hypertension   . Abnormal MRI, spine 05/12/2011  . Nausea 01/2014  . BPH (benign prostatic hypertrophy)   .  Hx of adenomatous colonic polyps     h/o tubulovillous adenomas  . Diverticulitis   . Lung mass     Right Upper Lobe  . GERD (gastroesophageal reflux disease)   . Blurred vision   . Brain metastasis ([place])   . Metastasis to brain ([place])   . Brain metastases ([place])   . Brain cancer ([place])   . Lung cancer ([place])   . Brain cancer [facility]) Brain cancer    Past Surgical History  Procedure Laterality Date  . Cholecystectomy  2009  . Polyp removal via colonoscopy    . Colonoscopy with esophagogastroduodenoscopy (egd) N/A 04/06/2013    ATF:TDDUKGUR size internal hemorrhoids/moderate diverticulosis/medium sized HH  . Tonsilectomy, adenoidectomy, bilateral myringotomy and tubes    . Video bronchoscopy with endobronchial ultrasound N/A 10/29/2014    Procedure: ATTEMPTED VIDEO BRONCHOSCOPY WITH ENDOBRONCHIAL ULTRASOUND;  Surgeon: Javier Glazier, MD;  Location: [facility];  Service: Thoracic;  Laterality: N/A;  . Mediastinoscopy N/A 11/07/2014    Procedure: MEDIASTINOSCOPY;  Surgeon: Melrose Nakayama, MD;  Location: [facility];  Service: Thoracic;  Laterality: N/A;    Prior to Admission medications   Medication Sig Start Date End Date Taking? Authorizing Provider  dexamethasone (DECADRON) 4 MG tablet Start taking 1 tablet ('4mg'$ ) twice daily with food. On Oct 7, continue to taper as instructed. Patient taking differently: Take 4 mg by mouth daily. continue to taper as instructed. 11/15/14  Yes Eppie Gibson, MD  LORazepam (ATIVAN) 1 MG tablet Take 1 mg by mouth 2 (two) times daily as needed for anxiety.  11/11/14  Yes Historical Provider, MD  metoprolol tartrate (LOPRESSOR) 25 MG tablet  Take 1 tablet (25 mg total) by mouth 2 (two) times daily. 11/08/14  Yes Coolidge Breeze, PA-C  omeprazole (PRILOSEC) 20 MG capsule 1 PO 30 MINS PRIOR TO BREAKFAST AND AT BEDTIME 08/22/14  Yes Danie Binder, MD  ondansetron (ZOFRAN ODT) 4 MG disintegrating tablet Take 1 tablet (4 mg total) by mouth every 6 (six) hours as needed for  nausea. Patient taking differently: Take 4 mg by mouth at bedtime.  05/09/14  Yes Danie Binder, MD  polyethylene glycol powder (GLYCOLAX/MIRALAX) powder Take 17 g by mouth daily.   Yes Historical Provider, MD    Current Facility-Administered Medications  Medication Dose Route Frequency Provider Last Rate Last Dose  . 0.9 %  sodium chloride infusion   Intravenous Continuous Asencion Noble, MD 50 mL/hr at 12/02/14 0735    . alum & mag hydroxide-simeth (MAALOX/MYLANTA) 200-200-20 MG/5ML suspension 30 mL  30 mL Oral Q6H PRN Tanna Savoy Stinson, DO      . ciprofloxacin (CIPRO) IVPB 400 mg  400 mg Intravenous Q12H Harvel Quale, MD 200 mL/hr at 12/01/14 2244 400 mg at 12/01/14 2244  . dexamethasone (DECADRON) tablet 4 mg  4 mg Oral Daily Tanna Savoy Stinson, DO   4 mg at 12/01/14 1301  . enoxaparin (LOVENOX) injection 40 mg  40 mg Subcutaneous Q24H Tanna Savoy Stinson, DO   40 mg at 12/01/14 2243  . Influenza vac split quadrivalent PF (FLUARIX) injection 0.5 mL  0.5 mL Intramuscular Tomorrow-1000 Asencion Noble, MD      . LORazepam (ATIVAN) tablet 1 mg  1 mg Oral BID PRN Truett Mainland, DO   1 mg at 12/02/14 1610  . metoprolol tartrate (LOPRESSOR) tablet 25 mg  25 mg Oral BID Tanna Savoy Stinson, DO   25 mg at 12/01/14 2243  . metroNIDAZOLE (FLAGYL) IVPB 500 mg  500 mg Intravenous 3 times per day Harvel Quale, MD 100 mL/hr at 12/02/14 0601 500 mg at 12/02/14 0601  . ondansetron (ZOFRAN) tablet 4 mg  4 mg Oral Q6H PRN Tanna Savoy Stinson, DO       Or  . ondansetron [facility]) injection 4 mg  4 mg Intravenous Q6H PRN Tanna Savoy Stinson, DO      . pantoprazole (PROTONIX) EC tablet 80 mg  80 mg Oral Daily Tanna Savoy Stinson, DO   80 mg at 12/01/14 1100  . pneumococcal 23 valent vaccine (PNU-IMMUNE) injection 0.5 mL  0.5 mL Intramuscular Tomorrow-1000 Asencion Noble, MD      . sodium chloride 0.9 % injection 3 mL  3 mL Intravenous Q12H Tanna Savoy Stinson, DO   3 mL at 12/01/14 2245    Allergies as of 11/30/2014 - Review Complete 11/30/2014   Allergen Reaction Noted  . Reglan [metoclopramide]  05/09/2014    Family History  Problem Relation Age of Onset  . Diabetes Father   . Diabetes Sister   . Lung cancer Brother   . CVA Brother   . Colon cancer Neg Hx   . CAD Brother   . Heart attack Neg Hx   . Hypertension Brother   . Stroke Brother     Social History   Social History  . Marital Status: Single    Spouse Name: N/A  . Number of Children: 0  . Years of Education: N/A   Occupational History  . retired    Social History Main Topics  . Smoking status: Former Smoker -- 0.50 packs/day for 40 years    Types:  Cigarettes    Quit date: 10/17/2009  . Smokeless tobacco: Never Used  . Alcohol Use: No     Comment: Remote EtOH  . Drug Use: No  . Sexual Activity: Not on file   Other Topics Concern  . Not on file   Social History Narrative   Originally from [place]. Previously worked as a Training and development officer at [employer]. He worked in the [employer]. He previously worked Audiological scientist for [employer] Has no pets currently. No mold exposure. No asbestos exposure.     Review of Systems: Gen: see HPI  CV: Denies chest pain, heart palpitations, syncope, edema  Resp: Denies shortness of breath with rest, cough, wheezing GI: see HPI GU : Denies urinary burning, urinary frequency, urinary incontinence.  MS: Denies joint pain,swelling, cramping Derm: Denies rash, itching, dry skin Psych: intermittent depression  Heme: Denies bruising, bleeding, and enlarged lymph nodes.  Physical Exam: Vital signs in last 24 hours: Temp:  [97.4 F (36.3 C)-97.8 F (36.6 C)] 97.4 F (36.3 C) (10/10 0540) Pulse Rate:  [74-82] 74 (10/10 0540) Resp:  [18-20] 20 (10/10 0540) BP: (121-129)/(69-81) 129/72 mmHg (10/10 0540) SpO2:  [99 %-100 %] 99 % (10/10 0540) Weight:  [229 lb 15 oz (104.3 kg)] 229 lb 15 oz (104.3 kg) (10/10 0540) Last BM Date: 11/30/14 General:   Alert,  Well-developed, well-nourished, pleasant and cooperative  in NAD Head:  Normocephalic and atraumatic. Eyes:  Sclera clear, no icterus.   Conjunctiva pink. Ears:  Normal auditory acuity. Nose:  No deformity, discharge,  or lesions. Mouth:  No deformity or lesions, dentition normal. Lungs:  Clear throughout to auscultation.   No wheezes, crackles, or rhonchi. No acute distress. Heart:  Regular rate and rhythm; no murmurs, clicks, rubs,  or gallops. Abdomen:  Soft, nontender and nondistended. No masses, hepatosplenomegaly or hernias noted. Normal bowel sounds, without guarding, and without rebound.   Rectal:  Deferred  Msk:  Symmetrical without gross deformities. Normal posture. Pulses:  Normal pulses noted. Extremities:  Without clubbing or edema. Neurologic:  Alert and  oriented x4 Skin:  Intact without significant lesions or rashes. Psych:  Alert and cooperative. Flat affect   Intake/Output from previous day: 10/09 0701 - 10/10 0700 In: 3345.8 [P.O.:400; I.V.:2245.8; IV Piggyback:700] Out: 1850 [Urine:1850] Intake/Output this shift:    Lab Results:  Recent Labs  11/30/14 1319 12/02/14 0516  WBC 10.3 9.5  HGB 14.5 12.9*  HCT 41.7 37.9*  PLT 64* 77*   BMET  Recent Labs  11/30/14 1319 12/01/14 0555 12/02/14 0516  NA 131* 135 136  K 3.9 4.1 4.1  CL 100* 106 106  CO2 '22 23 25  '$ GLUCOSE 186* 115* 94  BUN '17 9 8  '$ CREATININE 1.01 0.65 0.68  CALCIUM 7.7* 7.4* 7.6*   LFT  Recent Labs  11/30/14 1319  PROT 5.0*  ALBUMIN 2.7*  AST 31  ALT 39  ALKPHOS 41  BILITOT 0.8    Studies/Results: Ct Head Wo Contrast  11/30/2014   CLINICAL DATA:  Syncope and fall off toilet this morning. Positive loss of consciousness.  EXAM: CT HEAD WITHOUT CONTRAST  CT CERVICAL SPINE WITHOUT CONTRAST  TECHNIQUE: Multidetector CT imaging of the head and cervical spine was performed following the standard protocol without intravenous contrast. Multiplanar CT image reconstructions of the cervical spine were also generated.  COMPARISON:  None.   FINDINGS: CT HEAD FINDINGS  Bony calvarium appears intact. Minimal bilateral maxillary sinusitis is noted. Mild diffuse cortical atrophy is noted.  No mass effect or midline shift is noted. Ventricular size is within normal limits. There is no evidence of mass lesion, hemorrhage or acute infarction.  CT CERVICAL SPINE FINDINGS  No fracture is noted. Minimal grade 1 retrolisthesis is noted at C4-5 and C5-6 secondary to degenerative disc disease at these levels. Anterior osteophyte formation is noted at these levels as well. Severe degenerative disc disease is noted at C6-7. No significant abnormality seen involving the visualized lung apices.  IMPRESSION: Minimal bilateral maxillary sinusitis. Mild diffuse cortical atrophy. No acute intracranial abnormality seen.  Severe multilevel degenerative disc disease is noted. No acute abnormality seen in the cervical spine.   Electronically Signed   By: Marijo Conception, M.D.   On: 11/30/2014 14:42   Ct Cervical Spine Wo Contrast  11/30/2014   CLINICAL DATA:  Syncope and fall off toilet this morning. Positive loss of consciousness.  EXAM: CT HEAD WITHOUT CONTRAST  CT CERVICAL SPINE WITHOUT CONTRAST  TECHNIQUE: Multidetector CT imaging of the head and cervical spine was performed following the standard protocol without intravenous contrast. Multiplanar CT image reconstructions of the cervical spine were also generated.  COMPARISON:  None.  FINDINGS: CT HEAD FINDINGS  Bony calvarium appears intact. Minimal bilateral maxillary sinusitis is noted. Mild diffuse cortical atrophy is noted. No mass effect or midline shift is noted. Ventricular size is within normal limits. There is no evidence of mass lesion, hemorrhage or acute infarction.  CT CERVICAL SPINE FINDINGS  No fracture is noted. Minimal grade 1 retrolisthesis is noted at C4-5 and C5-6 secondary to degenerative disc disease at these levels. Anterior osteophyte formation is noted at these levels as well. Severe  degenerative disc disease is noted at C6-7. No significant abnormality seen involving the visualized lung apices.  IMPRESSION: Minimal bilateral maxillary sinusitis. Mild diffuse cortical atrophy. No acute intracranial abnormality seen.  Severe multilevel degenerative disc disease is noted. No acute abnormality seen in the cervical spine.   Electronically Signed   By: Marijo Conception, M.D.   On: 11/30/2014 14:42   Dg Abd Acute W/chest  11/30/2014   CLINICAL DATA:  Loss of bowel control.  Mental status changes.  EXAM: DG ABDOMEN ACUTE W/ 1V CHEST  COMPARISON:  11/07/2014  FINDINGS: There is a moderate amount of gas within small and large bowel but the pattern does not suggest ileus or obstruction. No sign of free air. There is interposition of colon between the liver and the diaphragm. Clips in the right upper quadrant consistent with previous cholecystectomy. Vascular calcification.  One-view chest shows normal heart size. There is mild volume loss in the left lower lobe. There are made of the chest is clear. No free air seen.  IMPRESSION: No acute intra abdominal finding. Gas within small and large bowel but no evidence of ileus, obstruction or free air. Probable mild atelectasis in the left lower lobe.   Electronically Signed   By: Nelson Chimes M.D.   On: 11/30/2014 14:38   Ct Angio Abd/pel W/ And/or W/o  11/30/2014   CLINICAL DATA:  74 year old with recent diagnosis of metastatic lung cancer involving the right upper lobe. Patient presents with an episode of syncope while on the toilet earlier today and has a markedly elevated serum lactic acid. Concern for mesenteric ischemia.  EXAM: CTA ABDOMEN AND PELVIS WITHOUT CONTRAST  TECHNIQUE: Multidetector CT imaging of the abdomen and pelvis was performed using the standard protocol during bolus administration of intravenous contrast. Multiplanar reconstructed images and MIPs  were obtained and reviewed to evaluate the vascular anatomy.  CONTRAST:  11m  OMNIPAQUE IOHEXOL 350 MG/ML IV.  COMPARISON:  PET-CT 10/21/2014. CT abdomen and pelvis 10/10/2014 dating back to 04/14/2013.  FINDINGS: Celiac artery patent with mild origin stenosis. SMA widely patent, and no branch stenoses or occlusions are visible. 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 Single bilateral renal arteries with probable high-grade stenosis involving the origin of the left renal artery  Since the most recent prior CT 10/10/2014, interval increase in size of a posterior segment right lobe liver metastasis now measuring 1.7 cm (series 8, image 28). New vague approximate 0.8 cm anterior segment right lobe liver metastasis (image 23). Normal-appearing spleen and adrenal glands. Atrophy involving the pancreatic body and tail without focal pancreatic parenchymal abnormality. Gallbladder surgically absent. No biliary ductal dilation. Exophytic simple cyst arising from the lower pole of the right kidney as noted previously; no significant abnormality involving either kidney.  Since the 10/10/2014 CT, development of presacral edema and edema surrounding the upper and mid rectum with hyperemia involving the wall of this segment of the rectum which measures approximately 12 cm in length. This segment of the rectum is of diminished caliber relative to the lower rectum. No associated wall thickening. Descending and sigmoid colon diverticulosis without evidence of acute diverticulitis. No evidence of colonic obstruction.  Stomach normal in appearance for degree of distention. Normal-appearing small bowel. Cecum positioned in the right upper quadrant, and normal-appearing gas-filled appendix identified in the mid abdomen extending almost to midline. No ascites.  Urinary bladder unremarkable. Prostate gland and seminal vesicles normal for age. Six numerous pelvic phleboliths.  Degenerative disc  disease and spondylosis involving the visualized lower thoracic and entire lumbar spine. Visualized lung bases clear apart from scarring in the lingula, right middle lobe and both lower lobes. Heart size upper normal.  Review of the MIP images confirms the above findings.  IMPRESSION: 1. No evidence of large vessel mesenteric ischemia. 2. Approximate 12 cm segment of the upper and mid rectum demonstrating diminished caliber relative to the lower rectum, hyperemia involving its mucosa and wall without associated wall thickening, and edema/inflammation in the surrounding fat, including the presacral space. This is a new finding since 09/2014 and likely indicates focal infectious colitis versus focal ischemic colitis. 3. Interval increase in size of a posterior segment right lobe liver metastasis and development of a possible new anterior segment right lobe metastasis since 09/2014.   Electronically Signed   By: TEvangeline DakinM.D.   On: 11/30/2014 17:26    Impression: 74year old male admitted with syncopal episode after bowel movement, with findings of questionable proctitis on CT scan. Appears patient had been constipated prior to episode, given Miralax, and had large BM with subsequent diarrhea. Reviewed CTA findings with Dr. DArdeen Garland who states lower rectum with stool burden, some rectal mucosa enhancing and possible small stranding, consistent with original reading of very mild proctitis. Non-specific at this point, and large episode of diarrhea likely secondary to laxative use after significant constipation. GI pathogen panel is pending, and we will follow up on this as available. Could continue short course of Cipro and Flagyl empirically. Clinically, patient has improved from a GI standpoint since admission.   Plan: Await GI pathogen panel findings Consider restarting Linzess as outpatient once current episode resolved Cipro and Flagyl empirically for short course Low residue diet  AOrvil Feil  ANP-BC [facility]     LOS: 2 days    12/02/2014, 7:54 AM

## 2014-12-02 NOTE — Progress Notes (Signed)
Verified with pharmacy that pt okay to receive vaccinations.  Pt has been afebrile and WBC 9.5, okay to give at time, will continue to monitor.

## 2014-12-02 NOTE — Progress Notes (Signed)
Subjective: [name] was admitted after a syncopal episode at home which occurred upon getting up to go to the bathroom. He had a CT scan of the brain which revealed no mass or hemorrhage. 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 He had recently been constipated and had been treated with Mira lax. After arrival he had loose stools. He was evaluated with a CT scan of the abdomen. This was consistent with an area of colitis. He denies any abdominal pain now. He denies any fever. He has been hemodynamically stable. White count is normal. His primary complaint is his persistent blurry vision now.  Objective: Vital signs in last 24 hours: Filed Vitals:   12/01/14 0629 12/01/14 1347 12/01/14 2211 12/02/14 0540  BP: 118/79 121/81 123/69 129/72  Pulse: 79 82 75 74  Temp: 97.9 F (36.6 C) 97.8 F (36.6 C) 97.5 F (36.4 C) 97.4 F (36.3 C)  TempSrc: Axillary Oral Oral Oral  Resp: '18 18 20 20  '$ Height: '6\' 2"'$  (1.88 m)     Weight: 228 lb 9.9 oz (103.7 kg)   229 lb 15 oz (104.3 kg)  SpO2: 100% 100% 100% 99%   Weight change: 2 lb 3.3 oz (1 kg)  Intake/Output Summary (Last 24 hours) at 12/02/14 0735 Last data filed at 12/02/14 0543  Gross per 24 hour  Intake 3345.83 ml  Output   1850 ml  Net 1495.83 ml    Physical Exam: Alert. No distress. Lungs clear. Heart regular with no murmurs. Abdomen is soft, nondistended and nontender with no palpable organomegaly. Extremities reveal no edema.  Lab Results:    Results for orders placed or performed during the hospital encounter of 11/30/14 (from the past 24 hour(s))  Basic metabolic panel     Status: Abnormal   Collection Time: 12/02/14  5:16 AM  Result Value Ref Range   Sodium 136 135 - 145 mmol/L   Potassium 4.1 3.5 - 5.1 mmol/L   Chloride 106 101 - 111 mmol/L   CO2 25 22 - 32 mmol/L   Glucose, Bld 94 65 - 99 mg/dL   BUN 8 6 - 20 mg/dL   Creatinine, Ser 0.68 0.61 -  1.24 mg/dL   Calcium 7.6 (L) 8.9 - 10.3 mg/dL   GFR calc non Af Amer >60 >60 mL/min   GFR calc Af Amer >60 >60 mL/min   Anion gap 5 5 - 15  CBC with Differential/Platelet     Status: Abnormal   Collection Time: 12/02/14  5:16 AM  Result Value Ref Range   WBC 9.5 4.0 - 10.5 K/uL   RBC 5.25 4.22 - 5.81 MIL/uL   Hemoglobin 12.9 (L) 13.0 - 17.0 g/dL   HCT 37.9 (L) 39.0 - 52.0 %   MCV 72.2 (L) 78.0 - 100.0 fL   MCH 24.6 (L) 26.0 - 34.0 pg   MCHC 34.0 30.0 - 36.0 g/dL   RDW 18.2 (H) 11.5 - 15.5 %   Platelets 77 (L) 150 - 400 K/uL   Neutrophils Relative % 83 %   Neutro Abs 7.9 (H) 1.7 - 7.7 K/uL   Lymphocytes Relative 7 %   Lymphs Abs 0.7 0.7 - 4.0 K/uL   Monocytes Relative 10 %   Monocytes Absolute 0.9 0.1 - 1.0 K/uL   Eosinophils Relative 0 %   Eosinophils Absolute 0.0 0.0 - 0.7 K/uL   Basophils Relative 0 %   Basophils Absolute 0.0 0.0 -  0.1 K/uL   Smear Review PLATELETS APPEAR DECREASED   Glucose, capillary     Status: None   Collection Time: 12/02/14  6:45 AM  Result Value Ref Range   Glucose-Capillary 96 65 - 99 mg/dL   Comment 1 Document in Chart      ABGS No results for input(s): PHART, PO2ART, TCO2, HCO3 in the last 72 hours.  Invalid input(s): PCO2 CULTURES Recent Results (from the past 240 hour(s))  Blood culture (routine x 2)     Status: None (Preliminary result)   Collection Time: 11/30/14  2:34 PM  Result Value Ref Range Status   Specimen Description BLOOD LEFT ANTECUBITAL  Final   Special Requests BOTTLES DRAWN AEROBIC AND ANAEROBIC 4CC  Final   Culture PENDING  Incomplete   Report Status PENDING  Incomplete  Culture, blood (routine x 2)     Status: None (Preliminary result)   Collection Time: 11/30/14  2:37 PM  Result Value Ref Range Status   Specimen Description BLOOD BLOOD LEFT HAND  Final   Special Requests BOTTLES DRAWN AEROBIC AND ANAEROBIC 4CC  Final   Culture PENDING  Incomplete   Report Status PENDING  Incomplete   Studies/Results: Ct Head Wo  Contrast  11/30/2014   CLINICAL DATA:  Syncope and fall off toilet this morning. Positive loss of consciousness.  EXAM: CT HEAD WITHOUT CONTRAST  CT CERVICAL SPINE WITHOUT CONTRAST  TECHNIQUE: Multidetector CT imaging of the head and cervical spine was performed following the standard protocol without intravenous contrast. Multiplanar CT image reconstructions of the cervical spine were also generated.  COMPARISON:  None.  FINDINGS: CT HEAD FINDINGS  Bony calvarium appears intact. Minimal bilateral maxillary sinusitis is noted. Mild diffuse cortical atrophy is noted. No mass effect or midline shift is noted. Ventricular size is within normal limits. There is no evidence of mass lesion, hemorrhage or acute infarction.  CT CERVICAL SPINE FINDINGS  No fracture is noted. Minimal grade 1 retrolisthesis is noted at C4-5 and C5-6 secondary to degenerative disc disease at these levels. Anterior osteophyte formation is noted at these levels as well. Severe degenerative disc disease is noted at C6-7. No significant abnormality seen involving the visualized lung apices.  IMPRESSION: Minimal bilateral maxillary sinusitis. Mild diffuse cortical atrophy. No acute intracranial abnormality seen.  Severe multilevel degenerative disc disease is noted. No acute abnormality seen in the cervical spine.   Electronically Signed   By: Marijo Conception, M.D.   On: 11/30/2014 14:42   Ct Cervical Spine Wo Contrast  11/30/2014   CLINICAL DATA:  Syncope and fall off toilet this morning. Positive loss of consciousness.  EXAM: CT HEAD WITHOUT CONTRAST  CT CERVICAL SPINE WITHOUT CONTRAST  TECHNIQUE: Multidetector CT imaging of the head and cervical spine was performed following the standard protocol without intravenous contrast. Multiplanar CT image reconstructions of the cervical spine were also generated.  COMPARISON:  None.  FINDINGS: CT HEAD FINDINGS  Bony calvarium appears intact. Minimal bilateral maxillary sinusitis is noted. Mild diffuse  cortical atrophy is noted. No mass effect or midline shift is noted. Ventricular size is within normal limits. There is no evidence of mass lesion, hemorrhage or acute infarction.  CT CERVICAL SPINE FINDINGS  No fracture is noted. Minimal grade 1 retrolisthesis is noted at C4-5 and C5-6 secondary to degenerative disc disease at these levels. Anterior osteophyte formation is noted at these levels as well. Severe degenerative disc disease is noted at C6-7. No significant abnormality seen involving the visualized lung  apices.  IMPRESSION: Minimal bilateral maxillary sinusitis. Mild diffuse cortical atrophy. No acute intracranial abnormality seen.  Severe multilevel degenerative disc disease is noted. No acute abnormality seen in the cervical spine.   Electronically Signed   By: Marijo Conception, M.D.   On: 11/30/2014 14:42   Dg Abd Acute W/chest  11/30/2014   CLINICAL DATA:  Loss of bowel control.  Mental status changes.  EXAM: DG ABDOMEN ACUTE W/ 1V CHEST  COMPARISON:  11/07/2014  FINDINGS: There is a moderate amount of gas within small and large bowel but the pattern does not suggest ileus or obstruction. No sign of free air. There is interposition of colon between the liver and the diaphragm. Clips in the right upper quadrant consistent with previous cholecystectomy. Vascular calcification.  One-view chest shows normal heart size. There is mild volume loss in the left lower lobe. There are made of the chest is clear. No free air seen.  IMPRESSION: No acute intra abdominal finding. Gas within small and large bowel but no evidence of ileus, obstruction or free air. Probable mild atelectasis in the left lower lobe.   Electronically Signed   By: Nelson Chimes M.D.   On: 11/30/2014 14:38   Ct Angio Abd/pel W/ And/or W/o  11/30/2014   CLINICAL DATA:  74 year old with recent diagnosis of metastatic lung cancer involving the right upper lobe. Patient presents with an episode of syncope while on the toilet  earlier today and has a markedly elevated serum lactic acid. Concern for mesenteric ischemia.  EXAM: CTA ABDOMEN AND PELVIS WITHOUT CONTRAST  TECHNIQUE: Multidetector CT imaging of the abdomen and pelvis was performed using the standard protocol during bolus administration of intravenous contrast. Multiplanar reconstructed images and MIPs were obtained and reviewed to evaluate the vascular anatomy.  CONTRAST:  152m OMNIPAQUE IOHEXOL 350 MG/ML IV.  COMPARISON:  PET-CT 10/21/2014. CT abdomen and pelvis 10/10/2014 dating back to 04/14/2013.  FINDINGS: Celiac artery patent with mild origin stenosis. SMA widely patent, and no branch stenoses or occlusions are visible. 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 Single bilateral renal arteries with probable high-grade stenosis involving the origin of the left renal artery  Since the most recent prior CT 10/10/2014, interval increase in size of a posterior segment right lobe liver metastasis now measuring 1.7 cm (series 8, image 28). New vague approximate 0.8 cm anterior segment right lobe liver metastasis (image 23). Normal-appearing spleen and adrenal glands. Atrophy involving the pancreatic body and tail without focal pancreatic parenchymal abnormality. Gallbladder surgically absent. No biliary ductal dilation. Exophytic simple cyst arising from the lower pole of the right kidney as noted previously; no significant abnormality involving either kidney.  Since the 10/10/2014 CT, development of presacral edema and edema surrounding the upper and mid rectum with hyperemia involving the wall of this segment of the rectum which measures approximately 12 cm in length. This segment of the rectum is of diminished caliber relative to the lower rectum. No associated wall thickening. Descending and sigmoid colon diverticulosis without evidence of acute diverticulitis.  No evidence of colonic obstruction.  Stomach normal in appearance for degree of distention. Normal-appearing small bowel. Cecum positioned in the right upper quadrant, and normal-appearing gas-filled appendix identified in the mid abdomen extending almost to midline. No ascites.  Urinary bladder unremarkable. Prostate gland and seminal vesicles normal for age. Six numerous pelvic phleboliths.  Degenerative disc disease and  spondylosis involving the visualized lower thoracic and entire lumbar spine. Visualized lung bases clear apart from scarring in the lingula, right middle lobe and both lower lobes. Heart size upper normal.  Review of the MIP images confirms the above findings.  IMPRESSION: 1. No evidence of large vessel mesenteric ischemia. 2. Approximate 12 cm segment of the upper and mid rectum demonstrating diminished caliber relative to the lower rectum, hyperemia involving its mucosa and wall without associated wall thickening, and edema/inflammation in the surrounding fat, including the presacral space. This is a new finding since 09/2014 and likely indicates focal infectious colitis versus focal ischemic colitis. 3. Interval increase in size of a posterior segment right lobe liver metastasis and development of a possible new anterior segment right lobe metastasis since 09/2014.   Electronically Signed   By: Evangeline Dakin M.D.   On: 11/30/2014 17:26   Micro Results: Recent Results (from the past 240 hour(s))  Blood culture (routine x 2)     Status: None (Preliminary result)   Collection Time: 11/30/14  2:34 PM  Result Value Ref Range Status   Specimen Description BLOOD LEFT ANTECUBITAL  Final   Special Requests BOTTLES DRAWN AEROBIC AND ANAEROBIC 4CC  Final   Culture PENDING  Incomplete   Report Status PENDING  Incomplete  Culture, blood (routine x 2)     Status: None (Preliminary result)   Collection Time: 11/30/14  2:37 PM  Result Value Ref Range Status   Specimen Description BLOOD BLOOD  LEFT HAND  Final   Special Requests BOTTLES DRAWN AEROBIC AND ANAEROBIC 4CC  Final   Culture PENDING  Incomplete   Report Status PENDING  Incomplete   Studies/Results: Ct Head Wo Contrast  11/30/2014   CLINICAL DATA:  Syncope and fall off toilet this morning. Positive loss of consciousness.  EXAM: CT HEAD WITHOUT CONTRAST  CT CERVICAL SPINE WITHOUT CONTRAST  TECHNIQUE: Multidetector CT imaging of the head and cervical spine was performed following the standard protocol without intravenous contrast. Multiplanar CT image reconstructions of the cervical spine were also generated.  COMPARISON:  None.  FINDINGS: CT HEAD FINDINGS  Bony calvarium appears intact. Minimal bilateral maxillary sinusitis is noted. Mild diffuse cortical atrophy is noted. No mass effect or midline shift is noted. Ventricular size is within normal limits. There is no evidence of mass lesion, hemorrhage or acute infarction.  CT CERVICAL SPINE FINDINGS  No fracture is noted. Minimal grade 1 retrolisthesis is noted at C4-5 and C5-6 secondary to degenerative disc disease at these levels. Anterior osteophyte formation is noted at these levels as well. Severe degenerative disc disease is noted at C6-7. No significant abnormality seen involving the visualized lung apices.  IMPRESSION: Minimal bilateral maxillary sinusitis. Mild diffuse cortical atrophy. No acute intracranial abnormality seen.  Severe multilevel degenerative disc disease is noted. No acute abnormality seen in the cervical spine.   Electronically Signed   By: Marijo Conception, M.D.   On: 11/30/2014 14:42   Ct Cervical Spine Wo Contrast  11/30/2014   CLINICAL DATA:  Syncope and fall off toilet this morning. Positive loss of consciousness.  EXAM: CT HEAD WITHOUT CONTRAST  CT CERVICAL SPINE WITHOUT CONTRAST  TECHNIQUE: Multidetector CT imaging of the head and cervical spine was performed following the standard protocol without intravenous contrast. Multiplanar CT image  reconstructions of the cervical spine were also generated.  COMPARISON:  None.  FINDINGS: CT HEAD FINDINGS  Bony calvarium appears intact. Minimal bilateral maxillary sinusitis is noted. Mild diffuse cortical  atrophy is noted. No mass effect or midline shift is noted. Ventricular size is within normal limits. There is no evidence of mass lesion, hemorrhage or acute infarction.  CT CERVICAL SPINE FINDINGS  No fracture is noted. Minimal grade 1 retrolisthesis is noted at C4-5 and C5-6 secondary to degenerative disc disease at these levels. Anterior osteophyte formation is noted at these levels as well. Severe degenerative disc disease is noted at C6-7. No significant abnormality seen involving the visualized lung apices.  IMPRESSION: Minimal bilateral maxillary sinusitis. Mild diffuse cortical atrophy. No acute intracranial abnormality seen.  Severe multilevel degenerative disc disease is noted. No acute abnormality seen in the cervical spine.   Electronically Signed   By: Marijo Conception, M.D.   On: 11/30/2014 14:42   Dg Abd Acute W/chest  11/30/2014   CLINICAL DATA:  Loss of bowel control.  Mental status changes.  EXAM: DG ABDOMEN ACUTE W/ 1V CHEST  COMPARISON:  11/07/2014  FINDINGS: There is a moderate amount of gas within small and large bowel but the pattern does not suggest ileus or obstruction. No sign of free air. There is interposition of colon between the liver and the diaphragm. Clips in the right upper quadrant consistent with previous cholecystectomy. Vascular calcification.  One-view chest shows normal heart size. There is mild volume loss in the left lower lobe. There are made of the chest is clear. No free air seen.  IMPRESSION: No acute intra abdominal finding. Gas within small and large bowel but no evidence of ileus, obstruction or free air. Probable mild atelectasis in the left lower lobe.   Electronically Signed   By: Nelson Chimes M.D.   On: 11/30/2014 14:38   Ct Angio Abd/pel W/ And/or  W/o  11/30/2014   CLINICAL DATA:  74 year old with recent diagnosis of metastatic lung cancer involving the right upper lobe. Patient presents with an episode of syncope while on the toilet earlier today and has a markedly elevated serum lactic acid. Concern for mesenteric ischemia.  EXAM: CTA ABDOMEN AND PELVIS WITHOUT CONTRAST  TECHNIQUE: Multidetector CT imaging of the abdomen and pelvis was performed using the standard protocol during bolus administration of intravenous contrast. Multiplanar reconstructed images and MIPs were obtained and reviewed to evaluate the vascular anatomy.  CONTRAST:  181m OMNIPAQUE IOHEXOL 350 MG/ML IV.  COMPARISON:  PET-CT 10/21/2014. CT abdomen and pelvis 10/10/2014 dating back to 04/14/2013.  FINDINGS: Celiac artery patent with mild origin stenosis. SMA widely patent, and no branch stenoses or occlusions are visible. 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 Single bilateral renal arteries with probable high-grade stenosis involving the origin of the left renal artery  Since the most recent prior CT 10/10/2014, interval increase in size of a posterior segment right lobe liver metastasis now measuring 1.7 cm (series 8, image 28). New vague approximate 0.8 cm anterior segment right lobe liver metastasis (image 23). Normal-appearing spleen and adrenal glands. Atrophy involving the pancreatic body and tail without focal pancreatic parenchymal abnormality. Gallbladder surgically absent. No biliary ductal dilation. Exophytic simple cyst arising from the lower pole of the right kidney as noted previously; no significant abnormality involving either kidney.  Since the 10/10/2014 CT, development of presacral edema and edema surrounding the upper and mid rectum with hyperemia involving the wall of this segment of the rectum which measures approximately 12 cm in  length. This segment of the rectum is  of diminished caliber relative to the lower rectum. No associated wall thickening. Descending and sigmoid colon diverticulosis without evidence of acute diverticulitis. No evidence of colonic obstruction.  Stomach normal in appearance for degree of distention. Normal-appearing small bowel. Cecum positioned in the right upper quadrant, and normal-appearing gas-filled appendix identified in the mid abdomen extending almost to midline. No ascites.  Urinary bladder unremarkable. Prostate gland and seminal vesicles normal for age. Six numerous pelvic phleboliths.  Degenerative disc disease and spondylosis involving the visualized lower thoracic and entire lumbar spine. Visualized lung bases clear apart from scarring in the lingula, right middle lobe and both lower lobes. Heart size upper normal.  Review of the MIP images confirms the above findings.  IMPRESSION: 1. No evidence of large vessel mesenteric ischemia. 2. Approximate 12 cm segment of the upper and mid rectum demonstrating diminished caliber relative to the lower rectum, hyperemia involving its mucosa and wall without associated wall thickening, and edema/inflammation in the surrounding fat, including the presacral space. This is a new finding since 09/2014 and likely indicates focal infectious colitis versus focal ischemic colitis. 3. Interval increase in size of a posterior segment right lobe liver metastasis and development of a possible new anterior segment right lobe metastasis since 09/2014.   Electronically Signed   By: Evangeline Dakin M.D.   On: 11/30/2014 17:26   Medications:  I have reviewed the patient's current medications Scheduled Meds: . ciprofloxacin  400 mg Intravenous Q12H  . dexamethasone  4 mg Oral Daily  . enoxaparin (LOVENOX) injection  40 mg Subcutaneous Q24H  . Influenza vac split quadrivalent PF  0.5 mL Intramuscular Tomorrow-1000  . metoprolol tartrate  25 mg Oral BID  . metroNIDAZOLE   500 mg Intravenous 3 times per day  . pantoprazole  80 mg Oral Daily  . pneumococcal 23 valent vaccine  0.5 mL Intramuscular Tomorrow-1000  . sodium chloride  3 mL Intravenous Q12H   Continuous Infusions: . sodium chloride 125 mL/hr at 12/01/14 2244   PRN Meds:.alum & mag hydroxide-simeth, LORazepam, ondansetron **OR** ondansetron (ZOFRAN) IV   Assessment/Plan:  #1. Syncope. No recurrence. #2. Colitis. GI has been consulted. He has been treated with Cipro and Flagyl. #3. Metastatic lung cancer. He appears to have new metastases in the liver now. #4. Possible UTI. Culture pending. Active Problems:   Syncope   Infectious colitis   UTI (urinary tract infection)     LOS: 2 days   Amandamarie Feggins 12/02/2014, 7:35 AM

## 2014-12-02 NOTE — Patient Instructions (Signed)
Jared Gill   CHEMOTHERAPY INSTRUCTIONS   Premeds: Zofran - through the IV - for nausea/vomiting prevention/reduction. Dexamethasone - steroid - through the IV - given to reduce the risk of you having a skin rash from the Pemetrexed chemo. Dex can cause you to feel energized, nervous/anxious/jittery, make you have trouble sleeping, and/or make you feel hot/flushed in the face/neck and/or look pink/red in the face/neck. These side effects will pass as the Dex wears off. (takes 30 minutes to infuse)  Pemetrexed - Side Effects - bone marrow suppression (lowers white blood cells (fight infection), lowers red blood cells (make up your blood), lowers platelets (help blood to clot). Fatigue, nausea/vomiting, chest pain, shortness of breath. While taking Pemetrexed, we will have you take Folic acid at home. You will need to take Folic acid every day and for 21 days after the completion of Pemetrexed. We will also give you Vitamin B12 injections periodically during your treatments. You will also be taking a steroid (Dexamethasone) while taking your Pemetrexed treatments. You will take this the day before, day of, and day after Pemetrexed. This will decrease the incidence of skin rash. Take it whether you think you need it or not.    Pemetrexed takes 10 minutes to infuse.       (We also draw blood before each chemo treatment to make sure your liver, kidneys, white blood cells, red blood cells, and platelets are looking normal).   POTENTIAL SIDE EFFECTS OF TREATMENT: Increased Susceptibility to Infection, Vomiting, Constipation, Hair Thinning, Changes in Character of Skin and Nails (brittleness, dryness,etc.), Bone Marrow Suppression, Nausea and Diarrhea   EDUCATIONAL MATERIALS GIVEN AND REVIEWED: Chemotherapy and You booklet specific Instructions Sheets: Pemetrexed   SELF CARE ACTIVITIES WHILE ON CHEMOTHERAPY: Increase your fluid intake 48 hours prior to treatment and  drink at least 2 quarts per day after treatment., No alcohol intake., No aspirin or other medications unless approved by your oncologist., Eat foods that are light and easy to digest., Eat foods at cold or room temperature., No fried, fatty, or spicy foods immediately before or after treatment., Have teeth cleaned professionally before starting treatment. Keep dentures and partial plates clean., Use soft toothbrush and do not use mouthwashes that contain alcohol. Biotene is a good mouthwash that is available at most pharmacies or may be ordered by calling [phone]., Use warm salt water gargles (1 teaspoon salt per 1 quart warm water) before and after meals and at bedtime. Or you may rinse with 2 tablespoons of three -percent hydrogen peroxide mixed in eight ounces of water., Always use sunscreen with SPF (Sun Protection Factor) of 30 or higher., Use your nausea medication as directed to prevent nausea., Use your stool softener or laxative as directed to prevent constipation. and Use your anti-diarrheal medication as directed to stop diarrhea.   Please wash your hands for at least 30 seconds using warm soapy water. Handwashing is the #1 way to prevent the spread of germs. Stay away from sick people or people who are getting over a cold. If you develop respiratory systems such as green/yellow mucus production or productive cough or persistent cough let us know and we will see if you need an antibiotic. It is a good idea to keep a pair of gloves on when going into grocery stores/[employer] to decrease your risk of coming into contact with germs on the carts, etc. Carry alcohol hand gel with you at all times and use it frequently if out in  public. All foods need to be cooked thoroughly. No raw foods. No medium or undercooked meats, eggs. If your food is cooked medium well, it does not need to be hot pink or saturated with bloody liquid at all. Vegetables and fruits need to be washed/rinsed under the faucet with a  dish detergent before being consumed. You can eat raw fruits and vegetables unless we tell you otherwise but it would be best if you cooked them or bought frozen. Do not eat off of salad bars or hot bars unless you really trust the cleanliness of the restaurant. If you need dental work, please let Dr. Whitney Muse know before you go for your appointment so that we can coordinate the best possible time for you in regards to your chemo regimen. You need to also let your dentist know that you are actively taking chemo. We may need to do labs prior to your dental appointment. We also want your bowels moving at least every other day. If this is not happening, we need to know so that we can get you on a bowel regimen to help you go.    MEDICATIONS: You have been given prescriptions for the following medications:  Currently you have Zofran '4mg'$  ordered and you can take it every 6 hours as needed for nausea/vomiting. If you need to increase your Zofran to '8mg'$  you may do so but you will need to only take the '8mg'$  dosage every 8 hours as needed. (this medication can constipate)  Dexamethasone '4mg'$  tablet. The day before, day of, and day after chemo take 2 tabs in the am and 2 tabs in the pm. This medication is being given to reduce the risk of you having a skin rash from the Pemetrexed chemo.  Folic acid '1mg'$  tablet. Take 1 tablet everyday and for 21 days after the completion of Pemetrexed chemo. This medication works to reduce the risk of you having side effects such as nausea/vomiting from the Pemetrexed chemo.   Over-the-Counter Meds:  Miralax 1 capful in 8 oz of fluid daily. May increase to two times a day if needed. This is a stool softener. If this doesn't work proceed you can add:  Senokot S  - start with 1 tablet two times a day and increase to 4 tablets two times a day if needed. (total of 8 tablets in a 24 hour period). This is a stimulant laxative.   Call us if this does not help your bowels move.    Imodium '2mg'$  capsule. Take 2 capsules after the 1st loose stool and then 1 capsule every 2 hours until you go a total of 12 hours without having a loose stool. Call the [place] if loose stools continue.    SYMPTOMS TO REPORT AS SOON AS POSSIBLE AFTER TREATMENT:  FEVER GREATER THAN 100.5 F  CHILLS WITH OR WITHOUT FEVER  NAUSEA AND VOMITING THAT IS NOT CONTROLLED WITH YOUR NAUSEA MEDICATION  UNUSUAL SHORTNESS OF BREATH  UNUSUAL BRUISING OR BLEEDING  TENDERNESS IN MOUTH AND THROAT WITH OR WITHOUT PRESENCE OF ULCERS  URINARY PROBLEMS  BOWEL PROBLEMS  UNUSUAL RASH    Wear comfortable clothing and clothing appropriate for easy access to any Portacath or PICC line. Let us know if there is anything that we can do to make your therapy better!      I have been informed and understand all of the instructions given to me and have received a copy. I have been instructed to call the clinic [phone]  or my family physician as soon as possible for continued medical care, if indicated. I do not have any more questions at this time but understand that I may call the [facility] or the Patient Navigator at [phone] during office hours should I have questions or need assistance in obtaining follow-up care.           Ondansetron injection What is this medicine? ONDANSETRON (on DAN se tron) is used to treat nausea and vomiting caused by chemotherapy. It is also used to prevent or treat nausea and vomiting after surgery. This medicine may be used for other purposes; ask your health care provider or pharmacist if you have questions. What should I tell my health care provider before I take this medicine? They need to know if you have any of these conditions: -heart disease -history of irregular heartbeat -liver disease -low levels of magnesium or potassium in the blood -an unusual or allergic reaction to ondansetron, granisetron, other medicines, foods, dyes, or  preservatives -pregnant or trying to get pregnant -breast-feeding How should I use this medicine? This medicine is for infusion into a vein. It is given by a health care professional in a hospital or clinic setting. Talk to your pediatrician regarding the use of this medicine in children. Special care may be needed. Overdosage: If you think you have taken too much of this medicine contact a poison control center or emergency room at once. NOTE: This medicine is only for you. Do not share this medicine with others. What if I miss a dose? This does not apply. What may interact with this medicine? Do not take this medicine with any of the following medications: -apomorphine -certain medicines for fungal infections like fluconazole, itraconazole, ketoconazole, posaconazole, voriconazole -cisapride -dofetilide -dronedarone -pimozide -thioridazine -ziprasidone This medicine may also interact with the following medications: -carbamazepine -certain medicines for depression, anxiety, or psychotic disturbances -fentanyl -linezolid -MAOIs like Carbex, Eldepryl, Marplan, Nardil, and Parnate -methylene blue (injected into a vein) -other medicines that prolong the QT interval (cause an abnormal heart rhythm) -phenytoin -rifampicin -tramadol This list may not describe all possible interactions. Give your health care provider a list of all the medicines, herbs, non-prescription drugs, or dietary supplements you use. Also tell them if you smoke, drink alcohol, or use illegal drugs. Some items may interact with your medicine. What should I watch for while using this medicine? Your condition will be monitored carefully while you are receiving this medicine. What side effects may I notice from receiving this medicine? Side effects that you should report to your doctor or health care professional as soon as possible: -allergic reactions like skin rash, itching or hives, swelling of the face, lips, or  tongue -breathing problems -confusion -dizziness -fast or irregular heartbeat -feeling faint or lightheaded, falls -fever and chills -loss of balance or coordination -seizures -sweating -swelling of the hands and feet -tightness in the chest -tremors -unusually weak or tired Side effects that usually do not require medical attention (report to your doctor or health care professional if they continue or are bothersome): -constipation or diarrhea -headache This list may not describe all possible side effects. Call your doctor for medical advice about side effects. You may report side effects to FDA at 1-800-FDA-1088. Where should I keep my medicine? This drug is given in a hospital or clinic and will not be stored at home. NOTE: This sheet is a summary. It may not cover all possible information. If you have questions about this medicine, talk  to your doctor, pharmacist, or health care provider.    2016, Elsevier/Gold Standard. (2012-11-15 16:18:28) Pemetrexed injection What is this medicine? PEMETREXED (PEM e TREX ed) is a chemotherapy drug. This medicine affects cells that are rapidly growing, such as cancer cells and cells in your mouth and stomach. It is usually used to treat lung cancers like non-small cell lung cancer and mesothelioma. It may also be used to treat other cancers. This medicine may be used for other purposes; ask your health care provider or pharmacist if you have questions. What should I tell my health care provider before I take this medicine? They need to know if you have any of these conditions: -if you frequently drink alcohol containing beverages -infection (especially a virus infection such as chickenpox, cold sores, or herpes) -kidney disease -liver disease -low blood counts, like low platelets, red bloods, or white blood cells -an unusual or allergic reaction to pemetrexed, mannitol, other medicines, foods, dyes, or preservatives -pregnant or trying to  get pregnant -breast-feeding How should I use this medicine? This drug is given as an infusion into a vein. It is administered in a hospital or clinic by a specially trained health care professional. Talk to your pediatrician regarding the use of this medicine in children. Special care may be needed. Overdosage: If you think you have taken too much of this medicine contact a poison control center or emergency room at once. NOTE: This medicine is only for you. Do not share this medicine with others. What if I miss a dose? It is important not to miss your dose. Call your doctor or health care professional if you are unable to keep an appointment. What may interact with this medicine? -aspirin and aspirin-like medicines -medicines to increase blood counts like filgrastim, pegfilgrastim, sargramostim -methotrexate -NSAIDS, medicines for pain and inflammation, like ibuprofen or naproxen -probenecid -pyrimethamine -vaccines Talk to your doctor or health care professional before taking any of these medicines: -acetaminophen -aspirin -ibuprofen -ketoprofen -naproxen This list may not describe all possible interactions. Give your health care provider a list of all the medicines, herbs, non-prescription drugs, or dietary supplements you use. Also tell them if you smoke, drink alcohol, or use illegal drugs. Some items may interact with your medicine. What should I watch for while using this medicine? Visit your doctor for checks on your progress. This drug may make you feel generally unwell. This is not uncommon, as chemotherapy can affect healthy cells as well as cancer cells. Report any side effects. Continue your course of treatment even though you feel ill unless your doctor tells you to stop. In some cases, you may be given additional medicines to help with side effects. Follow all directions for their use. Call your doctor or health care professional for advice if you get a fever, chills or  sore throat, or other symptoms of a cold or flu. Do not treat yourself. This drug decreases your body's ability to fight infections. Try to avoid being around people who are sick. This medicine may increase your risk to bruise or bleed. Call your doctor or health care professional if you notice any unusual bleeding. Be careful brushing and flossing your teeth or using a toothpick because you may get an infection or bleed more easily. If you have any dental work done, tell your dentist you are receiving this medicine. Avoid taking products that contain aspirin, acetaminophen, ibuprofen, naproxen, or ketoprofen unless instructed by your doctor. These medicines may hide a fever. Call your doctor or  health care professional if you get diarrhea or mouth sores. Do not treat yourself. To protect your kidneys, drink water or other fluids as directed while you are taking this medicine. Men and women must use effective birth control while taking this medicine. You may also need to continue using effective birth control for a time after stopping this medicine. Do not become pregnant while taking this medicine. Tell your doctor right away if you think that you or your partner might be pregnant. There is a potential for serious side effects to an unborn child. Talk to your health care professional or pharmacist for more information. Do not breast-feed an infant while taking this medicine. This medicine may lower sperm counts. What side effects may I notice from receiving this medicine? Side effects that you should report to your doctor or health care professional as soon as possible: -allergic reactions like skin rash, itching or hives, swelling of the face, lips, or tongue -low blood counts - this medicine may decrease the number of white blood cells, red blood cells and platelets. You may be at increased risk for infections and bleeding. -signs of infection - fever or chills, cough, sore throat, pain or difficulty  passing urine -signs of decreased platelets or bleeding - bruising, pinpoint red spots on the skin, black, tarry stools, blood in the urine -signs of decreased red blood cells - unusually weak or tired, fainting spells, lightheadedness -breathing problems, like a dry cough -changes in emotions or moods -chest pain -confusion -diarrhea -high blood pressure -mouth or throat sores or ulcers -pain, swelling, warmth in the leg -pain on swallowing -swelling of the ankles, feet, hands -trouble passing urine or change in the amount of urine -vomiting -yellowing of the eyes or skin Side effects that usually do not require medical attention (report to your doctor or health care professional if they continue or are bothersome): -hair loss -loss of appetite -nausea -stomach upset This list may not describe all possible side effects. Call your doctor for medical advice about side effects. You may report side effects to FDA at 1-800-FDA-1088. Where should I keep my medicine? This drug is given in a hospital or clinic and will not be stored at home. NOTE: This sheet is a summary. It may not cover all possible information. If you have questions about this medicine, talk to your doctor, pharmacist, or health care provider.    2016, Elsevier/Gold Standard. (2007-09-12 13:24:03) Dexamethasone tablets What is this medicine? DEXAMETHASONE (dex a METH a sone) is a corticosteroid. It is commonly used to treat inflammation of the skin, joints, lungs, and other organs. Common conditions treated include asthma, allergies, and arthritis. It is also used for other conditions, such as blood disorders and diseases of the adrenal glands. This medicine may be used for other purposes; ask your health care provider or pharmacist if you have questions. What should I tell my health care provider before I take this medicine? They need to know if you have any of these conditions: -Cushing's  syndrome -diabetes -glaucoma -heart problems or disease -high blood pressure -infection like herpes, measles, tuberculosis, or chickenpox -kidney disease -liver disease -mental problems -myasthenia gravis -osteoporosis -previous heart attack -seizures -stomach, ulcer or intestine disease including colitis and diverticulitis -thyroid problem -an unusual or allergic reaction to dexamethasone, corticosteroids, other medicines, lactose, foods, dyes, or preservatives -pregnant or trying to get pregnant -breast-feeding How should I use this medicine? Take this medicine by mouth with a drink of water. Follow the directions on  the prescription label. Take it with food or milk to avoid stomach upset. If you are taking this medicine once a day, take it in the morning. Do not take more medicine than you are told to take. Do not suddenly stop taking your medicine because you may develop a severe reaction. Your doctor will tell you how much medicine to take. If your doctor wants you to stop the medicine, the dose may be slowly lowered over time to avoid any side effects. Talk to your pediatrician regarding the use of this medicine in children. Special care may be needed. Patients over 33 years old may have a stronger reaction and need a smaller dose. Overdosage: If you think you have taken too much of this medicine contact a poison control center or emergency room at once. NOTE: This medicine is only for you. Do not share this medicine with others. What if I miss a dose? If you miss a dose, take it as soon as you can. If it is almost time for your next dose, talk to your doctor or health care professional. You may need to miss a dose or take an extra dose. Do not take double or extra doses without advice. What may interact with this medicine? Do not take this medicine with any of the following medications: -mifepristone, RU-486 -vaccines This medicine may also interact with the following  medications: -amphotericin B -antibiotics like clarithromycin, erythromycin, and troleandomycin -aspirin and aspirin-like drugs -barbiturates like phenobarbital -carbamazepine -cholestyramine -cholinesterase inhibitors like donepezil, galantamine, rivastigmine, and tacrine -cyclosporine -digoxin -diuretics -ephedrine -male hormones, like estrogens or progestins and birth control pills -indinavir -isoniazid -ketoconazole -medicines for diabetes -medicines that improve muscle tone or strength for conditions like myasthenia gravis -NSAIDs, medicines for pain and inflammation, like ibuprofen or naproxen -phenytoin -rifampin -thalidomide -warfarin This list may not describe all possible interactions. Give your health care provider a list of all the medicines, herbs, non-prescription drugs, or dietary supplements you use. Also tell them if you smoke, drink alcohol, or use illegal drugs. Some items may interact with your medicine. What should I watch for while using this medicine? Visit your doctor or health care professional for regular checks on your progress. If you are taking this medicine over a prolonged period, carry an identification card with your name and address, the type and dose of your medicine, and your doctor's name and address. This medicine may increase your risk of getting an infection. Stay away from people who are sick. Tell your doctor or health care professional if you are around anyone with measles or chickenpox. If you are going to have surgery, tell your doctor or health care professional that you have taken this medicine within the last twelve months. Ask your doctor or health care professional about your diet. You may need to lower the amount of salt you eat. The medicine can increase your blood sugar. If you are a diabetic check with your doctor if you need help adjusting the dose of your diabetic medicine. What side effects may I notice from receiving this  medicine? Side effects that you should report to your doctor or health care professional as soon as possible: -allergic reactions like skin rash, itching or hives, swelling of the face, lips, or tongue -changes in vision -fever, sore throat, sneezing, cough, or other signs of infection, wounds that will not heal -increased thirst -mental depression, mood swings, mistaken feelings of self importance or of being mistreated -pain in hips, back, ribs, arms, shoulders, or legs -  redness, blistering, peeling or loosening of the skin, including inside the mouth -trouble passing urine or change in the amount of urine -swelling of feet or lower legs -unusual bleeding or bruising Side effects that usually do not require medical attention (report to your doctor or health care professional if they continue or are bothersome): -headache -nausea, vomiting -skin problems, acne, thin and shiny skin -weight gain This list may not describe all possible side effects. Call your doctor for medical advice about side effects. You may report side effects to FDA at 1-800-FDA-1088. Where should I keep my medicine? Keep out of the reach of children. Store at room temperature between 20 and 25 degrees C (68 and 77 degrees F). Protect from light. Throw away any unused medicine after the expiration date. NOTE: This sheet is a summary. It may not cover all possible information. If you have questions about this medicine, talk to your doctor, pharmacist, or health care provider.    2016, [employer] Standard. (5732-20-25 42:70:62) Folic Acid, Vitamin B9 tablets What is this medicine? FOLIC ACID (FOE lik AS id) is a water-soluble, B complex vitamin. It is in many foods like liver, kidneys, yeast, and leafy, green vegetables. It is used to treat megaloblastic anemia and anemia from poor diet in pregnant women, babies, and children. This medicine may be used for other purposes; ask your health care provider or pharmacist  if you have questions. What should I tell my health care provider before I take this medicine? They need to know if you have any of these conditions: -alcoholism or alcohol cirrhosis -pernicious anemia -vitamin B12 deficient anemia -an unusual or allergic reaction to folic acid, other B vitamins, other medicines, foods, dyes, or preservatives -pregnant or trying to get pregnant -breast-feeding How should I use this medicine? Take this medicine by mouth with a glass of water. Follow the directions on your prescription label. Take your doses at regular intervals. Do not stop taking your medicine unless your doctor tells you to. Talk to your pediatrician regarding the use of this medicine in children. While this drug may be prescribed for selected conditions, precautions do apply. Overdosage: If you think you have taken too much of this medicine contact a poison control center or emergency room at once. NOTE: This medicine is only for you. Do not share this medicine with others. What if I miss a dose? If you miss a dose, take it as soon as you can. If it is almost time for your next dose, take only that dose. Do not take double or extra doses. What may interact with this medicine? -chloramphenicol -cholestyramine -medicines for seizures -methotrexate -nitrofurantoin -pyrimethamine This list may not describe all possible interactions. Give your health care provider a list of all the medicines, herbs, non-prescription drugs, or dietary supplements you use. Also tell them if you smoke, drink alcohol, or use illegal drugs. Some items may interact with your medicine. What should I watch for while using this medicine? Visit your doctor or health care professional for regular check ups. Your doctor may order blood tests. You need to eat a proper diet even while you are taking this vitamin. Taking vitamin supplements is not a substitute for a healthy diet. Ask your doctor or health care provider for  good nutrition advice. What side effects may I notice from receiving this medicine? Side effects that you should report to your doctor or health care professional as soon as possible: -allergic reactions such as skin rash or itching, hives, swelling  of the lips, mouth, tongue, or throat -chest tightness or pain -wheezing or shortness of breath Side effects that usually do not require medical attention (report to your doctor or health care professional if they continue or are bothersome): -bitter or bad taste -confusion -irritable -loss of appetite -nausea -stomach gas This list may not describe all possible side effects. Call your doctor for medical advice about side effects. You may report side effects to FDA at 1-800-FDA-1088. Where should I keep my medicine? Keep out of the reach of children. Store at room temperature between 15 and 30 degrees C (59 and 86 degrees F). Protect from light. This medicine is quickly broken down and made inactive when exposed to heat or light. Throw away any unused medicine after the expiration date. NOTE: This sheet is a summary. It may not cover all possible information. If you have questions about this medicine, talk to your doctor, pharmacist, or health care provider.    2016, Elsevier/Gold Standard. (2007-06-14 17:03:56) Cyanocobalamin, Vitamin B12 injection What is this medicine? CYANOCOBALAMIN (sye an oh koe BAL a min) is a man made form of vitamin B12. Vitamin B12 is used in the growth of healthy blood cells, nerve cells, and proteins in the body. It also helps with the metabolism of fats and carbohydrates. This medicine is used to treat people who can not absorb vitamin B12. This medicine may be used for other purposes; ask your health care provider or pharmacist if you have questions. What should I tell my health care provider before I take this medicine? They need to know if you have any of these conditions: -kidney disease -Leber's  disease -megaloblastic anemia -an unusual or allergic reaction to cyanocobalamin, cobalt, other medicines, foods, dyes, or preservatives -pregnant or trying to get pregnant -breast-feeding How should I use this medicine? This medicine is injected into a muscle or deeply under the skin. It is usually given by a health care professional in a clinic or doctor's office. However, your doctor may teach you how to inject yourself. Follow all instructions. Talk to your pediatrician regarding the use of this medicine in children. Special care may be needed. Overdosage: If you think you have taken too much of this medicine contact a poison control center or emergency room at once. NOTE: This medicine is only for you. Do not share this medicine with others. What if I miss a dose? If you are given your dose at a clinic or doctor's office, call to reschedule your appointment. If you give your own injections and you miss a dose, take it as soon as you can. If it is almost time for your next dose, take only that dose. Do not take double or extra doses. What may interact with this medicine? -colchicine -heavy alcohol intake This list may not describe all possible interactions. Give your health care provider a list of all the medicines, herbs, non-prescription drugs, or dietary supplements you use. Also tell them if you smoke, drink alcohol, or use illegal drugs. Some items may interact with your medicine. What should I watch for while using this medicine? Visit your doctor or health care professional regularly. You may need blood work done while you are taking this medicine. You may need to follow a special diet. Talk to your doctor. Limit your alcohol intake and avoid smoking to get the best benefit. What side effects may I notice from receiving this medicine? Side effects that you should report to your doctor or health care professional as soon  as possible: -allergic reactions like skin rash, itching or  hives, swelling of the face, lips, or tongue -blue tint to skin -chest tightness, pain -difficulty breathing, wheezing -dizziness -red, swollen painful area on the leg Side effects that usually do not require medical attention (report to your doctor or health care professional if they continue or are bothersome): -diarrhea -headache This list may not describe all possible side effects. Call your doctor for medical advice about side effects. You may report side effects to FDA at 1-800-FDA-1088. Where should I keep my medicine? Keep out of the reach of children. Store at room temperature between 15 and 30 degrees C (59 and 85 degrees F). Protect from light. Throw away any unused medicine after the expiration date. NOTE: This sheet is a summary. It may not cover all possible information. If you have questions about this medicine, talk to your doctor, pharmacist, or health care provider.    2016, Elsevier/Gold Standard. (2007-05-22 22:10:20) Chemotherapy Chemotherapy is the use of medicines to stop or slow the growth of cancer cells. Depending on the type and stage of your cancer, you may have chemotherapy to:  Cure your cancer.  Slow the progression of your cancer.  Ease your cancer symptoms.  Improve the benefits of radiation treatment.  Shrink a tumor before surgery.  Rid the body of cancer cells that remain after a tumor is surgically removed. HOW IS CHEMOTHERAPY GIVEN? Chemotherapy may be given:  By mouth in liquid or pill form.  Through a thin tube that is inserted into a vein or artery.  By getting a shot.  By rubbing a cream or ointment on your skin.  Through liquids that are placed directly into various areas of the body, such as the abdomen, chest, or bladder. HOW OFTEN IS CHEMOTHERAPY GIVEN? Chemotherapy may be given continuously over time, or it may be given in cycles. For example, you may take the medicine for one week out of every month. FOR HOW LONG WILL I NEED  CHEMOTHERAPY TREATMENTS? The length of treatment depends on many factors, including:  The type of cancer.  Whether the cancer has spread.  How you respond to the chemotherapy.  Whether you develop side effects. Some types of chemotherapy medicine are given only one time. Others are given for months, years, or for life. WHAT SAFETY PRECAUTIONS MUST I TAKE WHILE ON CHEMOTHERAPY? Chemotherapy medicines are very strong. They will be in all of your bodily fluids, including your urine, stool, saliva, sweat, tears, vaginal secretions, and semen. You must carefully follow some safety precautions to prevent harm to others while you are using these medicines. Here are some recommended precautions:  Make sure that people who help care for you wear disposable gloves if they are going to come into contact with any of your bodily fluids. Women who are pregnant or breastfeeding should not handle any of your bodily fluids.  Wash any clothes, towels, and linens that may have your bodily fluids on them twice in a washing machine using very hot water.  Dispose of adult diapers, tampons, and sanitary napkins by first sealing them in a plastic bag.  Use a condom when having sex for at least 2 weeks after receiving your chemotherapy.  Do not share beverages or food.  Keep your chemotherapy medicines in their original bottles. Keep them in a high, safe location, away from children. Do not expose them to heat or moisture. Do not put them in containers with other types of medicines.  Dispose of  all wrappers for your chemotherapy medicines by sealing them in a separate plastic bag.  Do not throw away extra medicine, and do not flush it down the toilet. Take medicine that you are not going to use to your health care provider's office where it can be disposed of properly.  Follow your health care provider's directions for the proper disposal of needles, IV tubing, and other medical supplies that have come into  contact with your chemotherapy medicines.  If you are issued a hazardous waste container, make sure you understand the directions for using it.  Wash your hands thoroughly with warm water and soap after using the bathroom. Dry your hands with disposable paper towels.  When using the toilet:  Flush it twice after each use, including after vomiting.  Close the lid of the toilet prior to flushing. This helps to avoid splashing.  Both men and women should sit to use the toilet. This helps avoid splashing. WHAT ARE THE SIDE EFFECTS OF CHEMOTHERAPY? Side effects depend on a variety of factors, including:  The specific type of chemotherapy medicine used.  The dosage.  How long the medicine is used for.  Your overall health. Some of the side effects you may experience include:  Fatigue and decreased energy.  Decreased appetite.  Changes in your sense of smell or taste.  Nausea.  Vomiting.  Constipation or diarrhea.  Hair loss.  Increased susceptibility to infection.  Easy bleeding.  Mouth sores.  Burning or tingling in the hands or feet.  Memory problems.   This information is not intended to replace advice given to you by your health care provider. Make sure you discuss any questions you have with your health care provider.   Document Released: 12/06/2006 Document Revised: 03/01/2014 Document Reviewed: 07/17/2013 Elsevier Interactive Patient Education [employer].

## 2014-12-03 ENCOUNTER — Other Ambulatory Visit (HOSPITAL_COMMUNITY): Payer: Self-pay | Admitting: Oncology

## 2014-12-03 ENCOUNTER — Inpatient Hospital Stay (HOSPITAL_COMMUNITY): Payer: Commercial Managed Care - HMO

## 2014-12-03 LAB — BASIC METABOLIC PANEL
Anion gap: 6 (ref 5–15)
BUN: 10 mg/dL (ref 6–20)
CALCIUM: 7.6 mg/dL — AB (ref 8.9–10.3)
CO2: 24 mmol/L (ref 22–32)
CREATININE: 0.69 mg/dL (ref 0.61–1.24)
Chloride: 106 mmol/L (ref 101–111)
GFR calc Af Amer: >60 mL/min (ref >60)
Glucose, Bld: 92 mg/dL (ref 65–99)
Potassium: 3.7 mmol/L (ref 3.5–5.1)
SODIUM: 136 mmol/L (ref 135–145)

## 2014-12-03 MED ORDER — CYANOCOBALAMIN 1000 MCG/ML IJ SOLN
1000.0000 ug | Freq: Once | INTRAMUSCULAR | Status: AC
  Administered 2014-12-03: 1000 ug via INTRAMUSCULAR
  Filled 2014-12-03: qty 1

## 2014-12-03 NOTE — Progress Notes (Signed)
Pemetrexed chemo teaching done with family. Brother Product manager signed consent for Pemetrexed. Chemo and Dex calendar given to patient's brother Jared Gill. Folic acid tablet called into [facility] also. Brother aware that he has 2 drugs to pick up at [facility]. Distress screening done. Patient to get Vitamin B 12 injection today. Family made aware of reason for folic acid and Vitamin b12 injection(s).

## 2014-12-03 NOTE — Progress Notes (Signed)
    Subjective: States several loose stools yesterday. No abdominal pain. No rectal bleeding. Tolerating diet.   Objective: Vital signs in last 24 hours: Temp:  [97.7 F (36.5 C)-98.5 F (36.9 C)] 98.2 F (36.8 C) (10/11 0426) Pulse Rate:  [88-101] 88 (10/11 0426) Resp:  [18-20] 18 (10/11 0426) BP: (106-122)/(64-72) 110/64 mmHg (10/11 0426) SpO2:  [97 %-100 %] 98 % (10/11 0426) Weight:  [235 lb 7.2 oz (106.8 kg)] 235 lb 7.2 oz (106.8 kg) (10/11 0427) Last BM Date: 12/02/14 General:   Alert and oriented, pleasant Head:  Normocephalic and atraumatic. Abdomen:  Bowel sounds present, soft, non-tender, non-distended. No HSM or hernias noted. No rebound or guarding. No masses appreciated  Msk:  Symmetrical without gross deformities. Normal posture. Psych:  Alert and cooperative. Normal mood and affect.  Intake/Output from previous day: 10/10 0701 - 10/11 0700 In: 1929.7 [P.O.:640; I.V.:689.7; IV Piggyback:600] Out: 2325 [Urine:2325] Intake/Output this shift:    Lab Results:  Recent Labs  11/30/14 1319 12/02/14 0516  WBC 10.3 9.5  HGB 14.5 12.9*  HCT 41.7 37.9*  PLT 64* 77*   BMET  Recent Labs  12/01/14 0555 12/02/14 0516 12/03/14 0605  NA 135 136 136  K 4.1 4.1 3.7  CL 106 106 106  CO2 '23 25 24  '$ GLUCOSE 115* 94 92  BUN '9 8 10  '$ CREATININE 0.65 0.68 0.69  CALCIUM 7.4* 7.6* 7.6*   LFT  Recent Labs  11/30/14 1319  PROT 5.0*  ALBUMIN 2.7*  AST 31  ALT 39  ALKPHOS 41  BILITOT 0.8    Assessment: 74 year old male admitted with syncopal episode after BM, questionable findings of proctitis on CT but likely these findings are secondary to refractory constipation/obstipation that was recently relieved by laxative use prior to admission. Consider stopping Cipro and Flagyl: will leave at the discretion of the attending provider. GI pathogen panel is pending. Clinically, patient has improved from a GI standpoint since admission.   Plan: As outpatient, resume  [place] every other day Follow-up on GI pathogen panel Will follow peripherally Consider stopping Cipro/Flagyl: will leave at discretion of attending provider   Orvil Feil, ANP-BC [facility]     LOS: 3 days    12/03/2014, 7:53 AM

## 2014-12-03 NOTE — Progress Notes (Signed)
Subjective: [name] denies any further diarrhea. He denies abdominal pain. He is not having fever. Appreciate GI consult.  Objective: Vital signs in last 24 hours: Filed Vitals:   12/02/14 2121 12/03/14 0000 12/03/14 0426 12/03/14 0427  BP: 106/68 122/72 110/64   Pulse: 101 94 88   Temp: 97.7 F (36.5 C) 98.5 F (36.9 C) 98.2 F (36.8 C)   TempSrc: Oral Oral Oral   Resp: '18 18 18   '$ Height:      Weight:    235 lb 7.2 oz (106.8 kg)  SpO2: 97% 98% 98%    Weight change: 5 lb 8.2 oz (2.5 kg)  Intake/Output Summary (Last 24 hours) at 12/03/14 0737 Last data filed at 12/03/14 0425  Gross per 24 hour  Intake 1929.66 ml  Output   2325 ml  Net -395.34 ml    Physical Exam: Alert. No distress. Pharynx moist. Lungs clear. Heart regular with no murmurs. Abdomen soft and nontender with no palpable organomegaly or mass. Extremities reveal no edema. Vision is limited.  Lab Results:    Results for orders placed or performed during the hospital encounter of 11/30/14 (from the past 24 hour(s))  Basic metabolic panel     Status: Abnormal   Collection Time: 12/03/14  6:05 AM  Result Value Ref Range   Sodium 136 135 - 145 mmol/L   Potassium 3.7 3.5 - 5.1 mmol/L   Chloride 106 101 - 111 mmol/L   CO2 24 22 - 32 mmol/L   Glucose, Bld 92 65 - 99 mg/dL   BUN 10 6 - 20 mg/dL   Creatinine, Ser 0.69 0.61 - 1.24 mg/dL   Calcium 7.6 (L) 8.9 - 10.3 mg/dL   GFR calc non Af Amer >60 >60 mL/min   GFR calc Af Amer >60 >60 mL/min   Anion gap 6 5 - 15     ABGS No results for input(s): PHART, PO2ART, TCO2, HCO3 in the last 72 hours.  Invalid input(s): PCO2 CULTURES Recent Results (from the past 240 hour(s))  Blood culture (routine x 2)     Status: None (Preliminary result)   Collection Time: 11/30/14  2:34 PM  Result Value Ref Range Status   Specimen Description BLOOD LEFT ANTECUBITAL  Final   Special Requests BOTTLES DRAWN AEROBIC AND ANAEROBIC 4CC  Final   Culture NO GROWTH 2 DAYS  Final   Report Status PENDING  Incomplete  Culture, blood (routine x 2)     Status: None (Preliminary result)   Collection Time: 11/30/14  2:37 PM  Result Value Ref Range Status   Specimen Description BLOOD BLOOD LEFT HAND  Final   Special Requests BOTTLES DRAWN AEROBIC AND ANAEROBIC 4CC  Final   Culture NO GROWTH 2 DAYS  Final   Report Status PENDING  Incomplete  Culture, Urine     Status: None (Preliminary result)   Collection Time: 11/30/14  3:51 PM  Result Value Ref Range Status   Specimen Description URINE, CLEAN CATCH  Final   Special Requests NONE  Final   Culture   Final    CULTURE REINCUBATED FOR BETTER GROWTH Performed at [facility]    Report Status PENDING  Incomplete   Studies/Results: No results found. Micro Results: Recent Results (from the past 240 hour(s))  Blood culture (routine x 2)     Status: None (Preliminary result)   Collection Time: 11/30/14  2:34 PM  Result Value Ref Range Status   Specimen Description BLOOD LEFT ANTECUBITAL  Final  Special Requests BOTTLES DRAWN AEROBIC AND ANAEROBIC 4CC  Final   Culture NO GROWTH 2 DAYS  Final   Report Status PENDING  Incomplete  Culture, blood (routine x 2)     Status: None (Preliminary result)   Collection Time: 11/30/14  2:37 PM  Result Value Ref Range Status   Specimen Description BLOOD BLOOD LEFT HAND  Final   Special Requests BOTTLES DRAWN AEROBIC AND ANAEROBIC 4CC  Final   Culture NO GROWTH 2 DAYS  Final   Report Status PENDING  Incomplete  Culture, Urine     Status: None (Preliminary result)   Collection Time: 11/30/14  3:51 PM  Result Value Ref Range Status   Specimen Description URINE, CLEAN CATCH  Final   Special Requests NONE  Final   Culture   Final    CULTURE REINCUBATED FOR BETTER GROWTH Performed at [facility]    Report Status PENDING  Incomplete   Studies/Results: No results found. Medications:  I have reviewed the patient's current medications Scheduled Meds: . ciprofloxacin   400 mg Intravenous Q12H  . dexamethasone  4 mg Oral Daily  . enoxaparin (LOVENOX) injection  40 mg Subcutaneous Q24H  . metoprolol tartrate  25 mg Oral BID  . metroNIDAZOLE  500 mg Intravenous 3 times per day  . pantoprazole  80 mg Oral Daily  . sodium chloride  3 mL Intravenous Q12H   Continuous Infusions: . sodium chloride 50 mL/hr at 12/02/14 2119   PRN Meds:.alum & mag hydroxide-simeth, LORazepam, ondansetron **OR** ondansetron (ZOFRAN) IV   Assessment/Plan: #1. Colitis. Stool studies are pending. Symptoms are improved. Diet was advanced by GI. He seems to be tolerating this well. Question need for Cipro and Flagyl. #2. Metastatic lung cancer. #3. Possible UTI. Blood and urine cultures are pending. #4. Elevated lactic acid level. #5. Syncope. No recurrence. Up in chair today. Active Problems:   Syncope   Infectious colitis   UTI (urinary tract infection)   Proctitis     LOS: 3 days   Hendy Brindle 12/03/2014, 7:37 AM

## 2014-12-03 NOTE — Care Management Note (Signed)
Case Management Note  Patient Details  Name: Jared Gill MRN: [id number] Date of Birth: 07-20-1940  Expected Discharge Date:     12/04/2014             Expected Discharge Plan:  [place]  In-House Referral:  NA  Discharge planning Services  CM Consult  Post Acute Care Choice:  Home Health Choice offered to:  Patient, Sibling  DME Arranged:    DME Agency:     HH Arranged:  RN [place] Agency:     Status of Service:  In process, will continue to follow  Medicare Important Message Given:    Date Medicare IM Given:    Medicare IM give by:    Date Additional Medicare IM Given:    Additional Medicare Important Message give by:     If discussed at [place] of Stay Meetings, dates discussed:    Additional Comments: Pt admitted with syncopy. Pt is from home, lives with brother and is ind at baseline. Pt uses no DME's or recieves any HH services prior to admission. Pt's brother present for assessment. Pt's brother requests [facility] services as his brother has been ill and his brother is concerned about him. Pt's brother given a list of [place] agencies, will review and make agency choice in AM. Pt has gotten up to chair and does not feel he will need any DME at [place]. Will cont to follow.   Sherald Barge, RN 12/03/2014, 4:18 PM

## 2014-12-03 NOTE — Patient Instructions (Signed)
[place]   CHEMOTHERAPY INSTRUCTIONS   Premeds: Zofran - through the IV - for nausea/vomiting prevention/reduction. Dexamethasone - steroid - through the IV - given to reduce the risk of you having a skin rash from the Pemetrexed chemo. Dex can cause you to feel energized, nervous/anxious/jittery, make you have trouble sleeping, and/or make you feel hot/flushed in the face/neck and/or look pink/red in the face/neck. These side effects will pass as the Dex wears off. (takes 30 minutes to infuse)  Pemetrexed - Side Effects - bone marrow suppression (lowers white blood cells (fight infection), lowers red blood cells (make up your blood), lowers platelets (help blood to clot). Fatigue, nausea/vomiting, chest pain, shortness of breath. While taking Pemetrexed, we will have you take Folic acid at home. You will need to take Folic acid every day and for 21 days after the completion of Pemetrexed. We will also give you Vitamin B12 injections periodically during your treatments. You will also be taking a steroid (Dexamethasone) while taking your Pemetrexed treatments. You will take this the day before, day of, and day after Pemetrexed. This will decrease the incidence of skin rash. Take it whether you think you need it or not.    Pemetrexed takes 10 minutes to infuse.       (We also draw blood before each chemo treatment to make sure your liver, kidneys, white blood cells, red blood cells, and platelets are looking normal).   POTENTIAL SIDE EFFECTS OF TREATMENT: Increased Susceptibility to Infection, Vomiting, Constipation, Hair Thinning, Changes in Character of Skin and Nails (brittleness, dryness,etc.), Bone Marrow Suppression, Nausea and Diarrhea   EDUCATIONAL MATERIALS GIVEN AND REVIEWED: Chemotherapy and You booklet specific Instructions Sheets: Pemetrexed   SELF CARE ACTIVITIES WHILE ON CHEMOTHERAPY: Increase your fluid intake 48 hours prior to treatment and  drink at least 2 quarts per day after treatment., No alcohol intake., No aspirin or other medications unless approved by your oncologist., Eat foods that are light and easy to digest., Eat foods at cold or room temperature., No fried, fatty, or spicy foods immediately before or after treatment., Have teeth cleaned professionally before starting treatment. Keep dentures and partial plates clean., Use soft toothbrush and do not use mouthwashes that contain alcohol. Biotene is a good mouthwash that is available at most pharmacies or may be ordered by calling [phone]., Use warm salt water gargles (1 teaspoon salt per 1 quart warm water) before and after meals and at bedtime. Or you may rinse with 2 tablespoons of three -percent hydrogen peroxide mixed in eight ounces of water., Always use sunscreen with SPF (Sun Protection Factor) of 30 or higher., Use your nausea medication as directed to prevent nausea., Use your stool softener or laxative as directed to prevent constipation. and Use your anti-diarrheal medication as directed to stop diarrhea.   Please wash your hands for at least 30 seconds using warm soapy water. Handwashing is the #1 way to prevent the spread of germs. Stay away from sick people or people who are getting over a cold. If you develop respiratory systems such as green/yellow mucus production or productive cough or persistent cough let us know and we will see if you need an antibiotic. It is a good idea to keep a pair of gloves on when going into grocery stores/[employer] to decrease your risk of coming into contact with germs on the carts, etc. Carry alcohol hand gel with you at all times and use it frequently if out in  public. All foods need to be cooked thoroughly. No raw foods. No medium or undercooked meats, eggs. If your food is cooked medium well, it does not need to be hot pink or saturated with bloody liquid at all. Vegetables and fruits need to be washed/rinsed under the faucet with a  dish detergent before being consumed. You can eat raw fruits and vegetables unless we tell you otherwise but it would be best if you cooked them or bought frozen. Do not eat off of salad bars or hot bars unless you really trust the cleanliness of the restaurant. If you need dental work, please let Dr. Whitney Muse know before you go for your appointment so that we can coordinate the best possible time for you in regards to your chemo regimen. You need to also let your dentist know that you are actively taking chemo. We may need to do labs prior to your dental appointment. We also want your bowels moving at least every other day. If this is not happening, we need to know so that we can get you on a bowel regimen to help you go.    MEDICATIONS: You have been given prescriptions for the following medications:  Currently you have Zofran '4mg'$  ordered and you can take it every 6 hours as needed for nausea/vomiting. If you need to increase your Zofran to '8mg'$  you may do so but you will need to only take the '8mg'$  dosage every 8 hours as needed. (this medication can constipate)  Dexamethasone '4mg'$  tablet. The day before, day of, and day after chemo take 2 tabs in the am and 2 tabs in the pm. This medication is being given to reduce the risk of you having a skin rash from the Pemetrexed chemo.  Folic acid '1mg'$  tablet. Take 1 tablet everyday and for 21 days after the completion of Pemetrexed chemo. This medication works to reduce the risk of you having side effects such as nausea/vomiting from the Pemetrexed chemo.   Over-the-Counter Meds:  Miralax 1 capful in 8 oz of fluid daily. May increase to two times a day if needed. This is a stool softener. If this doesn't work proceed you can add:  Senokot S  - start with 1 tablet two times a day and increase to 4 tablets two times a day if needed. (total of 8 tablets in a 24 hour period). This is a stimulant laxative.   Call us if this does not help your bowels move.    Imodium '2mg'$  capsule. Take 2 capsules after the 1st loose stool and then 1 capsule every 2 hours until you go a total of 12 hours without having a loose stool. Call the [facility] if loose stools continue.    SYMPTOMS TO REPORT AS SOON AS POSSIBLE AFTER TREATMENT:  FEVER GREATER THAN 100.5 F  CHILLS WITH OR WITHOUT FEVER  NAUSEA AND VOMITING THAT IS NOT CONTROLLED WITH YOUR NAUSEA MEDICATION  UNUSUAL SHORTNESS OF BREATH  UNUSUAL BRUISING OR BLEEDING  TENDERNESS IN MOUTH AND THROAT WITH OR WITHOUT PRESENCE OF ULCERS  URINARY PROBLEMS  BOWEL PROBLEMS  UNUSUAL RASH    Wear comfortable clothing and clothing appropriate for easy access to any Portacath or PICC line. Let us know if there is anything that we can do to make your therapy better!      I have been informed and understand all of the instructions given to me and have received a copy. I have been instructed to call the clinic [phone]  or my family physician as soon as possible for continued medical care, if indicated. I do not have any more questions at this time but understand that I may call the [facility] or the Patient Navigator at [phone] during office hours should I have questions or need assistance in obtaining follow-up care.       Pemetrexed injection What is this medicine? PEMETREXED (PEM e TREX ed) is a chemotherapy drug. This medicine affects cells that are rapidly growing, such as cancer cells and cells in your mouth and stomach. It is usually used to treat lung cancers like non-small cell lung cancer and mesothelioma. It may also be used to treat other cancers. This medicine may be used for other purposes; ask your health care provider or pharmacist if you have questions. What should I tell my health care provider before I take this medicine? They need to know if you have any of these conditions: -if you frequently drink alcohol containing beverages -infection (especially a virus  infection such as chickenpox, cold sores, or herpes) -kidney disease -liver disease -low blood counts, like low platelets, red bloods, or white blood cells -an unusual or allergic reaction to pemetrexed, mannitol, other medicines, foods, dyes, or preservatives -pregnant or trying to get pregnant -breast-feeding How should I use this medicine? This drug is given as an infusion into a vein. It is administered in a hospital or clinic by a specially trained health care professional. Talk to your pediatrician regarding the use of this medicine in children. Special care may be needed. Overdosage: If you think you have taken too much of this medicine contact a poison control center or emergency room at once. NOTE: This medicine is only for you. Do not share this medicine with others. What if I miss a dose? It is important not to miss your dose. Call your doctor or health care professional if you are unable to keep an appointment. What may interact with this medicine? -aspirin and aspirin-like medicines -medicines to increase blood counts like filgrastim, pegfilgrastim, sargramostim -methotrexate -NSAIDS, medicines for pain and inflammation, like ibuprofen or naproxen -probenecid -pyrimethamine -vaccines Talk to your doctor or health care professional before taking any of these medicines: -acetaminophen -aspirin -ibuprofen -ketoprofen -naproxen This list may not describe all possible interactions. Give your health care provider a list of all the medicines, herbs, non-prescription drugs, or dietary supplements you use. Also tell them if you smoke, drink alcohol, or use illegal drugs. Some items may interact with your medicine. What should I watch for while using this medicine? Visit your doctor for checks on your progress. This drug may make you feel generally unwell. This is not uncommon, as chemotherapy can affect healthy cells as well as cancer cells. Report any side effects. Continue your  course of treatment even though you feel ill unless your doctor tells you to stop. In some cases, you may be given additional medicines to help with side effects. Follow all directions for their use. Call your doctor or health care professional for advice if you get a fever, chills or sore throat, or other symptoms of a cold or flu. Do not treat yourself. This drug decreases your body's ability to fight infections. Try to avoid being around people who are sick. This medicine may increase your risk to bruise or bleed. Call your doctor or health care professional if you notice any unusual bleeding. Be careful brushing and flossing your teeth or using a toothpick because you may get an infection or  bleed more easily. If you have any dental work done, tell your dentist you are receiving this medicine. Avoid taking products that contain aspirin, acetaminophen, ibuprofen, naproxen, or ketoprofen unless instructed by your doctor. These medicines may hide a fever. Call your doctor or health care professional if you get diarrhea or mouth sores. Do not treat yourself. To protect your kidneys, drink water or other fluids as directed while you are taking this medicine. Men and women must use effective birth control while taking this medicine. You may also need to continue using effective birth control for a time after stopping this medicine. Do not become pregnant while taking this medicine. Tell your doctor right away if you think that you or your partner might be pregnant. There is a potential for serious side effects to an unborn child. Talk to your health care professional or pharmacist for more information. Do not breast-feed an infant while taking this medicine. This medicine may lower sperm counts. What side effects may I notice from receiving this medicine? Side effects that you should report to your doctor or health care professional as soon as possible: -allergic reactions like skin rash, itching or hives,  swelling of the face, lips, or tongue -low blood counts - this medicine may decrease the number of white blood cells, red blood cells and platelets. You may be at increased risk for infections and bleeding. -signs of infection - fever or chills, cough, sore throat, pain or difficulty passing urine -signs of decreased platelets or bleeding - bruising, pinpoint red spots on the skin, black, tarry stools, blood in the urine -signs of decreased red blood cells - unusually weak or tired, fainting spells, lightheadedness -breathing problems, like a dry cough -changes in emotions or moods -chest pain -confusion -diarrhea -high blood pressure -mouth or throat sores or ulcers -pain, swelling, warmth in the leg -pain on swallowing -swelling of the ankles, feet, hands -trouble passing urine or change in the amount of urine -vomiting -yellowing of the eyes or skin Side effects that usually do not require medical attention (report to your doctor or health care professional if they continue or are bothersome): -hair loss -loss of appetite -nausea -stomach upset This list may not describe all possible side effects. Call your doctor for medical advice about side effects. You may report side effects to FDA at 1-800-FDA-1088. Where should I keep my medicine? This drug is given in a hospital or clinic and will not be stored at home. NOTE: This sheet is a summary. It may not cover all possible information. If you have questions about this medicine, talk to your doctor, pharmacist, or health care provider.    2016, Elsevier/[employer] Standard. (2007-09-12 13:24:03) Dexamethasone tablets What is this medicine? DEXAMETHASONE (dex a METH a sone) is a corticosteroid. It is commonly used to treat inflammation of the skin, joints, lungs, and other organs. Common conditions treated include asthma, allergies, and arthritis. It is also used for other conditions, such as blood disorders and diseases of the adrenal  glands. This medicine may be used for other purposes; ask your health care provider or pharmacist if you have questions. What should I tell my health care provider before I take this medicine? They need to know if you have any of these conditions: -Cushing's syndrome -diabetes -glaucoma -heart problems or disease -high blood pressure -infection like herpes, measles, tuberculosis, or chickenpox -kidney disease -liver disease -mental problems -myasthenia gravis -osteoporosis -previous heart attack -seizures -stomach, ulcer or intestine disease including colitis and diverticulitis -  thyroid problem -an unusual or allergic reaction to dexamethasone, corticosteroids, other medicines, lactose, foods, dyes, or preservatives -pregnant or trying to get pregnant -breast-feeding How should I use this medicine? Take this medicine by mouth with a drink of water. Follow the directions on the prescription label. Take it with food or milk to avoid stomach upset. If you are taking this medicine once a day, take it in the morning. Do not take more medicine than you are told to take. Do not suddenly stop taking your medicine because you may develop a severe reaction. Your doctor will tell you how much medicine to take. If your doctor wants you to stop the medicine, the dose may be slowly lowered over time to avoid any side effects. Talk to your pediatrician regarding the use of this medicine in children. Special care may be needed. Patients over 83 years old may have a stronger reaction and need a smaller dose. Overdosage: If you think you have taken too much of this medicine contact a poison control center or emergency room at once. NOTE: This medicine is only for you. Do not share this medicine with others. What if I miss a dose? If you miss a dose, take it as soon as you can. If it is almost time for your next dose, talk to your doctor or health care professional. You may need to miss a dose or take an  extra dose. Do not take double or extra doses without advice. What may interact with this medicine? Do not take this medicine with any of the following medications: -mifepristone, RU-486 -vaccines This medicine may also interact with the following medications: -amphotericin B -antibiotics like clarithromycin, erythromycin, and troleandomycin -aspirin and aspirin-like drugs -barbiturates like phenobarbital -carbamazepine -cholestyramine -cholinesterase inhibitors like donepezil, galantamine, rivastigmine, and tacrine -cyclosporine -digoxin -diuretics -ephedrine -male hormones, like estrogens or progestins and birth control pills -indinavir -isoniazid -ketoconazole -medicines for diabetes -medicines that improve muscle tone or strength for conditions like myasthenia gravis -NSAIDs, medicines for pain and inflammation, like ibuprofen or naproxen -phenytoin -rifampin -thalidomide -warfarin This list may not describe all possible interactions. Give your health care provider a list of all the medicines, herbs, non-prescription drugs, or dietary supplements you use. Also tell them if you smoke, drink alcohol, or use illegal drugs. Some items may interact with your medicine. What should I watch for while using this medicine? Visit your doctor or health care professional for regular checks on your progress. If you are taking this medicine over a prolonged period, carry an identification card with your name and address, the type and dose of your medicine, and your doctor's name and address. This medicine may increase your risk of getting an infection. Stay away from people who are sick. Tell your doctor or health care professional if you are around anyone with measles or chickenpox. If you are going to have surgery, tell your doctor or health care professional that you have taken this medicine within the last twelve months. Ask your doctor or health care professional about your diet. You may  need to lower the amount of salt you eat. The medicine can increase your blood sugar. If you are a diabetic check with your doctor if you need help adjusting the dose of your diabetic medicine. What side effects may I notice from receiving this medicine? Side effects that you should report to your doctor or health care professional as soon as possible: -allergic reactions like skin rash, itching or hives, swelling of the face, lips,  or tongue -changes in vision -fever, sore throat, sneezing, cough, or other signs of infection, wounds that will not heal -increased thirst -mental depression, mood swings, mistaken feelings of self importance or of being mistreated -pain in hips, back, ribs, arms, shoulders, or legs -redness, blistering, peeling or loosening of the skin, including inside the mouth -trouble passing urine or change in the amount of urine -swelling of feet or lower legs -unusual bleeding or bruising Side effects that usually do not require medical attention (report to your doctor or health care professional if they continue or are bothersome): -headache -nausea, vomiting -skin problems, acne, thin and shiny skin -weight gain This list may not describe all possible side effects. Call your doctor for medical advice about side effects. You may report side effects to FDA at 1-800-FDA-1088. Where should I keep my medicine? Keep out of the reach of children. Store at room temperature between 20 and 25 degrees C (68 and 77 degrees F). Protect from light. Throw away any unused medicine after the expiration date. NOTE: This sheet is a summary. It may not cover all possible information. If you have questions about this medicine, talk to your doctor, pharmacist, or health care provider.    2016, Elsevier/Gold Standard. ([id number] 84:13:24) Folic Acid, Vitamin B9 tablets What is this medicine? FOLIC ACID (FOE lik AS id) is a water-soluble, B complex vitamin. It is in many foods like  liver, kidneys, yeast, and leafy, green vegetables. It is used to treat megaloblastic anemia and anemia from poor diet in pregnant women, babies, and children. This medicine may be used for other purposes; ask your health care provider or pharmacist if you have questions. What should I tell my health care provider before I take this medicine? They need to know if you have any of these conditions: -alcoholism or alcohol cirrhosis -pernicious anemia -vitamin B12 deficient anemia -an unusual or allergic reaction to folic acid, other B vitamins, other medicines, foods, dyes, or preservatives -pregnant or trying to get pregnant -breast-feeding How should I use this medicine? Take this medicine by mouth with a glass of water. Follow the directions on your prescription label. Take your doses at regular intervals. Do not stop taking your medicine unless your doctor tells you to. Talk to your pediatrician regarding the use of this medicine in children. While this drug may be prescribed for selected conditions, precautions do apply. Overdosage: If you think you have taken too much of this medicine contact a poison control center or emergency room at once. NOTE: This medicine is only for you. Do not share this medicine with others. What if I miss a dose? If you miss a dose, take it as soon as you can. If it is almost time for your next dose, take only that dose. Do not take double or extra doses. What may interact with this medicine? -chloramphenicol -cholestyramine -medicines for seizures -methotrexate -nitrofurantoin -pyrimethamine This list may not describe all possible interactions. Give your health care provider a list of all the medicines, herbs, non-prescription drugs, or dietary supplements you use. Also tell them if you smoke, drink alcohol, or use illegal drugs. Some items may interact with your medicine. What should I watch for while using this medicine? Visit your doctor or health care  professional for regular check ups. Your doctor may order blood tests. You need to eat a proper diet even while you are taking this vitamin. Taking vitamin supplements is not a substitute for a healthy diet. Ask your doctor or  health care provider for good nutrition advice. What side effects may I notice from receiving this medicine? Side effects that you should report to your doctor or health care professional as soon as possible: -allergic reactions such as skin rash or itching, hives, swelling of the lips, mouth, tongue, or throat -chest tightness or pain -wheezing or shortness of breath Side effects that usually do not require medical attention (report to your doctor or health care professional if they continue or are bothersome): -bitter or bad taste -confusion -irritable -loss of appetite -nausea -stomach gas This list may not describe all possible side effects. Call your doctor for medical advice about side effects. You may report side effects to FDA at 1-800-FDA-1088. Where should I keep my medicine? Keep out of the reach of children. Store at room temperature between 15 and 30 degrees C (59 and 86 degrees F). Protect from light. This medicine is quickly broken down and made inactive when exposed to heat or light. Throw away any unused medicine after the expiration date. NOTE: This sheet is a summary. It may not cover all possible information. If you have questions about this medicine, talk to your doctor, pharmacist, or health care provider.    2016, [employer] Standard. (2007-06-14 17:03:56) Cyanocobalamin, Vitamin B12 injection What is this medicine? CYANOCOBALAMIN (sye an oh koe BAL a min) is a man made form of vitamin B12. Vitamin B12 is used in the growth of healthy blood cells, nerve cells, and proteins in the body. It also helps with the metabolism of fats and carbohydrates. This medicine is used to treat people who can not absorb vitamin B12. This medicine may be used for  other purposes; ask your health care provider or pharmacist if you have questions. What should I tell my health care provider before I take this medicine? They need to know if you have any of these conditions: -kidney disease -Leber's disease -megaloblastic anemia -an unusual or allergic reaction to cyanocobalamin, cobalt, other medicines, foods, dyes, or preservatives -pregnant or trying to get pregnant -breast-feeding How should I use this medicine? This medicine is injected into a muscle or deeply under the skin. It is usually given by a health care professional in a clinic or doctor's office. However, your doctor may teach you how to inject yourself. Follow all instructions. Talk to your pediatrician regarding the use of this medicine in children. Special care may be needed. Overdosage: If you think you have taken too much of this medicine contact a poison control center or emergency room at once. NOTE: This medicine is only for you. Do not share this medicine with others. What if I miss a dose? If you are given your dose at a clinic or doctor's office, call to reschedule your appointment. If you give your own injections and you miss a dose, take it as soon as you can. If it is almost time for your next dose, take only that dose. Do not take double or extra doses. What may interact with this medicine? -colchicine -heavy alcohol intake This list may not describe all possible interactions. Give your health care provider a list of all the medicines, herbs, non-prescription drugs, or dietary supplements you use. Also tell them if you smoke, drink alcohol, or use illegal drugs. Some items may interact with your medicine. What should I watch for while using this medicine? Visit your doctor or health care professional regularly. You may need blood work done while you are taking this medicine. You may need to follow  a special diet. Talk to your doctor. Limit your alcohol intake and avoid smoking to  get the best benefit. What side effects may I notice from receiving this medicine? Side effects that you should report to your doctor or health care professional as soon as possible: -allergic reactions like skin rash, itching or hives, swelling of the face, lips, or tongue -blue tint to skin -chest tightness, pain -difficulty breathing, wheezing -dizziness -red, swollen painful area on the leg Side effects that usually do not require medical attention (report to your doctor or health care professional if they continue or are bothersome): -diarrhea -headache This list may not describe all possible side effects. Call your doctor for medical advice about side effects. You may report side effects to FDA at 1-800-FDA-1088. Where should I keep my medicine? Keep out of the reach of children. Store at room temperature between 15 and 30 degrees C (59 and 85 degrees F). Protect from light. Throw away any unused medicine after the expiration date. NOTE: This sheet is a summary. It may not cover all possible information. If you have questions about this medicine, talk to your doctor, pharmacist, or health care provider.    2016, Elsevier/Gold Standard. (2007-05-22 22:10:20)

## 2014-12-04 ENCOUNTER — Ambulatory Visit (HOSPITAL_COMMUNITY): Payer: Self-pay

## 2014-12-04 ENCOUNTER — Observation Stay (HOSPITAL_COMMUNITY)
Admission: EM | Admit: 2014-12-04 | Discharge: 2014-12-05 | Disposition: A | Discharge status: Home / Self Care | Payer: Commercial Managed Care - HMO | Attending: Internal Medicine | Admitting: Internal Medicine

## 2014-12-04 ENCOUNTER — Other Ambulatory Visit: Payer: Self-pay

## 2014-12-04 ENCOUNTER — Encounter (HOSPITAL_COMMUNITY): Payer: Self-pay | Admitting: Emergency Medicine

## 2014-12-04 DIAGNOSIS — D696 Thrombocytopenia, unspecified: Secondary | ICD-10-CM

## 2014-12-04 DIAGNOSIS — C7931 Secondary malignant neoplasm of brain: Secondary | ICD-10-CM | POA: Diagnosis present

## 2014-12-04 DIAGNOSIS — K5792 Diverticulitis of intestine, part unspecified, without perforation or abscess without bleeding: Secondary | ICD-10-CM | POA: Insufficient documentation

## 2014-12-04 DIAGNOSIS — C3490 Malignant neoplasm of unspecified part of unspecified bronchus or lung: Secondary | ICD-10-CM | POA: Diagnosis not present

## 2014-12-04 DIAGNOSIS — R55 Syncope and collapse: Secondary | ICD-10-CM | POA: Diagnosis not present

## 2014-12-04 DIAGNOSIS — Z79899 Other long term (current) drug therapy: Secondary | ICD-10-CM | POA: Insufficient documentation

## 2014-12-04 DIAGNOSIS — H538 Other visual disturbances: Secondary | ICD-10-CM | POA: Insufficient documentation

## 2014-12-04 DIAGNOSIS — I10 Essential (primary) hypertension: Secondary | ICD-10-CM | POA: Insufficient documentation

## 2014-12-04 DIAGNOSIS — R531 Weakness: Secondary | ICD-10-CM | POA: Insufficient documentation

## 2014-12-04 DIAGNOSIS — R5383 Other fatigue: Secondary | ICD-10-CM | POA: Insufficient documentation

## 2014-12-04 DIAGNOSIS — Z85118 Personal history of other malignant neoplasm of bronchus and lung: Secondary | ICD-10-CM | POA: Insufficient documentation

## 2014-12-04 DIAGNOSIS — Z85841 Personal history of malignant neoplasm of brain: Secondary | ICD-10-CM | POA: Insufficient documentation

## 2014-12-04 DIAGNOSIS — Z86010 Personal history of colonic polyps: Secondary | ICD-10-CM | POA: Insufficient documentation

## 2014-12-04 DIAGNOSIS — Z87891 Personal history of nicotine dependence: Secondary | ICD-10-CM | POA: Insufficient documentation

## 2014-12-04 DIAGNOSIS — N400 Enlarged prostate without lower urinary tract symptoms: Secondary | ICD-10-CM | POA: Insufficient documentation

## 2014-12-04 DIAGNOSIS — K219 Gastro-esophageal reflux disease without esophagitis: Secondary | ICD-10-CM | POA: Insufficient documentation

## 2014-12-04 LAB — URINE CULTURE

## 2014-12-04 LAB — CBC WITH DIFFERENTIAL/PLATELET
BASOS ABS: 0 10*3/uL (ref 0.0–0.1)
BASOS PCT: 0 %
Eosinophils Absolute: 0 10*3/uL (ref 0.0–0.7)
Eosinophils Relative: 0 %
HEMATOCRIT: 39.5 % (ref 39.0–52.0)
HEMOGLOBIN: 13.3 g/dL (ref 13.0–17.0)
Lymphocytes Relative: 7 %
Lymphs Abs: 0.6 10*3/uL — ABNORMAL LOW (ref 0.7–4.0)
MCH: 24.2 pg — ABNORMAL LOW (ref 26.0–34.0)
MCHC: 33.7 g/dL (ref 30.0–36.0)
MCV: 71.9 fL — ABNORMAL LOW (ref 78.0–100.0)
Monocytes Absolute: 0.9 10*3/uL (ref 0.1–1.0)
Monocytes Relative: 10 %
NEUTROS ABS: 7.1 10*3/uL (ref 1.7–7.7)
NEUTROS PCT: 83 %
Platelets: 89 10*3/uL — ABNORMAL LOW (ref 150–400)
RBC: 5.49 MIL/uL (ref 4.22–5.81)
RDW: 18.6 % — AB (ref 11.5–15.5)
WBC: 8.6 10*3/uL (ref 4.0–10.5)

## 2014-12-04 LAB — COMPREHENSIVE METABOLIC PANEL
ALBUMIN: 2.5 g/dL — AB (ref 3.5–5.0)
ALT: 40 U/L (ref 17–63)
AST: 31 U/L (ref 15–41)
Alkaline Phosphatase: 44 U/L (ref 38–126)
Anion gap: 7 (ref 5–15)
BILIRUBIN TOTAL: 0.3 mg/dL (ref 0.3–1.2)
BUN: 10 mg/dL (ref 6–20)
CO2: 23 mmol/L (ref 22–32)
Calcium: 8.6 mg/dL — ABNORMAL LOW (ref 8.9–10.3)
Chloride: 105 mmol/L (ref 101–111)
Creatinine, Ser: 0.97 mg/dL (ref 0.61–1.24)
GFR calc Af Amer: >60 mL/min (ref >60)
GFR calc non Af Amer: >60 mL/min (ref >60)
GLUCOSE: 165 mg/dL — AB (ref 65–99)
POTASSIUM: 3.7 mmol/L (ref 3.5–5.1)
Sodium: 135 mmol/L (ref 135–145)
TOTAL PROTEIN: 4.9 g/dL — AB (ref 6.5–8.1)

## 2014-12-04 LAB — GI PATHOGEN PANEL BY PCR, STOOL
C difficile toxin A/B: NOT DETECTED
CAMPYLOBACTER BY PCR: NOT DETECTED
CRYPTOSPORIDIUM BY PCR: NOT DETECTED
E COLI (ETEC) LT/ST: NOT DETECTED
E COLI 0157 BY PCR: NOT DETECTED
E coli (STEC): NOT DETECTED
G LAMBLIA BY PCR: NOT DETECTED
Norovirus GI/GII: NOT DETECTED
Rotavirus A by PCR: NOT DETECTED
SHIGELLA BY PCR: NOT DETECTED
Salmonella by PCR: NOT DETECTED

## 2014-12-04 MED ORDER — SODIUM CHLORIDE 0.9 % IJ SOLN
3.0000 mL | Freq: Two times a day (BID) | INTRAMUSCULAR | Status: DC
  Administered 2014-12-04: 3 mL via INTRAVENOUS

## 2014-12-04 MED ORDER — METOPROLOL TARTRATE 1 MG/ML IV SOLN
5.0000 mg | Freq: Once | INTRAVENOUS | Status: AC
  Administered 2014-12-04: 5 mg via INTRAVENOUS
  Filled 2014-12-04: qty 5

## 2014-12-04 MED ORDER — METOPROLOL TARTRATE 25 MG PO TABS
25.0000 mg | ORAL_TABLET | Freq: Two times a day (BID) | ORAL | Status: DC
  Administered 2014-12-04 – 2014-12-05 (×2): 25 mg via ORAL
  Filled 2014-12-04 (×2): qty 1

## 2014-12-04 MED ORDER — LORAZEPAM 1 MG PO TABS
1.0000 mg | ORAL_TABLET | Freq: Two times a day (BID) | ORAL | Status: DC | PRN
  Administered 2014-12-04: 1 mg via ORAL
  Filled 2014-12-04: qty 1

## 2014-12-04 MED ORDER — ACETAMINOPHEN 325 MG PO TABS: 650.0000 mg | ORAL_TABLET | Freq: Four times a day (QID) | ORAL | Status: DC | PRN

## 2014-12-04 MED ORDER — SODIUM CHLORIDE 0.9 % IV SOLN
INTRAVENOUS | Status: DC
  Administered 2014-12-04 – 2014-12-05 (×2): via INTRAVENOUS

## 2014-12-04 MED ORDER — ACETAMINOPHEN 650 MG RE SUPP: 650.0000 mg | Freq: Four times a day (QID) | RECTAL | Status: DC | PRN

## 2014-12-04 NOTE — ED Notes (Signed)
Per EMS: Pt reports being d/c'd from hospital this morning and went to sit down in chair and pt went unresponsive for 5 minutes.  Pt was in hospital for constipation.  Pt alert and oriented.  02 85% on EMS arrival, NRB administered and 02 sat increased.  Pt now on [place].   116/60, 58irreg hr, 148 cbg, 95 % [place]

## 2014-12-04 NOTE — Discharge Summary (Signed)
Physician Discharge Summary  Jared Gill FYB:[id number] DOB: Jan 31, 1941 DOA: 11/30/2014   Admit date: 11/30/2014 Discharge date: 12/04/2014  Discharge Diagnoses: #1. Syncope. #2. Questionable colitis. #3. Metastatic lung carcinoma. #4. Thrombocytopenia. Active Problems:   Syncope   Infectious colitis   UTI (urinary tract infection)   Proctitis    Wt Readings from Last 3 Encounters:  12/04/14 230 lb 13.2 oz (104.7 kg)  11/26/14 175 lb (79.379 kg)  11/25/14 175 lb (79.379 kg)     Hospital Course:  This patient is a 74 year old male who presented after having a syncopal episode in the bathroom. Head CT revealed no sign of stroke or hemorrhage. Previously identified metastatic disease was not shown. There were no dysrhythmias. He was evaluated for diarrhea which had occurred after being treated for constipation. GI pathogen panel is pending. He was treated empirically with Cipro and Flagyl. A CT scan revealed a questionable area of colitis. He was seen in GI consultation. Symptoms resolved. Blood cultures have remained negative. Lactic acid was initially elevated. Urine culture remains pending as well. He is experiencing no fevers. He has been able to eat without difficulty. His primary complaint has been his limited vision. He has had follow-up with oncology with arrangements for additional folic acid and vitamin B-12.  He is improved and stable for discharge on the morning of October 12. Lungs are clear. Heart is regular with no murmurs. Abdomen is soft and nontender. He'll be seen in follow-up in my office in one week. Cultures will be followed until finals are available.  Abdominal CT does reveal 2 new liver lesions consistent with metastatic disease.   Discharge Instructions     Medication List    TAKE these medications        dexamethasone 4 MG tablet  Commonly known as:  DECADRON  Start taking 1 tablet ('4mg'$ ) twice daily with food. On Oct 7, continue to taper as instructed.      folic acid 1 MG tablet  Commonly known as:  FOLVITE  Take 1 tablet by mouth daily and for 21 days after the completion of Pemetrexed chemo.     LORazepam 1 MG tablet  Commonly known as:  ATIVAN  Take 1 mg by mouth 2 (two) times daily as needed for anxiety.     metoprolol tartrate 25 MG tablet  Commonly known as:  LOPRESSOR  Take 1 tablet (25 mg total) by mouth 2 (two) times daily.     omeprazole 20 MG capsule  Commonly known as:  PRILOSEC  1 PO 30 MINS PRIOR TO BREAKFAST AND AT BEDTIME     ondansetron 4 MG disintegrating tablet  Commonly known as:  ZOFRAN ODT  Take 1 tablet (4 mg total) by mouth every 6 (six) hours as needed for nausea.     PEMEtrexed 500 MG injection  Commonly known as:  ALIMTA  Inject into the vein every 21 ( twenty-one) days. To start 12/06/14     polyethylene glycol powder powder  Commonly known as:  GLYCOLAX/MIRALAX  Take 17 g by mouth daily.         Felesha Moncrieffe 12/04/2014

## 2014-12-04 NOTE — Progress Notes (Signed)
Patient with orders to be discharged. Discharge instructions given, patient and family verbalized understanding. Patient stable. Patient left in private vehicle with family.

## 2014-12-04 NOTE — Care Management Note (Signed)
Case Management Note  Patient Details  Name: MARICELA KAWAHARA MRN: [id number] Date of Birth: 07/04/1940   Expected Discharge Date:                  Expected Discharge Plan:  [place]  In-House Referral:  NA  Discharge planning Services  CM Consult  Post Acute Care Choice:  Home Health Choice offered to:  Patient, Sibling  DME Arranged:    DME Agency:     HH Arranged:  RN [place] Agency:  [place]  Status of Service:  Completed, signed off  Medicare Important Message Given:  Yes-second notification given Date Medicare IM Given:    Medicare IM give by:    Date Additional Medicare IM Given:    Additional Medicare Important Message give by:     If discussed at [place] of Stay Meetings, dates discussed:    Additional Comments: Pt discharging home with home health services today. Pt/brother have chosen [facility] for [facility] RN services. Romualdo Bolk, of [facility], made aware and will obtain pt info from chart. Pt/brother made aware HH has 48 hours to make first visit. No further CM needs noted.  Sherald Barge, RN 12/04/2014, 8:03 AM

## 2014-12-04 NOTE — ED Provider Notes (Signed)
CSN: [id number]     Arrival date & time 12/04/14  1050 History  By signing my name below, I, Tula Nakayama, attest that this documentation has been prepared under the direction and in the presence of Milton Ferguson, MD.  Electronically Signed: Tula Nakayama, ED Scribe. 12/04/2014. 11:37 AM.   Chief Complaint  Patient presents with  . Loss of Consciousness   Patient is a 74 y.o. male presenting with syncope. The history is provided by the patient. No language interpreter was used.  Loss of Consciousness Episode history:  Multiple Most recent episode:  Today Progression:  Resolved Chronicity:  Recurrent Context: standing up   Witnessed: yes   Associated symptoms: malaise/fatigue, visual change and weakness   Associated symptoms: no chest pain, no headaches and no seizures     HPI Comments: Jared Gill is a 74 y.o. male accompanied by family who presents to the Emergency Department complaining of one episode of syncope that occurred PTA after he stood from a standing position. Pt reports vision loss, general fatigue and weakness as associated symptoms. He also notes feeling dizzy when episode occurred. Pt was last seen in the ED on 10/8 after a syncopal episode. He was admitted to the hospital for the same and was discharged from the hospital approximately 1 hour before new syncope. Pt denies fever.   Past Medical History  Diagnosis Date  . Hypertension   . Abnormal MRI, spine 05/12/2011  . Nausea 01/2014  . BPH (benign prostatic hypertrophy)   . Hx of adenomatous colonic polyps     h/o tubulovillous adenomas  . Diverticulitis   . Lung mass     Right Upper Lobe  . GERD (gastroesophageal reflux disease)   . Blurred vision   . Brain metastasis ([place])   . Metastasis to brain ([place])   . Brain metastases ([place])   . Brain cancer ([place])   . Lung cancer ([place])   . Brain cancer [place]) Brain cancer   Past Surgical History  Procedure Laterality Date  . Cholecystectomy  2009  . Polyp  removal via colonoscopy    . Colonoscopy with esophagogastroduodenoscopy (egd) N/A 04/06/2013    RAQ:[id number] size internal hemorrhoids/moderate diverticulosis/medium sized HH  . Tonsilectomy, adenoidectomy, bilateral myringotomy and tubes    . Video bronchoscopy with endobronchial ultrasound N/A 10/29/2014    Procedure: ATTEMPTED VIDEO BRONCHOSCOPY WITH ENDOBRONCHIAL ULTRASOUND;  Surgeon: Javier Glazier, MD;  Location: [place];  Service: Thoracic;  Laterality: N/A;  . Mediastinoscopy N/A 11/07/2014    Procedure: MEDIASTINOSCOPY;  Surgeon: Melrose Nakayama, MD;  Location: [place];  Service: Thoracic;  Laterality: N/A;   Family History  Problem Relation Age of Onset  . Diabetes Father   . Diabetes Sister   . Lung cancer Brother   . CVA Brother   . Colon cancer Neg Hx   . CAD Brother   . Heart attack Neg Hx   . Hypertension Brother   . Stroke Brother    Social History  Substance Use Topics  . Smoking status: Former Smoker -- 0.50 packs/day for 40 years    Types: Cigarettes    Quit date: 10/17/2009  . Smokeless tobacco: Never Used  . Alcohol Use: No     Comment: Remote EtOH   Review of Systems  Constitutional: Positive for malaise/fatigue and fatigue. Negative for appetite change.  HENT: Negative for congestion, ear discharge and sinus pressure.   Eyes: Positive for visual disturbance. Negative for discharge.  Respiratory: Negative for cough.  Cardiovascular: Positive for syncope. Negative for chest pain.  Gastrointestinal: Negative for abdominal pain and diarrhea.  Genitourinary: Negative for frequency and hematuria.  Musculoskeletal: Negative for back pain.  Skin: Negative for rash.  Neurological: Positive for weakness. Negative for seizures and headaches.  Psychiatric/Behavioral: Negative for hallucinations.   Allergies  Reglan  Home Medications   Prior to Admission medications   Medication Sig Start Date End Date Taking? Authorizing Provider  dexamethasone  (DECADRON) 4 MG tablet Start taking 1 tablet ('4mg'$ ) twice daily with food. On Oct 7, continue to taper as instructed. Patient taking differently: Take 4 mg by mouth daily. continue to taper as instructed. 11/15/14   Eppie Gibson, MD  folic acid (FOLVITE) 1 MG tablet Take 1 tablet by mouth daily and for 21 days after the completion of Pemetrexed chemo. 12/02/14   Patrici Ranks, MD  LORazepam (ATIVAN) 1 MG tablet Take 1 mg by mouth 2 (two) times daily as needed for anxiety.  11/11/14   Historical Provider, MD  metoprolol tartrate (LOPRESSOR) 25 MG tablet Take 1 tablet (25 mg total) by mouth 2 (two) times daily. 11/08/14   Coolidge Breeze, PA-C  omeprazole (PRILOSEC) 20 MG capsule 1 PO 30 MINS PRIOR TO BREAKFAST AND AT BEDTIME 08/22/14   Danie Binder, MD  ondansetron (ZOFRAN ODT) 4 MG disintegrating tablet Take 1 tablet (4 mg total) by mouth every 6 (six) hours as needed for nausea. Patient taking differently: Take 4 mg by mouth at bedtime.  05/09/14   Danie Binder, MD  PEMEtrexed (ALIMTA) 500 MG injection Inject into the vein every 21 ( twenty-one) days. To start 12/06/14    Historical Provider, MD  polyethylene glycol powder (GLYCOLAX/MIRALAX) powder Take 17 g by mouth daily.    Historical Provider, MD   BP 100/70 mmHg  Pulse 62  Temp(Src) 97.5 F (36.4 C) (Oral)  Resp 27  Ht 6' (1.829 m)  Wt 230 lb (104.327 kg)  BMI 31.19 kg/m2  SpO2 99% Physical Exam  Constitutional: He is oriented to person, place, and time. He appears well-developed.  HENT:  Head: Normocephalic.  Eyes: Conjunctivae and EOM are normal. No scleral icterus.  Neck: Neck supple. No thyromegaly present.  Cardiovascular: Exam reveals no gallop and no friction rub.   No murmur heard. Rapid, irregular heart beat  Pulmonary/Chest: No stridor. He has no wheezes. He has no rales. He exhibits no tenderness.  Abdominal: He exhibits no distension. There is no tenderness. There is no rebound.  Musculoskeletal: Normal range of  motion. He exhibits no edema.  Lymphadenopathy:    He has no cervical adenopathy.  Neurological: He is oriented to person, place, and time. He exhibits normal muscle tone. Coordination normal.  Skin: No rash noted. No erythema.  Psychiatric: He has a normal mood and affect. His behavior is normal.  Nursing note and vitals reviewed.   ED Course  Procedures   DIAGNOSTIC STUDIES: Oxygen Saturation is 99% on RA, normal by my interpretation.    COORDINATION OF CARE: 11:38 AM Discussed treatment plan with pt and his family which includes lab work. They agreed to plan.   Labs Review Labs Reviewed  CBC WITH DIFFERENTIAL/PLATELET  COMPREHENSIVE METABOLIC PANEL  I-STAT TROPOININ, ED    Imaging Review No results found. I have personally reviewed and evaluated these lab results as part of my medical decision-making.   EKG Interpretation   Date/Time:  Wednesday December 04 2014 10:56:30 EDT Ventricular Rate:  123 PR Interval:  123  QRS Duration: 103 QT Interval:  337 QTC Calculation: 482 R Axis:   -110 Text Interpretation:  Sinus tachycardia Ventricular premature complex Left  anterior fascicular block Low voltage, extremity and precordial leads  Abnormal R-wave progression, late transition Borderline prolonged QT  interval Confirmed by Nguyen Butler  MD, Broadus John ([id number]) on 12/04/2014 1:39:38 PM      MDM   Final diagnoses:  None    Patient with syncope and irregular heartbeat patient also has metastatic lung CA. He just was admitted for syncope but no cause was found. I spoke to Dr. Luan Pulling who is on-call for Dr. Willey Blade. It was decided to admit the patient for syncope to telemetry   The chart was scribed for me under my direct supervision.  I personally performed the history, physical, and medical decision making and all procedures in the evaluation of this patient.Milton Ferguson, MD 12/04/14 [phone]

## 2014-12-04 NOTE — Care Management Important Message (Signed)
Important Message  Patient Details  Name: SAMIL MECHAM MRN: [id number] Date of Birth: [birth year]   Medicare Important Message Given:  Yes-second notification given    Sherald Barge, RN 12/04/2014, 8:03 AM

## 2014-12-04 NOTE — H&P (Signed)
History and Physical  Jared Gill:[id number] DOB: [birth year] DOA: 12/04/2014  Referring physician: Dr. Roderic Palau in Ed PCP: Asencion Noble, MD   Chief Complaint: passed out  HPI:  54yom PMH stage IV NSCLC with metastatic brain disease hospitalized 10/8-10/12 for syncope and possible colitis. He was d/c home today and at home had another episode of syncope and came back to ED where obs was requested  Patient reports getting out of car at home, upon standing became lightheaded, then passed out but brother caught him preventing self injury. No focal neurologic symptoms, no chest pain. He was brought inside the house, close were changed and he was brought back to the emergency department for further evaluation. No focal weakness.  On last evaluation for syncope his telemetry was noted to have no events head CT was noted to be unremarkable. Additionally echocardiogram from September 16 was unremarkable. He is on lorazepam, metoprolol. EDP attempted to contact Dr. Willey Blade but was unable to get in touch with him.  In the emergency department afebrile, vital signs stable, no hypoxia Pertinent labs: Basic metabolic panel, hepatic function panel unremarkable. Platelet count stable at 89. CBC otherwise unremarkable. EKG: Independently reviewed. Sinus tachycardia, low voltage, poor quality study.  Review of Systems:  Negative for fever, new visual changes, sore throat, rash, new muscle aches, chest pain, SOB, dysuria, bleeding, n/v/abdominal pain.  Past Medical History  Diagnosis Date  . Hypertension   . Abnormal MRI, spine 05/12/2011  . Nausea 01/2014  . BPH (benign prostatic hypertrophy)   . Hx of adenomatous colonic polyps     h/o tubulovillous adenomas  . Diverticulitis   . Lung mass     Right Upper Lobe  . GERD (gastroesophageal reflux disease)   . Blurred vision   . Brain metastasis ([place])   . Brain cancer ([place])   . Lung cancer [facility])     Past Surgical History  Procedure Laterality Date    . Cholecystectomy  2009  . Polyp removal via colonoscopy    . Colonoscopy with esophagogastroduodenoscopy (egd) N/A 04/06/2013    GEX:[id number] size internal hemorrhoids/moderate diverticulosis/medium sized HH  . Tonsilectomy, adenoidectomy, bilateral myringotomy and tubes    . Video bronchoscopy with endobronchial ultrasound N/A 10/29/2014    Procedure: ATTEMPTED VIDEO BRONCHOSCOPY WITH ENDOBRONCHIAL ULTRASOUND;  Surgeon: Javier Glazier, MD;  Location: [place];  Service: Thoracic;  Laterality: N/A;  . Mediastinoscopy N/A 11/07/2014    Procedure: MEDIASTINOSCOPY;  Surgeon: Melrose Nakayama, MD;  Location: [place];  Service: Thoracic;  Laterality: N/A;    Social History:  reports that he quit smoking about 5 years ago. His smoking use included Cigarettes. He has a 20 pack-year smoking history. He has never used smokeless tobacco. He reports that he does not drink alcohol or use illicit drugs.    Allergies  Allergen Reactions  . Reglan [Metoclopramide]     ANXIETY, AGITATION, JITTERY    Family History  Problem Relation Age of Onset  . Diabetes Father   . Diabetes Sister   . Lung cancer Brother   . CVA Brother   . Colon cancer Neg Hx   . CAD Brother   . Heart attack Neg Hx   . Hypertension Brother   . Stroke Brother      Prior to Admission medications   Medication Sig Start Date End Date Taking? Authorizing Provider  dexamethasone (DECADRON) 4 MG tablet Start taking 1 tablet ('4mg'$ ) twice daily with food. On Oct 7, continue to taper as instructed.  Patient taking differently: Take 4 mg by mouth daily. continue to taper as instructed. 11/15/14  Yes Eppie Gibson, MD  folic acid (FOLVITE) 1 MG tablet Take 1 tablet by mouth daily and for 21 days after the completion of Pemetrexed chemo. 12/02/14  Yes Patrici Ranks, MD  LORazepam (ATIVAN) 1 MG tablet Take 1 mg by mouth 2 (two) times daily as needed for anxiety.  11/11/14  Yes Historical Provider, MD  metoprolol tartrate (LOPRESSOR) 25  MG tablet Take 1 tablet (25 mg total) by mouth 2 (two) times daily. 11/08/14  Yes Coolidge Breeze, PA-C  omeprazole (PRILOSEC) 20 MG capsule 1 PO 30 MINS PRIOR TO BREAKFAST AND AT BEDTIME 08/22/14  Yes Danie Binder, MD  ondansetron (ZOFRAN ODT) 4 MG disintegrating tablet Take 1 tablet (4 mg total) by mouth every 6 (six) hours as needed for nausea. Patient taking differently: Take 4 mg by mouth at bedtime.  05/09/14  Yes Danie Binder, MD  PEMEtrexed (ALIMTA) 500 MG injection Inject into the vein every 21 ( twenty-one) days. To start 12/06/14   Yes Historical Provider, MD  polyethylene glycol powder (GLYCOLAX/MIRALAX) powder Take 17 g by mouth daily.   Yes Historical Provider, MD   Physical Exam: Filed Vitals:   12/04/14 1500 12/04/14 1515 12/04/14 1530 12/04/14 1606  BP: 113/72  112/67 132/90  Pulse:  101 103 50  Temp:    97.5 F (36.4 C)  TempSrc:    Oral  Resp: '26 23 23 20  '$ Height:    '5\' 11"'$  (1.803 m)  Weight:      SpO2:  95% 97% 98%     General: Appears calm and comfortable, eating dinner Eyes: PERRL, normal lids, irises. Wears glasses. ENT: grossly normal hearing, lips & tongue Neck: no LAD, masses or thyromegaly Cardiovascular: RRR, no m/r/g. No LE edema. Respiratory: CTA bilaterally, no w/r/r. Normal respiratory effort. Abdomen: soft, ntnd Skin: no rash or induration noted Musculoskeletal: grossly normal tone BUE/BLE; strength all extremities appears grossly normal and symmetric Psychiatric: grossly normal mood and affect, speech fluent and appropriate Neurologic: no focal deficits noted, CN appear intact  Wt Readings from Last 3 Encounters:  12/04/14 104.327 kg (230 lb)  12/04/14 104.7 kg (230 lb 13.2 oz)  11/26/14 79.379 kg (175 lb)    Labs on Admission:  Basic Metabolic Panel:  Recent Labs Lab 11/30/14 1319 12/01/14 0555 12/02/14 0516 12/03/14 0605 12/04/14 1157  NA 131* 135 136 136 135  K 3.9 4.1 4.1 3.7 3.7  CL 100* 106 106 106 105  CO2 '22 23 25 24 23    '$ GLUCOSE 186* 115* 94 92 165*  BUN '17 9 8 10 10  '$ CREATININE 1.01 0.65 0.68 0.69 0.97  CALCIUM 7.7* 7.4* 7.6* 7.6* 8.6*    Liver Function Tests:  Recent Labs Lab 11/30/14 1319 12/04/14 1157  AST 31 31  ALT 39 40  ALKPHOS 41 44  BILITOT 0.8 0.3  PROT 5.0* 4.9*  ALBUMIN 2.7* 2.5*    CBC:  Recent Labs Lab 11/30/14 1319 12/02/14 0516 12/04/14 1157  WBC 10.3 9.5 8.6  NEUTROABS 8.7* 7.9* 7.1  HGB 14.5 12.9* 13.3  HCT 41.7 37.9* 39.5  MCV 72.0* 72.2* 71.9*  PLT 64* 77* 89*    CBG:  Recent Labs Lab 12/01/14 0701 12/02/14 0645  GLUCAP 110* 96    Principal Problem:   Syncope Active Problems:   Brain metastasis (HCC)   Non-small cell carcinoma of lung (HCC)   Thrombocytopenia ([place])  Assessment/Plan 1. Recurrent syncope. History highly suggestive of vasovagal phenomenon or orthostasis. Echocardiogram within the last 60 days unremarkable. Recent CT head was unremarkable for well. He does have known metastatic disease to the brain which is followed in the outpatient setting. Not sure that any further imaging with bear fruit at this point. Favor benign etiology. 2. Stage IV non-small cell lung cancer 3. Chronic thrombocytopenia, stable   Plan observation  IV fluids, check orthostatics now and in the morning  Hold off on further imaging given recent workup  PT consult  May be able to go home next 24 hours if no events on telemetry. No history to suggest acute CNS process or ACS.  Code Status: full codd  DVT prophylaxis: SCDs Family Communication: none present Disposition Plan/Anticipated LOS: obs, 24 hours  Time spent: 60 minutes  Murray Hodgkins, MD  [facility] Pager [phone] 12/04/2014, 5:16 PM

## 2014-12-05 LAB — CULTURE, BLOOD (ROUTINE X 2)
Culture: NO GROWTH
Culture: NO GROWTH

## 2014-12-05 LAB — STOOL CULTURE

## 2014-12-05 MED ORDER — SULFAMETHOXAZOLE-TRIMETHOPRIM 800-160 MG PO TABS: 1.0000 | ORAL_TABLET | Freq: Two times a day (BID) | ORAL | Status: DC

## 2014-12-05 NOTE — Progress Notes (Deleted)
PT Cancellation Note  Patient Details Name: Jared Gill MRN: [id number] DOB: 24-Oct-1940   Cancelled Treatment:    Reason Eval/Treat Not Completed: Other (comment).  Pt is being discharged today and per MD report he is back to his baseline with all d/c services set up.  We will not do a PT eval unless directed otherwise.  Thanks.   Demetrios Isaacs L  PT 12/05/2014, 8:13 AM [phone]

## 2014-12-05 NOTE — Care Management Note (Signed)
Case Management Note  Patient Details  Name: Jared Gill MRN: [id number] Date of Birth: 1940-11-29  Subjective/Objective:                    Action/Plan:   Expected Discharge Date:                  Expected Discharge Plan:  [place]  In-House Referral:  NA  Discharge planning Services  CM Consult  Post Acute Care Choice:  Home Health Choice offered to:  Patient  DME Arranged:  Bedside commode, Walker rolling DME Agency:  [place] Arranged:  RN, PT [facility] Agency:  [place]  Status of Service:  Completed, signed off  Medicare Important Message Given:    Date Medicare IM Given:    Medicare IM give by:    Date Additional Medicare IM Given:    Additional Medicare Important Message give by:     If discussed at [place] of Stay Meetings, dates discussed:    Additional Comments: Pt discharged home today with [facility] RN and PT (per pts choice). Romualdo Bolk of [facility] is aware and will collect the pts information from the chart. [place] services to start within 48 hours of discharge. RW and BSC to be delivered to pts room prior to discharge from [facility] (per pts choice). Pt and pts nurse aware of discharge arrangements. Christinia Gully Maupin, RN 12/05/2014, 9:31 AM

## 2014-12-05 NOTE — Progress Notes (Signed)
Patient discharged home. IV removed - WNL.  DC instructions reviewed with family, emphasized importance of completing whole dose of abx.  No questions at this time.  FU in place with PCP.  Stable to DC at this time, assisted off unit via WC by NT.

## 2014-12-05 NOTE — Evaluation (Addendum)
Physical Therapy Evaluation Patient Details Name: Jared Gill MRN: [id number] DOB: 05-Feb-1941 Today's Date: 12/05/2014   History of Present Illness  Jared Gill is a 74 y.o. male accompanied by family who presents to the Emergency Department complaining of one episode of syncope that occurred PTA after he stood from a standing position. Pt reports vision loss, general fatigue and weakness as associated symptoms. He also notes feeling dizzy when episode occurred. Pt was last seen in the ED on 10/8 after a syncopal episode. He was admitted to the hospital for the same and was discharged from the hospital approximately 1 hour before new syncope. Pt denies fever.  Clinical Impression   Pt was seen for evaluation prior to discharge.  He was lethargic but awake and cooperative.  His brother was present during my visit.  Pt lives alone but has 24 hour supervision by his family.  He had been independent with gait and transfers, no assistive device PTA.  He did need assist with bathing and household activities.  He is currently significantly weakened and deconditioned.  He does need a walker now for gait and is mildly unsteady with it.  His strength is equal from left to right but he does have a mild tremor in both UEs.  We discussed at length various DME that might be needed in the home and I did recommend that his brother acquire a w/c as it might very well be needed in the future.  He says that he can borrow one from a friend.  He will receive HHPT at d/c.    Follow Up Recommendations Home health PT    Equipment Recommendations  Rolling walker with 5" wheels;3in1 (PT)    Recommendations for Other Services   none    Precautions / Restrictions Precautions Precautions: Fall Restrictions Weight Bearing Restrictions: No      Mobility  Bed Mobility Overal bed mobility: Modified Independent             General bed mobility comments: very slow but no assist needed  Transfers Overall  transfer level: Needs assistance Equipment used: Rolling walker (2 wheeled) Transfers: Sit to/from Stand Sit to Stand: Supervision         General transfer comment: mild instability upon standing, needs a walker for stabilization  Ambulation/Gait Ambulation/Gait assistance: Min guard Ambulation Distance (Feet): 40 Feet Assistive device: Rolling walker (2 wheeled) Gait Pattern/deviations: Trunk flexed;Decreased dorsiflexion - right;Decreased dorsiflexion - left   Gait velocity interpretation: <1.8 ft/sec, indicative of risk for recurrent falls                           Balance Overall balance assessment: Needs assistance Sitting-balance support: No upper extremity supported;Feet supported Sitting balance-Leahy Scale: Good     Standing balance support: Bilateral upper extremity supported Standing balance-Leahy Scale: Fair                               Pertinent Vitals/Pain Pain Assessment: No/denies pain    Home Living Family/patient expects to be discharged to:: Private residence Living Arrangements: Alone Available Help at Discharge: Family;Available 24 hours/day Type of Home: House Home Access: Stairs to enter Entrance Stairs-Rails: None Entrance Stairs-Number of Steps: 1 Home Layout: One level Home Equipment: None      Prior Function Level of Independence: Needs assistance   Gait / Transfers Assistance Needed: independent with gait and transfers  ADL's /  Homemaking Assistance Needed: assist with meals, homemaking and bathing        Hand Dominance        Extremity/Trunk Assessment   Upper Extremity Assessment: Generalized weakness           Lower Extremity Assessment: Generalized weakness      Cervical / Trunk Assessment: Kyphotic  Communication   Communication: No difficulties  Cognition Arousal/Alertness: Lethargic Behavior During Therapy: WFL for tasks assessed/performed Overall Cognitive Status: Within  Functional Limits for tasks assessed                      General Comments      Exercises        Assessment/Plan    PT Assessment All further PT needs can be met in the next venue of care  PT Diagnosis Difficulty walking;Generalized weakness   PT Problem List Decreased strength;Decreased activity tolerance;Decreased balance;Decreased mobility  PT Treatment Interventions     PT Goals (Current goals can be found in the Care Plan section) Acute Rehab PT Goals PT Goal Formulation: All assessment and education complete, DC therapy    Frequency     Barriers to discharge        Co-evaluation               End of Session Equipment Utilized During Treatment: Gait belt Activity Tolerance: Patient limited by fatigue Patient left: in chair;with call bell/phone within reach;with family/visitor present Nurse Communication: Mobility status    Functional Assessment Tool Used:  (clinical judgement) Functional Limitation: Mobility: Walking and moving around Mobility: Walking and Moving Around Current Status (M3846): At least 1 percent but less than 20 percent impaired, limited or restricted Mobility: Walking and Moving Around Goal Status [phone]): At least 1 percent but less than 20 percent impaired, limited or restricted Mobility: Walking and Moving Around Discharge Status [phone]): At least 1 percent but less than 20 percent impaired, limited or restricted    Time: 0840-0922 PT Time Calculation (min) (ACUTE ONLY): 42 min   Charges:   PT Evaluation $Initial PT Evaluation Tier I: 1 Procedure PT Treatments $Self Care/Home Management: 8-22   PT G Codes:   PT G-Codes **NOT FOR INPATIENT CLASS** Functional Assessment Tool Used:  (clinical judgement) Functional Limitation: Mobility: Walking and moving around Mobility: Walking and Moving Around Current Status (B9390): At least 1 percent but less than 20 percent impaired, limited or restricted Mobility: Walking and Moving  Around Goal Status [phone]): At least 1 percent but less than 20 percent impaired, limited or restricted Mobility: Walking and Moving Around Discharge Status [phone]): At least 1 percent but less than 20 percent impaired, limited or restricted    Sable Feil  PT 12/05/2014, 9:47 AM [phone]

## 2014-12-05 NOTE — Care Management Note (Signed)
Case Management Note  Patient Details  Name: Jared Gill MRN: [id number] Date of Birth: 01/29/1941  Subjective/Objective:                  Pt admitted from home with syncope episode. Pt lives alone and will return home. Pts brother is staying with pt for a while after discharge. Pt had been fairly independent with ADL's. Pt was arranged [facility] RN services with [facility] but [facility] had not started services prior to readmission.  Action/Plan: Awaiting PT consult for any DME needs. Will add HH PT to home health orders. Will continue to follow for discharge planning needs.  Expected Discharge Date:                  Expected Discharge Plan:  [place]  In-House Referral:  NA  Discharge planning Services  CM Consult  Post Acute Care Choice:  Home Health Choice offered to:  Patient  DME Arranged:    DME Agency:     HH Arranged:  RN, PT [place] Agency:  [place]  Status of Service:  In process, will continue to follow  Medicare Important Message Given:    Date Medicare IM Given:    Medicare IM give by:    Date Additional Medicare IM Given:    Additional Medicare Important Message give by:     If discussed at [place] of Stay Meetings, dates discussed:    Additional Comments:  Joylene Draft, RN 12/05/2014, 8:21 AM

## 2014-12-05 NOTE — Discharge Summary (Signed)
Physician Discharge Summary  DEKLYN GIBBON BDZ:[id number] DOB: [birth year] DOA: 12/04/2014   Admit date: 12/04/2014 Discharge date: 12/05/2014  Discharge Diagnoses: #1. Principal Problem:   Syncope Active Problems:   Brain metastasis (HCC)   Non-small cell carcinoma of lung (HCC)   Thrombocytopenia ([place])    Wt Readings from Last 3 Encounters:  12/04/14 230 lb (104.327 kg)  12/04/14 230 lb 13.2 oz (104.7 kg)  11/26/14 175 lb (79.379 kg)     Hospital Course:  This patient is a 74 year old male who was readmitted to the hospital yesterday after experiencing another episode of syncope upon returning home. This occurred after having a stool which was loose. His GI pathogen panel is negative. Blood cultures are negative from his last hospitalization. His urine culture has revealed a coagulase-negative staph. He is not febrile. Vital signs are now normal. He is comfortable now and at his baseline neurologically. He is able to eat but states his appetite is diminished. His thrombocytopenia is mildly improved.  It is felt that he likely experienced a vasovagal episode again. He has been observed and hydrated overnight. He appears stable for discharge home. We'll consult home health with home health physical therapy. His UTI will be treated with Bactrim DS. He will have follow-up in my office in one week. He will follow up with the oncology clinic as scheduled.  His discharge exam reveals clear lungs with a regular heart rhythm and a nontender abdomen. General condition is improved since admission.   Discharge Instructions     Medication List    STOP taking these medications        polyethylene glycol powder powder  Commonly known as:  GLYCOLAX/MIRALAX      TAKE these medications        dexamethasone 4 MG tablet  Commonly known as:  DECADRON  Start taking 1 tablet ('4mg'$ ) twice daily with food. On Oct 7, continue to taper as instructed.     folic acid 1 MG tablet  Commonly known  as:  FOLVITE  Take 1 tablet by mouth daily and for 21 days after the completion of Pemetrexed chemo.     LORazepam 1 MG tablet  Commonly known as:  ATIVAN  Take 1 mg by mouth 2 (two) times daily as needed for anxiety.     metoprolol tartrate 25 MG tablet  Commonly known as:  LOPRESSOR  Take 1 tablet (25 mg total) by mouth 2 (two) times daily.     omeprazole 20 MG capsule  Commonly known as:  PRILOSEC  1 PO 30 MINS PRIOR TO BREAKFAST AND AT BEDTIME     ondansetron 4 MG disintegrating tablet  Commonly known as:  ZOFRAN ODT  Take 1 tablet (4 mg total) by mouth every 6 (six) hours as needed for nausea.     PEMEtrexed 500 MG injection  Commonly known as:  ALIMTA  Inject into the vein every 21 ( twenty-one) days. To start 12/06/14     sulfamethoxazole-trimethoprim 800-160 MG tablet  Commonly known as:  BACTRIM DS,SEPTRA DS  Take 1 tablet by mouth 2 (two) times daily.         Keandrea Tapley 12/05/2014

## 2014-12-06 ENCOUNTER — Encounter (HOSPITAL_COMMUNITY): Payer: Self-pay | Admitting: Hematology & Oncology

## 2014-12-06 ENCOUNTER — Encounter (HOSPITAL_COMMUNITY): Payer: Commercial Managed Care - HMO | Attending: Hematology & Oncology

## 2014-12-06 ENCOUNTER — Ambulatory Visit: Payer: Self-pay | Admitting: Radiation Oncology

## 2014-12-06 ENCOUNTER — Encounter (HOSPITAL_COMMUNITY): Payer: Commercial Managed Care - HMO | Attending: Hematology & Oncology | Admitting: Hematology & Oncology

## 2014-12-06 VITALS — BP 116/67 | HR 87 | Temp 97.7°F | Resp 20 | Wt 180.0 lb

## 2014-12-06 VITALS — BP 105/72 | HR 81 | Temp 97.5°F | Resp 18

## 2014-12-06 DIAGNOSIS — C7931 Secondary malignant neoplasm of brain: Secondary | ICD-10-CM

## 2014-12-06 DIAGNOSIS — C3490 Malignant neoplasm of unspecified part of unspecified bronchus or lung: Secondary | ICD-10-CM

## 2014-12-06 DIAGNOSIS — D696 Thrombocytopenia, unspecified: Secondary | ICD-10-CM | POA: Diagnosis not present

## 2014-12-06 DIAGNOSIS — C3491 Malignant neoplasm of unspecified part of right bronchus or lung: Secondary | ICD-10-CM | POA: Diagnosis not present

## 2014-12-06 DIAGNOSIS — Z5111 Encounter for antineoplastic chemotherapy: Secondary | ICD-10-CM

## 2014-12-06 DIAGNOSIS — B370 Candidal stomatitis: Secondary | ICD-10-CM | POA: Insufficient documentation

## 2014-12-06 LAB — COMPREHENSIVE METABOLIC PANEL
ALBUMIN: 2.5 g/dL — AB (ref 3.5–5.0)
ALK PHOS: 48 U/L (ref 38–126)
ALT: 43 U/L (ref 17–63)
AST: 36 U/L (ref 15–41)
Anion gap: 10 (ref 5–15)
BUN: 12 mg/dL (ref 6–20)
CHLORIDE: 104 mmol/L (ref 101–111)
CO2: 21 mmol/L — AB (ref 22–32)
CREATININE: 0.81 mg/dL (ref 0.61–1.24)
Calcium: 8.4 mg/dL — ABNORMAL LOW (ref 8.9–10.3)
GFR calc Af Amer: >60 mL/min (ref >60)
GFR calc non Af Amer: >60 mL/min (ref >60)
GLUCOSE: 130 mg/dL — AB (ref 65–99)
Potassium: 3.7 mmol/L (ref 3.5–5.1)
SODIUM: 135 mmol/L (ref 135–145)
Total Bilirubin: 0.5 mg/dL (ref 0.3–1.2)
Total Protein: 5.1 g/dL — ABNORMAL LOW (ref 6.5–8.1)

## 2014-12-06 LAB — CBC WITH DIFFERENTIAL/PLATELET
Basophils Absolute: 0 10*3/uL (ref 0.0–0.1)
Basophils Relative: 0 %
EOS ABS: 0 10*3/uL (ref 0.0–0.7)
Eosinophils Relative: 0 %
HCT: 37.8 % — ABNORMAL LOW (ref 39.0–52.0)
HEMOGLOBIN: 13.1 g/dL (ref 13.0–17.0)
LYMPHS ABS: 0.4 10*3/uL — AB (ref 0.7–4.0)
Lymphocytes Relative: 4 %
MCH: 24.6 pg — AB (ref 26.0–34.0)
MCHC: 34.7 g/dL (ref 30.0–36.0)
MCV: 71.1 fL — ABNORMAL LOW (ref 78.0–100.0)
MONO ABS: 0.6 10*3/uL (ref 0.1–1.0)
MONOS PCT: 5 %
NEUTROS PCT: 91 %
Neutro Abs: 10.3 10*3/uL — ABNORMAL HIGH (ref 1.7–7.7)
Platelets: 111 10*3/uL — ABNORMAL LOW (ref 150–400)
RBC: 5.32 MIL/uL (ref 4.22–5.81)
RDW: 18.6 % — AB (ref 11.5–15.5)
WBC: 11.4 10*3/uL — ABNORMAL HIGH (ref 4.0–10.5)

## 2014-12-06 MED ORDER — SODIUM CHLORIDE 0.9 % IV SOLN
Freq: Once | INTRAVENOUS | Status: AC
  Administered 2014-12-06: 10:00:00 via INTRAVENOUS
  Filled 2014-12-06: qty 4

## 2014-12-06 MED ORDER — SODIUM CHLORIDE 0.9 % IV SOLN
375.0000 mg/m2 | Freq: Once | INTRAVENOUS | Status: AC
  Administered 2014-12-06: 875 mg via INTRAVENOUS
  Filled 2014-12-06: qty 35

## 2014-12-06 MED ORDER — SODIUM CHLORIDE 0.9 % IJ SOLN: 10.0000 mL | INTRAMUSCULAR | Status: DC | PRN

## 2014-12-06 MED ORDER — SODIUM CHLORIDE 0.9 % IV SOLN
Freq: Once | INTRAVENOUS | Status: AC
  Administered 2014-12-06: 10:00:00 via INTRAVENOUS

## 2014-12-06 NOTE — Patient Instructions (Signed)
[place] at [facility] Discharge Instructions  RECOMMENDATIONS MADE BY THE CONSULTANT AND ANY TEST RESULTS WILL BE SENT TO YOUR REFERRING PHYSICIAN.  Exam per Dr.Penland. Chemo today as ordered. Return as scheduled.  Thank you for choosing [place] at [facility] to provide your oncology and hematology care.  To afford each patient quality time with our provider, please arrive at least 15 minutes before your scheduled appointment time.    You need to re-schedule your appointment should you arrive 10 or more minutes late.  We strive to give you quality time with our providers, and arriving late affects you and other patients whose appointments are after yours.  Also, if you no show three or more times for appointments you may be dismissed from the clinic at the providers discretion.     Again, thank you for choosing [facility].  Our hope is that these requests will decrease the amount of time that you wait before being seen by our physicians.       _____________________________________________________________  Should you have questions after your visit to [facility], please contact our office at [phone] between the hours of 8:30 a.m. and 4:30 p.m.  Voicemails left after 4:30 p.m. will not be returned until the following business day.  For prescription refill requests, have your pharmacy contact our office.

## 2014-12-06 NOTE — Progress Notes (Signed)
Ok to treat per Dr Whitney Muse   Jared Gill Tolerated chemotherapy well today Discharged ambulatory

## 2014-12-06 NOTE — Patient Instructions (Signed)
[place] [facility] Discharge Instructions for Patients Receiving Chemotherapy  Today you received the following chemotherapy agents alimtia Follow up as scheduled Please call the clinic if you have any questions or concerns  To help prevent nausea and vomiting after your treatment, we encourage you to take your nausea medication    If you develop nausea and vomiting, or diarrhea that is not controlled by your medication, call the clinic.  The clinic phone number is [phone]. Office hours are Monday-Friday 8:30am-5:00pm.  BELOW ARE SYMPTOMS THAT SHOULD BE REPORTED IMMEDIATELY:  *FEVER GREATER THAN 101.0 F  *CHILLS WITH OR WITHOUT FEVER  NAUSEA AND VOMITING THAT IS NOT CONTROLLED WITH YOUR NAUSEA MEDICATION  *UNUSUAL SHORTNESS OF BREATH  *UNUSUAL BRUISING OR BLEEDING  TENDERNESS IN MOUTH AND THROAT WITH OR WITHOUT PRESENCE OF ULCERS  *URINARY PROBLEMS  *BOWEL PROBLEMS  UNUSUAL RASH Items with * indicate a potential emergency and should be followed up as soon as possible. If you have an emergency after office hours please contact your primary care physician or go to the nearest emergency department.  Please call the clinic during office hours if you have any questions or concerns.   You may also contact the Patient Navigator at [phone] should you have any questions or need assistance in obtaining follow up care. _____________________________________________________________________ Have you asked about our STAR program?    STAR stands for Survivorship Training and Rehabilitation, and this is a nationally recognized cancer care program that focuses on survivorship and rehabilitation.  Cancer and cancer treatments may cause problems, such as, pain, making you feel tired and keeping you from doing the things that you need or want to do. Cancer rehabilitation can help. Our goal is to reduce these troubling effects and help you have the best quality of life  possible.  You may receive a survey from a nurse that asks questions about your current state of health.  Based on the survey results, all eligible patients will be referred to the [facility] program for an evaluation so we can better serve you! A frequently asked questions sheet is available upon request.

## 2014-12-06 NOTE — Progress Notes (Signed)
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 male is here for follow-up of stage IV NSCLC, adenocarcinoma.  He has completed XRT with Dr. Isidore Moos. Final pathology is c/w adenocarcinoma, [facility] testing was negative for targetable mutations.   The patient has been to the Opthalmologist.  He passed his eye exam.  He was told his tumor was effecting his eyesight.  He reports that he cannot see peripherally,  "feeling like someone puts a shade on the sides" of his eyes.  He mentions repeatedly throughout our visit that he wants his eyesight back, "maybe I need to go to [facility]" and "I should just leave it to the [facility]."   He says the state of his eyes is making it really "rough." His vision is really bothering him, and everything's "just blurry." He says, "I want to see," and seems very discouraged about the state of his eyesight. He says "I've been like that for weeks, and I'm tired."  He says he's been eating good and that he sleeps pretty good. He says he fell at home, and he "believes he was in his kitchen." he says he doesn't really know if he hit his head. He remembers coming to the hospital.  On chart review the patient has been admitted twice. First on 11/30/2014 through 12/04/2014. Discharge diagnoses included syncope and questionable colitis, it was noted that the patient's major concern was his vision. Abdominal CT showed new liver lesions c/w metastatic disease. Second admission was immediately after discharge on 12/04/2014 from a second episode of syncope. He was treated for a UTI.  He doesn't know when he sees Dr. Isidore Moos again; "my brother takes  care of all of that."   He remarks that he's trying to give it all to the [facility].   MEDICAL HISTORY:  Past Medical History  Diagnosis Date  . Hypertension   . Abnormal MRI, spine 05/12/2011  . Nausea 01/2014  . BPH (benign prostatic hypertrophy)   . Hx of adenomatous colonic polyps     h/o tubulovillous adenomas  . Diverticulitis   . Lung mass     Right Upper Lobe  . GERD (gastroesophageal reflux disease)   . Blurred vision   . Brain metastasis ([place])   . Brain cancer ([place])   . Lung cancer [facility])     SURGICAL HISTORY: Past Surgical History  Procedure Laterality Date  . Cholecystectomy  2009  . Polyp removal via colonoscopy    . Colonoscopy with esophagogastroduodenoscopy (egd) N/A 04/06/2013    PNT:[id number] size internal hemorrhoids/moderate diverticulosis/medium sized HH  . Tonsilectomy, adenoidectomy, bilateral myringotomy and tubes    . Video bronchoscopy with endobronchial ultrasound N/A 10/29/2014    Procedure: ATTEMPTED VIDEO BRONCHOSCOPY WITH ENDOBRONCHIAL ULTRASOUND;  Surgeon: Javier Glazier, MD;  Location: [place];  Service: Thoracic;  Laterality: N/A;  . Mediastinoscopy N/A 11/07/2014    Procedure: MEDIASTINOSCOPY;  Surgeon: Melrose Nakayama, MD;  Location: [place];  Service: Thoracic;  Laterality: N/A;    SOCIAL HISTORY: Social History   Social History  . Marital Status: Single    Spouse Name: N/A  . Number of Children: 0  . Years of Education: N/A  Occupational History  . retired    Social History Main Topics  . Smoking status: Former Smoker -- 0.50 packs/day for 40 years    Types: Cigarettes    Quit date: 10/17/2009  . Smokeless tobacco: Never Used  . Alcohol Use: No     Comment: Remote EtOH  . Drug Use: No  . Sexual Activity: Not on file   Other Topics Concern  . Not on file   Social History Narrative   Originally from [place]. Previously worked as a Training and development officer at [employer]. He worked in the [employer]. He previously worked Geographical information systems officer for [employer] Has no pets currently. No mold exposure. No asbestos exposure.    Single, never married. No children. Lives in [place]. Ex smoker, began at an early age. Quit for over 2 years. ETOH, previously drank "not a whole lot". He worked at [employer] for 35 years.  FAMILY HISTORY: Family History  Problem Relation Age of Onset  . Diabetes Father   . Diabetes Sister   . Lung cancer Brother   . CVA Brother   . Colon cancer Neg Hx   . CAD Brother   . Heart attack Neg Hx   . Hypertension Brother   . Stroke Brother    indicated that his mother is deceased. He indicated that his father is deceased. He indicated that his sister is deceased. He indicated that both of his brothers are alive.  Mother died at 14 yo. Alzheimer's. Father died at 24 yo. He fell and hurt his head. 3 brothers. 1 sister. Sister is deceased. She had diabetes.  ALLERGIES:  is allergic to reglan.  MEDICATIONS:  Current Outpatient Prescriptions  Medication Sig Dispense Refill  . PEMEtrexed (ALIMTA) 500 MG injection Inject into the vein every 21 ( twenty-one) days. To start 12/06/14    . dexamethasone (DECADRON) 4 MG tablet Start taking 1 tablet ('4mg'$ ) twice daily with food. On Oct 7, continue to taper as instructed. (Patient taking differently: Take 4 mg by mouth daily. continue to taper as instructed.) 010 tablet 0  . folic acid (FOLVITE) 1 MG tablet Take 1 tablet by mouth daily and for 21 days after the completion of Pemetrexed chemo. 30 tablet 5  . LORazepam (ATIVAN) 1 MG tablet Take 1 mg by mouth 2 (two) times daily as needed for anxiety.     . metoprolol tartrate (LOPRESSOR) 25 MG tablet Take 1 tablet (25 mg total) by mouth 2 (two) times daily. 60 tablet 1  . omeprazole (PRILOSEC) 20 MG capsule 1 PO 30 MINS PRIOR TO BREAKFAST AND AT BEDTIME 180 capsule 3  . ondansetron (ZOFRAN ODT) 4 MG disintegrating tablet Take 1 tablet (4 mg total) by mouth every 6 (six) hours as needed for nausea. (Patient  taking differently: Take 4 mg by mouth at bedtime. ) 30 tablet 3  . sulfamethoxazole-trimethoprim (BACTRIM DS,SEPTRA DS) 800-160 MG tablet Take 1 tablet by mouth 2 (two) times daily. 20 tablet 0   No current facility-administered medications for this visit.   Facility-Administered Medications Ordered in Other Visits  Medication Dose Route Frequency Provider Last Rate Last Dose  . PEMEtrexed (ALIMTA) 875 mg in sodium chloride 0.9 % 100 mL chemo infusion  375 mg/m2 (Treatment Plan Actual) Intravenous Once Patrici Ranks, MD      . sodium chloride 0.9 % injection 10 mL  10 mL Intracatheter PRN Patrici Ranks, MD        Review of Systems  Constitutional: Weight  is stable.. Negative for fever, chills, malaise/fatigue and diaphoresis.  HENT: Negative for congestion, ear discharge, ear pain, hearing loss, nosebleeds, sore throat and tinnitus.   Eyes: Positive for blurred vision and double vision. Negative for photophobia and pain.       Visual changes are mostly blurry vision, with very notable double vision. Respiratory: Negative.  Negative for stridor.   Cardiovascular: Negative.   Gastrointestinal: Negative.   Genitourinary: Positive for frequency.       Nocturia , uses a urinal Musculoskeletal: Positive for joint pain. Negative for myalgias, back pain, falls and neck pain.  Skin: Negative.   Neurological: Negative for dizziness, tingling, tremors, sensory change, speech change, focal weakness, seizures, loss of consciousness, weakness and headaches.  Endo/Heme/Allergies: Negative.   Psychiatric/Behavioral: Negative for depression, suicidal ideas and memory loss. The patient is nervous/anxious. The patient does not have insomnia.   All other systems reviewed and are negative.  14 point ROS was done and is otherwise as detailed above or in HPI   PHYSICAL EXAMINATION: ECOG PERFORMANCE STATUS: 2 - Symptomatic, <50% confined to bed  Filed Vitals:   12/06/14 0923  BP: 116/67  Pulse:  87  Temp: 97.7 F (36.5 C)  Resp: 20   Filed Weights   12/06/14 0923  Weight: 180 lb (81.647 kg)    Physical Exam  Constitutional: He is oriented to person, place,  and well-developed, well-nourished confused at times Head: Normocephalic and atraumatic.  Nose: Nose normal.  Mouth/Throat: Oropharynx is clear and moist. Eyes: Conjunctivae and EOM are normal. Patient wearing glasses, pupils are reactive Neck: Normal range of motion. Neck supple. No tracheal deviation present. No thyromegaly present.  Cardiovascular: Normal rate, regular rhythm and normal heart sounds.   No murmur heard. Pulmonary/Chest: Effort normal and breath sounds normal. No respiratory distress. He has no wheezes. He has no rales. He exhibits no tenderness.  Abdominal: Soft. Bowel sounds are normal. He exhibits no distension and no mass. There is no tenderness. There is no rebound and no guarding.  Musculoskeletal: Normal range of motion. He exhibits no edema or tenderness.  Lymphadenopathy:    He has no cervical adenopathy.  Neurological: He is alert and oriented to person, place He displays normal reflexes. No cranial nerve deficit. He exhibits normal muscle tone.  Skin: Skin is warm and dry. No rash noted. No erythema. No pallor.  Psychiatric: Mood and affect normal.  Nursing note and vitals reviewed.   LABORATORY DATA:  I have reviewed the data as listed Lab Results  Component Value Date   WBC 8.6 12/04/2014   HGB 13.3 12/04/2014   HCT 39.5 12/04/2014   MCV 71.9* 12/04/2014   PLT 89* 12/04/2014   CMP     Component Value Date/Time   NA 135 12/04/2014 1157   K 3.7 12/04/2014 1157   CL 105 12/04/2014 1157   CO2 23 12/04/2014 1157   GLUCOSE 165* 12/04/2014 1157   BUN 10 12/04/2014 1157   CREATININE 0.97 12/04/2014 1157   CREATININE 1.00 02/21/2014 1323   CALCIUM 8.6* 12/04/2014 1157   PROT 4.9* 12/04/2014 1157   ALBUMIN 2.5* 12/04/2014 1157   AST 31 12/04/2014 1157   ALT 40 12/04/2014 1157    ALKPHOS 44 12/04/2014 1157   BILITOT 0.3 12/04/2014 1157   GFRNONAA >60 12/04/2014 1157   GFRAA >60 12/04/2014 1157    RADIOGRAPHIC STUDIES: I have personally reviewed the radiological images as listed and agreed with the findings in the report.  Study Result  CLINICAL DATA: Brain metastasis. SRS protocol for treatment planning.  EXAM: MRI HEAD WITHOUT AND WITH CONTRAST  TECHNIQUE: Multiplanar, multiecho pulse sequences of the brain and surrounding structures were obtained without and with intravenous contrast.  CONTRAST: 18m MULTIHANCE GADOBENATE DIMEGLUMINE 529 MG/ML IV SOLN  COMPARISON: 10/07/2014  FINDINGS: Calvarium and upper cervical spine: No focal marrow signal abnormality.  Orbits: No evidence of metastasis.  Sinuses and Mastoids: Mucosal thickening with bilateral maxillary sinus secretion retention. Mucous layers within the nasopharynx. Mastoid and middle ears are clear.  Brain: Solitary mass in the right midbrain, between the cerebral aqua duct and cerebral peduncle, 4 mm larger than previous at 16 mm diameter. There is associated increase in cerebral aqua duct mass effect, but no change ventricular volume to suggest obstructive hydrocephalus. No periventricular edema. Mild vasogenic edema around the mass is stable. No other evidence of intracranial disease.  Mild for age chronic small vessel ischemia in the bilateral cerebral white matter. Generalized cerebral volume loss. No acute infarct, acute hemorrhage, or major vessel occlusion.  IMPRESSION: 16 mm solitary metastasis in the right midbrain has grown by 4 mm since 2 weeks ago. Increased mass effect on the cerebral aqua duct but stable ventricular volume.   Electronically Signed  By: JMonte FantasiaM.D.  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 I have dose reduced it slightly secondary to his coexisting thrombocytopenia. Even though doublet therapy is shown to be beneficial in elderly patients, I am very hesitant to give him a platinum based therapy because of his limitations. End-of-life discussions are very difficult to have with the patient. His brother is not here today and he and I have addressed this. Unfortunately the patient does not have an official DO NOT RESUSCITATE status. We will address this at follow-up again.  I think the patient would benefit more from palliative care/hospice in regards to quality of life and I will continue to have these discussions moving forward. He will need to be on Xgeva and we can address this at his follow-up as well. I would like for his brother to be here as the patient clearly does not understand.  He is very frustrated about his eyesight. I have mentioned to him multiple times a do not believe we will be able to improve it. I have recommended another head MRI and we can get this ordered. He has recently had a head CT that was unremarkable.  All questions were answered. The patient knows to call the clinic with any problems, questions or concerns.    This document serves as a record of services personally performed by SAncil Linsey MD. It was created on her behalf by KToni Amend a trained medical scribe. The creation of this record is based on the scribe's personal observations and the provider's statements to them. This document has been checked and approved by the attending provider.  I have reviewed the above documentation for accuracy and completeness, and I agree with the above.  SKelby Fam PWhitney Muse MD

## 2014-12-10 ENCOUNTER — Ambulatory Visit (HOSPITAL_COMMUNITY)
Admission: RE | Admit: 2014-12-10 | Discharge: 2014-12-10 | Disposition: A | Discharge status: Home / Self Care | Payer: Commercial Managed Care - HMO | Source: Ambulatory Visit | Attending: Hematology & Oncology | Admitting: Hematology & Oncology

## 2014-12-10 DIAGNOSIS — Z923 Personal history of irradiation: Secondary | ICD-10-CM | POA: Diagnosis not present

## 2014-12-10 DIAGNOSIS — C7931 Secondary malignant neoplasm of brain: Secondary | ICD-10-CM | POA: Diagnosis not present

## 2014-12-10 DIAGNOSIS — H538 Other visual disturbances: Secondary | ICD-10-CM | POA: Insufficient documentation

## 2014-12-10 DIAGNOSIS — C3490 Malignant neoplasm of unspecified part of unspecified bronchus or lung: Secondary | ICD-10-CM | POA: Diagnosis present

## 2014-12-10 MED ORDER — GADOBENATE DIMEGLUMINE 529 MG/ML IV SOLN
14.0000 mL | Freq: Once | INTRAVENOUS | Status: AC | PRN
  Administered 2014-12-10: 14 mL via INTRAVENOUS

## 2014-12-11 NOTE — Assessment & Plan Note (Addendum)
Stage IV NSCLC, adenocarcinoma with brain metastases, S/P XRT by Dr. Isidore Moos.  Began systemic chemotherapy with single-agent Alimta.  Oncology history developed.  Staging completed.  No labs today  Patient is failing to thrive and having more difficulty at home.  He is seen by nursing requiring maximum assistance to use his portable urinal.  He is not participating in questioning today.  We discussed Hospice intervention and changing our focus on comfort.  The patient brother report, "I figured this was coming today."  He admits that Jared Gill is not doing well at home.  He is agreeable to Hospice intervention.  Order is placed for Hospice at home.  The patient's brother does not want him going to the Hospice home if possible.  He does have 24 hr care at home currently.  We discussed code status and he is a DNR.  Rx escribed for oral candidiasis secondary to Dexamethasone.  Rxs including Diflucan and Magic Mouthwash with Lidocaine.  This was sent to [facility].  Refer to Hospice at home with [facility].  Patient's treatment plan, orders, and episodes of care are discontinued.

## 2014-12-11 NOTE — Progress Notes (Signed)
Jared Gill, [place] [address]  Non-small cell carcinoma of lung [facility]) - Plan: DNR (Do Not Resuscitate)  Oral candida - Plan: Diphenhyd-Hydrocort-Nystatin (FIRST-DUKES MOUTHWASH) SUSP, fluconazole (DIFLUCAN) 100 MG tablet  DNR (do not resuscitate)  CURRENT THERAPY: S/P single-agent Pemetrexed at a reduced dose.  INTERVAL HISTORY: Jared Gill 74 y.o. male returns for followup of Stage IV NSCLC, adenocarcinoma with brain metastases, S/P XRT by Dr. Isidore Moos.    Brain metastasis ([place])   10/07/2014 Imaging MRI brain- 12 mm ring-enhancing lesion in the right midbrain/cerebral peduncle with surrounding edema. 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 The differential diagnosis does include primary brain tumor and abscess, but those are less likely. [place]   10/23/2014 Imaging MRI brain- 16 mm solitary metastasis in the right midbrain has grown by 4 mm since 2 weeks ago. Increased mass effect on the cerebral aqua duct but stable ventricular volume.   10/25/2014 - 11/01/2014 Radiation Therapy SRS by Dr. Isidore Moos.   12/10/2014 Imaging MRI brain- Decreased size of right mid brain metastasis with resolution of edema. No evidence of new intracranial metastases.  Suspected subacute subcentimeter white matter infarct in the left frontal lobe.    Non-small cell carcinoma of lung ([place])   10/10/2014 Imaging CT CAP- There is a right upper lobe/right mediastinal mass measures at least 2.5 x 3 x 4 cm. This is highly suspicious for primary malignancy. Pathologic lymph nodes are noted precarinal region and right hilum. Nodular suprahilar lesion RUL...   10/21/2014 PET scan 3.3 cm medial right upper lobe mass with two central RUL nodules measuring up to 2.0 cm, suspicious for primary bronchogenic neoplasm with a pancoast tumor. Associated right supraclavicular, mediastinal, and right hilar nodal metastases...   11/07/2014 Procedure Mediastinoscopy by Dr. Roxan Hockey   11/07/2014  Pathology Results Lymph node, biopsy, 4R #2 - METASTATIC NON-SMALL CELL CARCINOMA. 2. Lymph node, biopsy, 4R - METASTATIC NON-SMALL CELL CARCINOMA. 3. Lymph node, biopsy, 7 - ANTHRACOTIC LYMPH NODE. - NO TUMOR IDENTIFIED. 4. Lymph node, biopsy, 4R #3 - METASTATIC NO   11/07/2014 Pathology Results [facility]- alterations identified: [id number], RET amplification, NFE2L2 E63Q, TP53 H179L.   11/30/2014 - 12/04/2014 Hospital Admission Syncope and possible colitis   11/30/2014 Imaging CT angio Abd- No evidence of large vessel mesenteric ischemia.   12/04/2014 - 12/05/2014 Hospital Admission Syncope- thought to be a vasovagal.   12/06/2014 -  Chemotherapy Alimta single agent.  First cycle dose reduced to evaluate tolerability.     I personally reviewed and went over laboratory results with the patient.  The results are noted within this dictation.  We will update labs today.  I personally reviewed and went over radiographic studies with the patient.  The results are noted within this dictation.    MRI brain is reviewed and report notes improvement in brain metastasis, but with a subacute stroke in L frontal lobe.  He is failing to thrive at home and not improving.  With this being said, I have broached the subject of Hospice.  We had a long conversation regarding Hospice vs Chemotherapy.  The patient is aware that chemotherapy may adversely affect quality of life and may not prove to be beneficial.  Patient education was given regarding Hospice and the services they provide.  The patient understands that studies report that early enrollment in Hospice actually allows the patient to live longer compared to those who enroll in Hospice nearer to end of life.  Hospice will  allow the patient to stay at home at end of life or go to a facility for end of life care.  At this point, the patient would like to remain at home.  Hospice provides the patient with a team of providers to help with care including  physicians, nurses, aids, chaplains, and social workers.  Hospice's goal is to controlling symptoms with medications.  The patient is certainly Hospice appropriate with a life expectancy of less than 6 months, closer to days- weeks.   We discussed Code Status.  He is a DNR as of today.  Past Medical History  Diagnosis Date  . Hypertension   . Abnormal MRI, spine 05/12/2011  . Nausea 01/2014  . BPH (benign prostatic hypertrophy)   . Hx of adenomatous colonic polyps     h/o tubulovillous adenomas  . Diverticulitis   . Lung mass     Right Upper Lobe  . GERD (gastroesophageal reflux disease)   . Blurred vision   . Brain metastasis ([place])   . Brain cancer ([place])   . Lung cancer ([place])   . DNR (do not resuscitate) 12/12/2014    has Anxiety state; Essential hypertension; GERD; COLONIC POLYPS, HX OF; Abnormal MRI, spine; Lipoma of axilla; Unintentional weight loss of more than 10 pounds; Gastroparesis; Microcytic anemia; Constipation; Brain metastasis ([place]); Lung mass; Non-small cell carcinoma of lung ([place]); Atrial fibrillation ([place]); Postoperative atrial fibrillation ([place]); Visual impairment; Weight loss; Lung cancer ([place]); Syncope; Infectious colitis; UTI (urinary tract infection); Proctitis; Thrombocytopenia ([place]); and DNR (do not resuscitate) on his problem list.     is allergic to reglan.  Current Outpatient Prescriptions on File Prior to Visit  Medication Sig Dispense Refill  . dexamethasone (DECADRON) 4 MG tablet Start taking 1 tablet (81m) twice daily with food. On Oct 7, continue to taper as instructed. (Patient taking differently: Take 4 mg by mouth daily. continue to taper as instructed.) 1709tablet 0  . folic acid (FOLVITE) 1 MG tablet Take 1 tablet by mouth daily and for 21 days after the completion of Pemetrexed chemo. 30 tablet 5  . LORazepam (ATIVAN) 1 MG tablet Take 1 mg by mouth 2 (two) times daily as needed for anxiety.     . metoprolol tartrate (LOPRESSOR) 25 MG tablet Take 1  tablet (25 mg total) by mouth 2 (two) times daily. 60 tablet 1  . omeprazole (PRILOSEC) 20 MG capsule 1 PO 30 MINS PRIOR TO BREAKFAST AND AT BEDTIME 180 capsule 3  . ondansetron (ZOFRAN ODT) 4 MG disintegrating tablet Take 1 tablet (4 mg total) by mouth every 6 (six) hours as needed for nausea. (Patient taking differently: Take 4 mg by mouth at bedtime. ) 30 tablet 3  . PEMEtrexed (ALIMTA) 500 MG injection Inject into the vein every 21 ( twenty-one) days. To start 12/06/14    . sulfamethoxazole-trimethoprim (BACTRIM DS,SEPTRA DS) 800-160 MG tablet Take 1 tablet by mouth 2 (two) times daily. (Patient not taking: Reported on 12/12/2014) 20 tablet 0   No current facility-administered medications on file prior to visit.    Past Surgical History  Procedure Laterality Date  . Cholecystectomy  2009  . Polyp removal via colonoscopy    . Colonoscopy with esophagogastroduodenoscopy (egd) N/A 04/06/2013    SGGE:ZMOQHUTMsize internal hemorrhoids/moderate diverticulosis/medium sized HH  . Tonsilectomy, adenoidectomy, bilateral myringotomy and tubes    . Video bronchoscopy with endobronchial ultrasound N/A 10/29/2014    Procedure: ATTEMPTED VIDEO BRONCHOSCOPY WITH ENDOBRONCHIAL ULTRASOUND;  Surgeon: JJavier Glazier MD;  Location: [facility];  Service: Thoracic;  Laterality: N/A;  . Mediastinoscopy N/A 11/07/2014    Procedure: MEDIASTINOSCOPY;  Surgeon: Melrose Nakayama, MD;  Location: [facility];  Service: Thoracic;  Laterality: N/A;    Patient does not participate in ROS questioning.   PHYSICAL EXAMINATION  ECOG PERFORMANCE STATUS: 3 - Symptomatic, >50% confined to bed  Filed Vitals:   12/12/14 0922  BP: 97/61  Pulse: 86  Temp: 97.7 F (36.5 C)  Resp: 18    GENERAL:ill looking and accompanied by brother, not participating in discussion. SKIN: skin color, texture, turgor are normal, no rashes or significant lesions HEAD: Normocephalic EYES: Conjunctiva are pink and non-injected EARS: External  ears normal OROPHARYNX:marked erythema and thrush  NECK: trachea midline LYMPH:  not examined BREAST:not examined LUNGS: not examined HEART: not examined ABDOMEN:not examined BACK: not examined EXTREMITIES:not examined  NEURO: positive findings: confused    LABORATORY DATA: CBC    Component Value Date/Time   WBC 11.4* 12/06/2014 0945   RBC 5.32 12/06/2014 0945   HGB 13.1 12/06/2014 0945   HCT 37.8* 12/06/2014 0945   PLT 111* 12/06/2014 0945   MCV 71.1* 12/06/2014 0945   MCH 24.6* 12/06/2014 0945   MCHC 34.7 12/06/2014 0945   RDW 18.6* 12/06/2014 0945   LYMPHSABS 0.4* 12/06/2014 0945   MONOABS 0.6 12/06/2014 0945   EOSABS 0.0 12/06/2014 0945   BASOSABS 0.0 12/06/2014 0945      Chemistry      Component Value Date/Time   NA 135 12/06/2014 0945   K 3.7 12/06/2014 0945   CL 104 12/06/2014 0945   CO2 21* 12/06/2014 0945   BUN 12 12/06/2014 0945   CREATININE 0.81 12/06/2014 0945   CREATININE 1.00 02/21/2014 1323      Component Value Date/Time   CALCIUM 8.4* 12/06/2014 0945   ALKPHOS 48 12/06/2014 0945   AST 36 12/06/2014 0945   ALT 43 12/06/2014 0945   BILITOT 0.5 12/06/2014 0945        PENDING LABS:   RADIOGRAPHIC STUDIES:  Ct Head Wo Contrast  11/30/2014  CLINICAL DATA:  Syncope and fall off toilet this morning. Positive loss of consciousness. EXAM: CT HEAD WITHOUT CONTRAST CT CERVICAL SPINE WITHOUT CONTRAST TECHNIQUE: Multidetector CT imaging of the head and cervical spine was performed following the standard protocol without intravenous contrast. Multiplanar CT image reconstructions of the cervical spine were also generated. COMPARISON:  None. FINDINGS: CT HEAD FINDINGS Bony calvarium appears intact. Minimal bilateral maxillary sinusitis is noted. Mild diffuse cortical atrophy is noted. No mass effect or midline shift is noted. Ventricular size is within normal limits. There is no evidence of mass lesion, hemorrhage or acute infarction. CT CERVICAL SPINE  FINDINGS No fracture is noted. Minimal grade 1 retrolisthesis is noted at C4-5 and C5-6 secondary to degenerative disc disease at these levels. Anterior osteophyte formation is noted at these levels as well. Severe degenerative disc disease is noted at C6-7. No significant abnormality seen involving the visualized lung apices. IMPRESSION: Minimal bilateral maxillary sinusitis. Mild diffuse cortical atrophy. No acute intracranial abnormality seen. Severe multilevel degenerative disc disease is noted. No acute abnormality seen in the cervical spine. Electronically Signed   By: Marijo Conception, M.D.   On: 11/30/2014 14:42   Ct Cervical Spine Wo Contrast  11/30/2014  CLINICAL DATA:  Syncope and fall off toilet this morning. Positive loss of consciousness. EXAM: CT HEAD WITHOUT CONTRAST CT CERVICAL SPINE WITHOUT CONTRAST TECHNIQUE: Multidetector CT imaging of the head and cervical  spine was performed following the standard protocol without intravenous contrast. Multiplanar CT image reconstructions of the cervical spine were also generated. COMPARISON:  None. FINDINGS: CT HEAD FINDINGS Bony calvarium appears intact. Minimal bilateral maxillary sinusitis is noted. Mild diffuse cortical atrophy is noted. No mass effect or midline shift is noted. Ventricular size is within normal limits. There is no evidence of mass lesion, hemorrhage or acute infarction. CT CERVICAL SPINE FINDINGS No fracture is noted. Minimal grade 1 retrolisthesis is noted at C4-5 and C5-6 secondary to degenerative disc disease at these levels. Anterior osteophyte formation is noted at these levels as well. Severe degenerative disc disease is noted at C6-7. No significant abnormality seen involving the visualized lung apices. IMPRESSION: Minimal bilateral maxillary sinusitis. Mild diffuse cortical atrophy. No acute intracranial abnormality seen. Severe multilevel degenerative disc disease is noted. No acute abnormality seen in the cervical spine.  Electronically Signed   By: Marijo Conception, M.D.   On: 11/30/2014 14:42   Mr Jeri Cos CN Contrast  12/10/2014  CLINICAL DATA:  Worsening visual changes. Stage IV non-small cell lung cancer with brain metastases. Stereotactic radiosurgery in 10/2014. EXAM: MRI HEAD WITHOUT AND WITH CONTRAST TECHNIQUE: Multiplanar, multiecho pulse sequences of the brain and surrounding structures were obtained without and with intravenous contrast. CONTRAST:  47m MULTIHANCE GADOBENATE DIMEGLUMINE 529 MG/ML IV SOLN COMPARISON:  10/23/2014 brain MRI.  11/30/2014 head CT. FINDINGS: There is a 6 mm focus of moderately increased diffusion-weighted signal in the subcortical white matter of the posterior left frontal lobe with apparent normal diffusion on the ADC map and associated T2 hyperintensity, new from the prior MRI and possibly representing a small subacute infarct. No acute infarct, midline shift, or extra-axial fluid collection is seen. Cerebral atrophy is unchanged. Small foci of T2 hyperintensity in the cerebral white matter bilaterally are nonspecific but compatible with mild chronic small vessel ischemic disease. Ring-enhancing mass in the right midbrain adjacent to the cerebral aqueduct has decreased in size, now measuring 9 mm (previously 16 mm). There is likely a small amount of associated blood products. Surrounding edema on the prior MRI has resolved. No new enhancing brain lesions are identified. Orbits are unremarkable. Fluid is present in both maxillary sinuses, similar to the prior MRI. Mastoid air cells are clear. The mucous is again noted in the nasopharynx. Major intracranial vascular flow voids are preserved. IMPRESSION: 1. Decreased size of right mid brain metastasis with resolution of edema. 2. No evidence of new intracranial metastases. 3. Suspected subacute subcentimeter white matter infarct in the left frontal lobe. Electronically Signed   By: ALogan BoresM.D.   On: 12/10/2014 16:59   Dg Abd Acute  W/chest  11/30/2014  CLINICAL DATA:  Loss of bowel control.  Mental status changes. EXAM: DG ABDOMEN ACUTE W/ 1V CHEST COMPARISON:  11/07/2014 FINDINGS: There is a moderate amount of gas within small and large bowel but the pattern does not suggest ileus or obstruction. No sign of free air. There is interposition of colon between the liver and the diaphragm. Clips in the right upper quadrant consistent with previous cholecystectomy. Vascular calcification. One-view chest shows normal heart size. There is mild volume loss in the left lower lobe. There are made of the chest is clear. No free air seen. IMPRESSION: No acute intra abdominal finding. Gas within small and large bowel but no evidence of ileus, obstruction or free air. Probable mild atelectasis in the left lower lobe. Electronically Signed   By: MJan FiremanD.  On: 11/30/2014 14:38   Ct Angio Abd/pel W/ And/or W/o  11/30/2014  CLINICAL DATA:  74 year old with recent diagnosis of metastatic lung cancer involving the right upper lobe. Patient presents with an episode of syncope while on the toilet earlier today and has a markedly elevated serum lactic acid. Concern for mesenteric ischemia. EXAM: CTA ABDOMEN AND PELVIS WITHOUT CONTRAST TECHNIQUE: Multidetector CT imaging of the abdomen and pelvis was performed using the standard protocol during bolus administration of intravenous contrast. Multiplanar reconstructed images and MIPs were obtained and reviewed to evaluate the vascular anatomy. CONTRAST:  134m OMNIPAQUE IOHEXOL 350 MG/ML IV. COMPARISON:  PET-CT 10/21/2014. CT abdomen and pelvis 10/10/2014 dating back to 04/14/2013. FINDINGS: Celiac artery patent with mild origin stenosis. SMA widely patent, and no branch stenoses or occlusions are visible. 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 Single bilateral renal arteries with  probable high-grade stenosis involving the origin of the left renal artery Since the most recent prior CT 10/10/2014, interval increase in size of a posterior segment right lobe liver metastasis now measuring 1.7 cm (series 8, image 28). New vague approximate 0.8 cm anterior segment right lobe liver metastasis (image 23). Normal-appearing spleen and adrenal glands. Atrophy involving the pancreatic body and tail without focal pancreatic parenchymal abnormality. Gallbladder surgically absent. No biliary ductal dilation. Exophytic simple cyst arising from the lower pole of the right kidney as noted previously; no significant abnormality involving either kidney. Since the 10/10/2014 CT, development of presacral edema and edema surrounding the upper and mid rectum with hyperemia involving the wall of this segment of the rectum which measures approximately 12 cm in length. This segment of the rectum is of diminished caliber relative to the lower rectum. No associated wall thickening. Descending and sigmoid colon diverticulosis without evidence of acute diverticulitis. No evidence of colonic obstruction. Stomach normal in appearance for degree of distention. Normal-appearing small bowel. Cecum positioned in the right upper quadrant, and normal-appearing gas-filled appendix identified in the mid abdomen extending almost to midline. No ascites. Urinary bladder unremarkable. Prostate gland and seminal vesicles normal for age. Six numerous pelvic phleboliths. Degenerative disc disease and spondylosis involving the visualized lower thoracic and entire lumbar spine. Visualized lung bases clear apart from scarring in the lingula, right middle lobe and both lower lobes. Heart size upper normal. Review of the MIP images confirms the above findings. IMPRESSION: 1. No evidence of large vessel mesenteric ischemia. 2. Approximate 12 cm segment of the upper and mid rectum demonstrating diminished caliber relative to the lower rectum,  hyperemia involving its mucosa and wall without associated wall thickening, and edema/inflammation in the surrounding fat, including the presacral space. This is a new finding since 09/2014 and likely indicates focal infectious colitis versus focal ischemic colitis. 3. Interval increase in size of a posterior segment right lobe liver metastasis and development of a possible new anterior segment right lobe metastasis since 09/2014. Electronically Signed   By: TEvangeline DakinM.D.   On: 11/30/2014 17:26     PATHOLOGY:    ASSESSMENT AND PLAN:  Non-small cell carcinoma of lung (HCC) Stage IV NSCLC, adenocarcinoma with brain metastases, S/P XRT by Dr. SIsidore Moos  Began systemic chemotherapy with single-agent Alimta.  Oncology history developed.  Staging completed.  No labs today  Patient is failing to thrive and having more difficulty at home.  He is seen by nursing requiring maximum assistance to use his  portable urinal.  He is not participating in questioning today.  We discussed Hospice intervention and changing our focus on comfort.  The patient brother report, "I figured this was coming today."  He admits that Mr. Swiney is not doing well at home.  He is agreeable to Hospice intervention.  Order is placed for Hospice at home.  The patient's brother does not want him going to the Hospice home if possible.  He does have 24 hr care at home currently.  We discussed code status and he is a DNR.  Rx escribed for oral candidiasis secondary to Dexamethasone.  Rxs including Diflucan and Magic Mouthwash with Lidocaine.  This was sent to [facility].  Refer to Hospice at home with [facility].  Patient's treatment plan, orders, and episodes of care are discontinued.    THERAPY PLAN:  Discontinue.  Refer to hospice.  All questions were answered. The patient knows to call the clinic with any problems, questions or concerns. We can certainly see the patient much sooner if  necessary.  Patient and plan discussed with Dr. Ancil Linsey and she is in agreement with the aforementioned.   This note is electronically signed by: Robynn Pane, PA-C 12/12/2014 10:07 AM

## 2014-12-12 ENCOUNTER — Encounter (HOSPITAL_BASED_OUTPATIENT_CLINIC_OR_DEPARTMENT_OTHER): Payer: Commercial Managed Care - HMO | Admitting: Oncology

## 2014-12-12 ENCOUNTER — Encounter (HOSPITAL_COMMUNITY): Payer: Self-pay | Admitting: Lab

## 2014-12-12 ENCOUNTER — Encounter (HOSPITAL_COMMUNITY): Payer: Self-pay | Admitting: Oncology

## 2014-12-12 VITALS — BP 97/61 | HR 86 | Temp 97.7°F | Resp 18 | Wt 171.7 lb

## 2014-12-12 DIAGNOSIS — B370 Candidal stomatitis: Secondary | ICD-10-CM

## 2014-12-12 DIAGNOSIS — Z66 Do not resuscitate: Secondary | ICD-10-CM

## 2014-12-12 DIAGNOSIS — C3491 Malignant neoplasm of unspecified part of right bronchus or lung: Secondary | ICD-10-CM

## 2014-12-12 DIAGNOSIS — C7951 Secondary malignant neoplasm of bone: Secondary | ICD-10-CM

## 2014-12-12 DIAGNOSIS — C3490 Malignant neoplasm of unspecified part of unspecified bronchus or lung: Secondary | ICD-10-CM

## 2014-12-12 HISTORY — DX: Do not resuscitate: Z66

## 2014-12-12 MED ORDER — FIRST-DUKES MOUTHWASH MT SUSP: 5.0000 mL | Freq: Four times a day (QID) | OROMUCOSAL | Status: DC | PRN

## 2014-12-12 MED ORDER — FLUCONAZOLE 100 MG PO TABS: 100.0000 mg | ORAL_TABLET | Freq: Every day | ORAL | Status: DC

## 2014-12-12 NOTE — Progress Notes (Signed)
Referral to Hospice.  Records faxed on 10/20

## 2014-12-12 NOTE — Patient Instructions (Signed)
..  [facility] [place] [facility] Discharge Instructions  RECOMMENDATIONS MADE BY THE CONSULTANT AND ANY TEST RESULTS WILL BE SENT TO YOUR REFERRING PHYSICIAN.  Prescriptions for diflucan (a pill) and a mouthwash for the thrush in your mouth Discussed hospice referral and DNR status  DNR form given to patient for bedside at home   Thank you for choosing [place] at [facility] to provide your oncology and hematology care.  To afford each patient quality time with our provider, please arrive at least 15 minutes before your scheduled appointment time.    You need to re-schedule your appointment should you arrive 10 or more minutes late.  We strive to give you quality time with our providers, and arriving late affects you and other patients whose appointments are after yours.  Also, if you no show three or more times for appointments you may be dismissed from the clinic at the providers discretion.     Again, thank you for choosing [facility].  Our hope is that these requests will decrease the amount of time that you wait before being seen by our physicians.       _____________________________________________________________  Should you have questions after your visit to [facility], please contact our office at [phone] between the hours of 8:30 a.m. and 4:30 p.m.  Voicemails left after 4:30 p.m. will not be returned until the following business day.  For prescription refill requests, have your pharmacy contact our office.

## 2014-12-13 ENCOUNTER — Ambulatory Visit (HOSPITAL_COMMUNITY): Payer: Commercial Managed Care - HMO

## 2014-12-13 ENCOUNTER — Other Ambulatory Visit (HOSPITAL_COMMUNITY): Payer: Self-pay

## 2014-12-25 ENCOUNTER — Other Ambulatory Visit (HOSPITAL_COMMUNITY)
Admission: RE | Admit: 2014-12-25 | Discharge: 2014-12-25 | Disposition: A | Discharge status: Home / Self Care | Payer: Commercial Managed Care - HMO | Source: Ambulatory Visit | Attending: Oncology | Admitting: Oncology

## 2014-12-25 DIAGNOSIS — C3490 Malignant neoplasm of unspecified part of unspecified bronchus or lung: Secondary | ICD-10-CM | POA: Diagnosis present

## 2014-12-27 ENCOUNTER — Ambulatory Visit (HOSPITAL_COMMUNITY): Payer: Self-pay | Admitting: Oncology

## 2014-12-27 ENCOUNTER — Inpatient Hospital Stay (HOSPITAL_COMMUNITY): Payer: Self-pay

## 2014-12-30 ENCOUNTER — Encounter (HOSPITAL_COMMUNITY): Payer: Self-pay

## 2015-01-11 ENCOUNTER — Emergency Department (HOSPITAL_COMMUNITY)

## 2015-01-11 ENCOUNTER — Encounter (HOSPITAL_COMMUNITY): Payer: Self-pay | Admitting: Emergency Medicine

## 2015-01-11 ENCOUNTER — Inpatient Hospital Stay (HOSPITAL_COMMUNITY)
Admission: EM | Admit: 2015-01-11 | Discharge: 2015-01-18 | DRG: 871 | Disposition: A | Discharge status: Hospice | Attending: Internal Medicine | Admitting: Internal Medicine

## 2015-01-11 DIAGNOSIS — J189 Pneumonia, unspecified organism: Secondary | ICD-10-CM | POA: Diagnosis present

## 2015-01-11 DIAGNOSIS — C787 Secondary malignant neoplasm of liver and intrahepatic bile duct: Secondary | ICD-10-CM | POA: Diagnosis present

## 2015-01-11 DIAGNOSIS — Z923 Personal history of irradiation: Secondary | ICD-10-CM

## 2015-01-11 DIAGNOSIS — A419 Sepsis, unspecified organism: Secondary | ICD-10-CM | POA: Diagnosis not present

## 2015-01-11 DIAGNOSIS — Z833 Family history of diabetes mellitus: Secondary | ICD-10-CM | POA: Diagnosis not present

## 2015-01-11 DIAGNOSIS — N390 Urinary tract infection, site not specified: Secondary | ICD-10-CM | POA: Diagnosis present

## 2015-01-11 DIAGNOSIS — R740 Nonspecific elevation of levels of transaminase and lactic acid dehydrogenase [LDH]: Secondary | ICD-10-CM

## 2015-01-11 DIAGNOSIS — B957 Other staphylococcus as the cause of diseases classified elsewhere: Secondary | ICD-10-CM | POA: Diagnosis present

## 2015-01-11 DIAGNOSIS — Z515 Encounter for palliative care: Secondary | ICD-10-CM | POA: Insufficient documentation

## 2015-01-11 DIAGNOSIS — C3411 Malignant neoplasm of upper lobe, right bronchus or lung: Secondary | ICD-10-CM | POA: Diagnosis present

## 2015-01-11 DIAGNOSIS — Z66 Do not resuscitate: Secondary | ICD-10-CM | POA: Diagnosis present

## 2015-01-11 DIAGNOSIS — R5383 Other fatigue: Secondary | ICD-10-CM | POA: Diagnosis present

## 2015-01-11 DIAGNOSIS — Z823 Family history of stroke: Secondary | ICD-10-CM

## 2015-01-11 DIAGNOSIS — I10 Essential (primary) hypertension: Secondary | ICD-10-CM | POA: Diagnosis present

## 2015-01-11 DIAGNOSIS — Z801 Family history of malignant neoplasm of trachea, bronchus and lung: Secondary | ICD-10-CM

## 2015-01-11 DIAGNOSIS — Y95 Nosocomial condition: Secondary | ICD-10-CM | POA: Diagnosis present

## 2015-01-11 DIAGNOSIS — Z8249 Family history of ischemic heart disease and other diseases of the circulatory system: Secondary | ICD-10-CM | POA: Diagnosis not present

## 2015-01-11 DIAGNOSIS — K219 Gastro-esophageal reflux disease without esophagitis: Secondary | ICD-10-CM | POA: Diagnosis present

## 2015-01-11 DIAGNOSIS — C3490 Malignant neoplasm of unspecified part of unspecified bronchus or lung: Secondary | ICD-10-CM | POA: Diagnosis present

## 2015-01-11 DIAGNOSIS — Z79891 Long term (current) use of opiate analgesic: Secondary | ICD-10-CM | POA: Diagnosis not present

## 2015-01-11 DIAGNOSIS — Z7189 Other specified counseling: Secondary | ICD-10-CM | POA: Insufficient documentation

## 2015-01-11 DIAGNOSIS — C7931 Secondary malignant neoplasm of brain: Secondary | ICD-10-CM | POA: Diagnosis present

## 2015-01-11 DIAGNOSIS — Z87891 Personal history of nicotine dependence: Secondary | ICD-10-CM | POA: Diagnosis not present

## 2015-01-11 DIAGNOSIS — R7401 Elevation of levels of liver transaminase levels: Secondary | ICD-10-CM

## 2015-01-11 LAB — URINALYSIS, ROUTINE W REFLEX MICROSCOPIC
Glucose, UA: NEGATIVE mg/dL
Hgb urine dipstick: NEGATIVE
Ketones, ur: NEGATIVE mg/dL
NITRITE: POSITIVE — AB
SPECIFIC GRAVITY, URINE: 1.02 (ref 1.005–1.030)
pH: 6 (ref 5.0–8.0)

## 2015-01-11 LAB — URINE MICROSCOPIC-ADD ON

## 2015-01-11 LAB — CBC WITH DIFFERENTIAL/PLATELET
BASOS ABS: 0 10*3/uL (ref 0.0–0.1)
BASOS PCT: 0 %
EOS ABS: 0 10*3/uL (ref 0.0–0.7)
EOS PCT: 0 %
HCT: 33.4 % — ABNORMAL LOW (ref 39.0–52.0)
Hemoglobin: 11.3 g/dL — ABNORMAL LOW (ref 13.0–17.0)
Lymphocytes Relative: 3 %
Lymphs Abs: 0.5 10*3/uL — ABNORMAL LOW (ref 0.7–4.0)
MCH: 24.7 pg — ABNORMAL LOW (ref 26.0–34.0)
MCHC: 33.8 g/dL (ref 30.0–36.0)
MCV: 73.1 fL — AB (ref 78.0–100.0)
MONO ABS: 2 10*3/uL — AB (ref 0.1–1.0)
Monocytes Relative: 11 %
Neutro Abs: 16.2 10*3/uL — ABNORMAL HIGH (ref 1.7–7.7)
Neutrophils Relative %: 86 %
PLATELETS: 355 10*3/uL (ref 150–400)
RBC: 4.57 MIL/uL (ref 4.22–5.81)
RDW: 19.6 % — AB (ref 11.5–15.5)
WBC: 18.7 10*3/uL — AB (ref 4.0–10.5)

## 2015-01-11 LAB — I-STAT CG4 LACTIC ACID, ED
LACTIC ACID, VENOUS: 4.08 mmol/L — AB (ref 0.5–2.0)
LACTIC ACID, VENOUS: 2.43 mmol/L — AB (ref 0.5–2.0)

## 2015-01-11 LAB — COMPREHENSIVE METABOLIC PANEL
ALT: 122 U/L — ABNORMAL HIGH (ref 17–63)
ANION GAP: 10 (ref 5–15)
AST: 193 U/L — AB (ref 15–41)
Albumin: 2.5 g/dL — ABNORMAL LOW (ref 3.5–5.0)
Alkaline Phosphatase: 259 U/L — ABNORMAL HIGH (ref 38–126)
BILIRUBIN TOTAL: 3.3 mg/dL — AB (ref 0.3–1.2)
BUN: 16 mg/dL (ref 6–20)
CHLORIDE: 99 mmol/L — AB (ref 101–111)
CO2: 25 mmol/L (ref 22–32)
Calcium: 10.1 mg/dL (ref 8.9–10.3)
Creatinine, Ser: 1.19 mg/dL (ref 0.61–1.24)
GFR calc Af Amer: >60 mL/min (ref >60)
GFR, EST NON AFRICAN AMERICAN: 58 mL/min — AB (ref >60)
Glucose, Bld: 123 mg/dL — ABNORMAL HIGH (ref 65–99)
POTASSIUM: 4.5 mmol/L (ref 3.5–5.1)
Sodium: 134 mmol/L — ABNORMAL LOW (ref 135–145)
TOTAL PROTEIN: 6 g/dL — AB (ref 6.5–8.1)

## 2015-01-11 LAB — TROPONIN I: TROPONIN I: 0.03 ng/mL (ref <0.031)

## 2015-01-11 MED ORDER — OXYCODONE HCL 5 MG PO TABS
5.0000 mg | ORAL_TABLET | Freq: Four times a day (QID) | ORAL | Status: DC | PRN
  Administered 2015-01-15 – 2015-01-16 (×2): 5 mg via ORAL
  Filled 2015-01-11 (×2): qty 1

## 2015-01-11 MED ORDER — ENOXAPARIN SODIUM 40 MG/0.4ML ~~LOC~~ SOLN
40.0000 mg | SUBCUTANEOUS | Status: DC
  Administered 2015-01-11 – 2015-01-17 (×7): 40 mg via SUBCUTANEOUS
  Filled 2015-01-11 (×7): qty 0.4

## 2015-01-11 MED ORDER — VANCOMYCIN HCL 10 G IV SOLR
1250.0000 mg | Freq: Once | INTRAVENOUS | Status: AC
  Administered 2015-01-11: 1250 mg via INTRAVENOUS
  Filled 2015-01-11: qty 1250

## 2015-01-11 MED ORDER — ACETAMINOPHEN 325 MG PO TABS: 650.0000 mg | ORAL_TABLET | Freq: Four times a day (QID) | ORAL | Status: DC | PRN

## 2015-01-11 MED ORDER — DIPHENOXYLATE-ATROPINE 2.5-0.025 MG PO TABS: 1.0000 | ORAL_TABLET | Freq: Four times a day (QID) | ORAL | Status: DC | PRN

## 2015-01-11 MED ORDER — SODIUM CHLORIDE 0.9 % IV SOLN
INTRAVENOUS | Status: DC
  Administered 2015-01-11 – 2015-01-16 (×4): via INTRAVENOUS
  Administered 2015-01-17: 50 mL/h via INTRAVENOUS

## 2015-01-11 MED ORDER — VANCOMYCIN HCL IN DEXTROSE 1-5 GM/200ML-% IV SOLN
1000.0000 mg | Freq: Two times a day (BID) | INTRAVENOUS | Status: DC
  Administered 2015-01-12 – 2015-01-17 (×10): 1000 mg via INTRAVENOUS
  Filled 2015-01-11 (×11): qty 200

## 2015-01-11 MED ORDER — SODIUM CHLORIDE 0.9 % IV BOLUS (SEPSIS)
1000.0000 mL | INTRAVENOUS | Status: AC
  Administered 2015-01-11: 1000 mL via INTRAVENOUS

## 2015-01-11 MED ORDER — LORAZEPAM 1 MG PO TABS
1.0000 mg | ORAL_TABLET | Freq: Two times a day (BID) | ORAL | Status: DC | PRN
  Administered 2015-01-13 (×2): 1 mg via ORAL
  Filled 2015-01-11 (×2): qty 1

## 2015-01-11 MED ORDER — VANCOMYCIN HCL IN DEXTROSE 1-5 GM/200ML-% IV SOLN
INTRAVENOUS | Status: AC
  Filled 2015-01-11: qty 200

## 2015-01-11 MED ORDER — MORPHINE SULFATE ER 15 MG PO TBCR
15.0000 mg | EXTENDED_RELEASE_TABLET | Freq: Two times a day (BID) | ORAL | Status: DC
  Administered 2015-01-11 – 2015-01-16 (×8): 15 mg via ORAL
  Filled 2015-01-11 (×11): qty 1

## 2015-01-11 MED ORDER — PIPERACILLIN-TAZOBACTAM 3.375 G IVPB
3.3750 g | Freq: Three times a day (TID) | INTRAVENOUS | Status: DC
  Administered 2015-01-11 – 2015-01-17 (×16): 3.375 g via INTRAVENOUS
  Filled 2015-01-11 (×18): qty 50

## 2015-01-11 MED ORDER — ACETAMINOPHEN 650 MG RE SUPP: 650.0000 mg | Freq: Four times a day (QID) | RECTAL | Status: DC | PRN

## 2015-01-11 MED ORDER — PIPERACILLIN-TAZOBACTAM 3.375 G IVPB 30 MIN
3.3750 g | Freq: Once | INTRAVENOUS | Status: AC
  Administered 2015-01-11: 3.375 g via INTRAVENOUS
  Filled 2015-01-11: qty 50

## 2015-01-11 MED ORDER — PIPERACILLIN-TAZOBACTAM 3.375 G IVPB
INTRAVENOUS | Status: AC
  Filled 2015-01-11: qty 100

## 2015-01-11 MED ORDER — SENNOSIDES-DOCUSATE SODIUM 8.6-50 MG PO TABS
2.0000 | ORAL_TABLET | Freq: Two times a day (BID) | ORAL | Status: DC | PRN
Start: 2015-01-11 — End: 2015-01-18
  Filled 2015-01-11: qty 2

## 2015-01-11 MED ORDER — METOPROLOL TARTRATE 25 MG PO TABS
25.0000 mg | ORAL_TABLET | Freq: Two times a day (BID) | ORAL | Status: DC
  Administered 2015-01-11 – 2015-01-16 (×8): 25 mg via ORAL
  Filled 2015-01-11 (×12): qty 1

## 2015-01-11 NOTE — ED Provider Notes (Signed)
CSN: [id number]     Arrival date & time 01/11/15  1331 History   First MD Initiated Contact with Patient 01/11/15 1403     Chief Complaint  Patient presents with  . Fatigue     (Consider location/radiation/quality/duration/timing/severity/associated sxs/prior Treatment) Patient is a 74 y.o. male presenting with general illness.  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 Hypertension   . Abnormal MRI, spine 05/12/2011  . Nausea 01/2014  . BPH (benign prostatic hypertrophy)   . Hx of adenomatous colonic polyps     h/o tubulovillous adenomas  . Diverticulitis   . Lung mass     Right Upper Lobe  . GERD (gastroesophageal reflux disease)   . Blurred vision   . Brain metastasis ([place])   . Brain cancer ([place])   . Lung cancer ([place])   . DNR (do not resuscitate) 12/12/2014   Past Surgical History  Procedure Laterality Date  . Cholecystectomy  2009  . Polyp removal via colonoscopy    . Colonoscopy with esophagogastroduodenoscopy (egd) N/A 04/06/2013    ZWC:HENIDPOE size internal hemorrhoids/moderate diverticulosis/medium sized HH  . Tonsilectomy, adenoidectomy, bilateral myringotomy and tubes    . Video bronchoscopy with endobronchial ultrasound N/A 10/29/2014    Procedure: ATTEMPTED VIDEO BRONCHOSCOPY WITH ENDOBRONCHIAL ULTRASOUND;  Surgeon: Javier Glazier, MD;  Location: [facility];  Service: Thoracic;  Laterality: N/A;  . Mediastinoscopy N/A 11/07/2014    Procedure: MEDIASTINOSCOPY;  Surgeon: Melrose Nakayama, MD;  Location: [facility];  Service:  Thoracic;  Laterality: N/A;   Family History  Problem Relation Age of Onset  . Diabetes Father   . Diabetes Sister   . Lung cancer Brother   . CVA Brother   . Colon cancer Neg Hx   . CAD Brother   . Heart attack Neg Hx   . Hypertension Brother   . Stroke Brother    Social History  Substance Use Topics  . Smoking status: Former Smoker -- 0.50 packs/day for 40 years    Types: Cigarettes    Quit date: 10/17/2009  . Smokeless tobacco: Never Used  . Alcohol Use: No     Comment: Remote EtOH    Review of Systems  Constitutional: Negative for fatigue.  HENT: Positive for congestion. Negative for rhinorrhea and sore throat.   Respiratory: Positive for cough.   Cardiovascular: Negative for chest pain.  Gastrointestinal: Negative for nausea and abdominal pain.  Musculoskeletal: Negative for myalgias.  All other systems reviewed and are negative.     Allergies  Reglan  Home Medications   Prior to Admission medications   Medication Sig Start Date End Date Taking? Authorizing Provider  diphenoxylate-atropine (LOMOTIL) 2.5-0.025 MG tablet Take 1 tablet by mouth 4 (four) times daily as needed for diarrhea or loose stools.   Yes Historical Provider, MD  folic acid (FOLVITE) 1 MG tablet Take 1 tablet by mouth daily and for 21 days after the completion of Pemetrexed chemo. 12/02/14  Yes Patrici Ranks, MD  LORazepam (ATIVAN) 1 MG tablet Take 1 mg by mouth 2 (two) times  daily as needed for anxiety.  11/11/14  Yes Historical Provider, MD  morphine (MS CONTIN) 15 MG 12 hr tablet Take 15 mg by mouth every 12 (twelve) hours.   Yes Historical Provider, MD  omeprazole (PRILOSEC) 20 MG capsule 1 PO 30 MINS PRIOR TO BREAKFAST AND AT BEDTIME 08/22/14  Yes Danie Binder, MD  ondansetron (ZOFRAN ODT) 4 MG disintegrating tablet Take 1 tablet (4 mg total) by mouth every 6 (six) hours as needed for nausea. Patient taking differently: Take 4 mg by mouth at bedtime.  05/09/14  Yes Danie Binder, MD   oxyCODONE (OXY IR/ROXICODONE) 5 MG immediate release tablet Take 5 mg by mouth every 6 (six) hours as needed for moderate pain or severe pain.   Yes Historical Provider, MD  PEMEtrexed (ALIMTA) 500 MG injection Inject into the vein every 21 ( twenty-one) days. To start 12/06/14   Yes Historical Provider, MD  Sennosides-Docusate Sodium (SENNA) 8.6-50 MG TABS Take 4 tablets by mouth 3 (three) times daily as needed (constipation).   Yes Historical Provider, MD  traMADol (ULTRAM) 50 MG tablet Take 50-100 mg by mouth every 6 (six) hours as needed for moderate pain.   Yes Historical Provider, MD  dexamethasone (DECADRON) 4 MG tablet Start taking 1 tablet ('4mg'$ ) twice daily with food. On Oct 7, continue to taper as instructed. Patient not taking: Reported on 01/11/2015 11/15/14   Eppie Gibson, MD  Diphenhyd-Hydrocort-Nystatin (FIRST-DUKES MOUTHWASH) SUSP Use as directed 5 mLs in the mouth or throat 4 (four) times daily as needed (Swish and swallow for oral thrush). 12/12/14   Baird Cancer, PA-C  fluconazole (DIFLUCAN) 100 MG tablet Take 1 tablet (100 mg total) by mouth daily. 12/12/14   Baird Cancer, PA-C  metoprolol tartrate (LOPRESSOR) 25 MG tablet Take 1 tablet (25 mg total) by mouth 2 (two) times daily. 11/08/14   Coolidge Breeze, PA-C  sulfamethoxazole-trimethoprim (BACTRIM DS,SEPTRA DS) 800-160 MG tablet Take 1 tablet by mouth 2 (two) times daily. Patient not taking: Reported on 12/12/2014 12/05/14   Asencion Noble, MD   BP 111/68 mmHg  Pulse 109  Temp(Src) 97.4 F (36.3 C) (Oral)  Resp 16  SpO2 94% Physical Exam  Constitutional: He is oriented to person, place, and time. He appears well-developed and well-nourished.  HENT:  Head: Normocephalic and atraumatic.  Eyes: Conjunctivae are normal. Pupils are equal, round, and reactive to light.  Neck: Normal range of motion.  Cardiovascular: Regular rhythm, normal heart sounds and intact distal pulses.  Tachycardia present.   Pulmonary/Chest:  Effort normal. Tachypnea noted. No respiratory distress.  Abdominal: Soft. Bowel sounds are normal. He exhibits no distension. There is no tenderness.  Musculoskeletal: Normal range of motion. He exhibits no edema or tenderness.  Neurological: He is alert and oriented to person, place, and time. No cranial nerve deficit. Coordination normal.  Skin: Skin is warm and dry.  Nursing note and vitals reviewed.   ED Course  Procedures (including critical care time)  CRITICAL CARE Performed by: Merrily Pew   Total critical care time: 35 minutes  Critical care time was exclusive of separately billable procedures and treating other patients.  Critical care was necessary to treat or prevent imminent or life-threatening deterioration.  Critical care was time spent personally by me on the following activities: development of treatment plan with patient and/or surrogate as well as nursing, discussions with consultants, evaluation of patient's response to treatment, examination of patient, obtaining history from patient or surrogate, ordering and performing  treatments and interventions, ordering and review of laboratory studies, ordering and review of radiographic studies, pulse oximetry and re-evaluation of patient's condition.   Labs Review Labs Reviewed  COMPREHENSIVE METABOLIC PANEL - Abnormal; Notable for the following:    Sodium 134 (*)    Chloride 99 (*)    Glucose, Bld 123 (*)    Total Protein 6.0 (*)    Albumin 2.5 (*)    AST 193 (*)    ALT 122 (*)    Alkaline Phosphatase 259 (*)    Total Bilirubin 3.3 (*)    GFR calc non Af Amer 58 (*)    All other components within normal limits  CBC WITH DIFFERENTIAL/PLATELET - Abnormal; Notable for the following:    WBC 18.7 (*)    Hemoglobin 11.3 (*)    HCT 33.4 (*)    MCV 73.1 (*)    MCH 24.7 (*)    RDW 19.6 (*)    Neutro Abs 16.2 (*)    Lymphs Abs 0.5 (*)    Monocytes Absolute 2.0 (*)    All other components within normal limits   URINALYSIS, ROUTINE W REFLEX MICROSCOPIC (NOT AT [facility]) - Abnormal; Notable for the following:    Bilirubin Urine MODERATE (*)    Protein, ur TRACE (*)    Nitrite POSITIVE (*)    Leukocytes, UA TRACE (*)    All other components within normal limits  URINE MICROSCOPIC-ADD ON - Abnormal; Notable for the following:    Squamous Epithelial / LPF 0-5 (*)    Bacteria, UA MANY (*)    All other components within normal limits  I-STAT CG4 LACTIC ACID, ED - Abnormal; Notable for the following:    Lactic Acid, Venous 4.08 (*)    All other components within normal limits  I-STAT CG4 LACTIC ACID, ED - Abnormal; Notable for the following:    Lactic Acid, Venous 2.43 (*)    All other components within normal limits  CULTURE, BLOOD (ROUTINE X 2)  CULTURE, BLOOD (ROUTINE X 2)  URINE CULTURE  TROPONIN I  CBC  COMPREHENSIVE METABOLIC PANEL    Imaging Review No results found. I have personally reviewed and evaluated these images and lab results as part of my medical decision-making.   EKG Interpretation   Date/Time:  Saturday January 11 2015 14:14:53 EST Ventricular Rate:  107 PR Interval:  133 QRS Duration: 94 QT Interval:  318 QTC Calculation: 424 R Axis:   59 Text Interpretation:  Sinus tachycardia Low voltage, extremity leads  Abnormal R-wave progression, late transition Confirmed by KOHUT  MD,  STEPHEN ([id number]) on 01/11/2015 2:49:55 PM      MDM   Final diagnoses:  Pneumonia  Transaminitis  sepsis Metastatic lung cancer Lactic acidemia  74 year old male with metastatic lung cancer presents to the emergency department with cough, malaise, weakness, congestion and wheezing progressively worsening over the last 3-4 days. Tachypneic tachycardic and sleepy on exam. Lungs with faint crackles in all lobes. Sepsis protocol initiated aside from: Sepsis as the patient did not have a fever at this time. However cultures were drawn. Lactic acid was greater than 4, chest x-ray with evidence of  likely pneumonia in the left lower lobe. Back and Zosyn started secondary to his immune compromised state. With that able to obtain urine in the emergency department repeat lactic acid was less than 3. Blood pressure stable whole time emergency department heart rate still slightly elevated. Admitted to the hospital for further IV antibiotics secondary to sepsis secondary  to pneumonia.    Merrily Pew, MD 01/11/15 2117

## 2015-01-11 NOTE — ED Notes (Signed)
Patient to go up after shift change.

## 2015-01-11 NOTE — H&P (Signed)
PCP:   Asencion Noble, MD   Chief Complaint:  Fatigue  HPI:  74 year old male who  has a past medical history of Hypertension; Abnormal MRI, spine (05/12/2011); Nausea (01/2014); BPH (benign prostatic hypertrophy); adenomatous colonic polyps; Diverticulitis; Lung mass; GERD (gastroesophageal reflux disease); Blurred vision; Brain metastasis ([place]); Brain cancer ([place]); Lung cancer ([place]); and DNR (do not resuscitate) (12/12/2014). Today was brought to the hospital for fatigue for past 1 week. Patient has a history of stage IV non-small cell lung cancer, adenocarcinoma with brain metastasis status post radiation treatment. Patient has been on steroids which were tapered off and stopped on 17th November. Patient has been on hospice and was taken off hospice to try a new chemotherapy as per Dr. Abran Duke. As per family members patient has not been eating and drinking well over the past 1 week. He also complains of shortness of breath. No chest pain. No nausea vomiting or diarrhea. This morning patient was so fatigued as he was unable to get up from the bed. No fever, no dysuria urgency frequency of urination. In the ED patient was found to have pneumonia and started on antibiotics.  Allergies:   Allergies  Allergen Reactions  . Reglan [Metoclopramide]     ANXIETY, AGITATION, JITTERY      Past Medical History  Diagnosis Date  . Hypertension   . Abnormal MRI, spine 05/12/2011  . Nausea 01/2014  . BPH (benign prostatic hypertrophy)   . Hx of adenomatous colonic polyps     h/o tubulovillous adenomas  . Diverticulitis   . Lung mass     Right Upper Lobe  . GERD (gastroesophageal reflux disease)   . Blurred vision   . Brain metastasis ([place])   . Brain cancer ([place])   . Lung cancer ([place])   . DNR (do not resuscitate) 12/12/2014    Past Surgical History  Procedure Laterality Date  . Cholecystectomy  2009  . Polyp removal via colonoscopy    . Colonoscopy with esophagogastroduodenoscopy (egd) N/A  04/06/2013    HUD:JSHFWYOV size internal hemorrhoids/moderate diverticulosis/medium sized HH  . Tonsilectomy, adenoidectomy, bilateral myringotomy and tubes    . Video bronchoscopy with endobronchial ultrasound N/A 10/29/2014    Procedure: ATTEMPTED VIDEO BRONCHOSCOPY WITH ENDOBRONCHIAL ULTRASOUND;  Surgeon: Javier Glazier, MD;  Location: [facility];  Service: Thoracic;  Laterality: N/A;  . Mediastinoscopy N/A 11/07/2014    Procedure: MEDIASTINOSCOPY;  Surgeon: Melrose Nakayama, MD;  Location: [facility];  Service: Thoracic;  Laterality: N/A;    Prior to Admission medications   Medication Sig Start Date End Date Taking? Authorizing Provider  Diphenhyd-Hydrocort-Nystatin (FIRST-DUKES MOUTHWASH) SUSP Use as directed 5 mLs in the mouth or throat 4 (four) times daily as needed (Swish and swallow for oral thrush). 12/12/14  Yes Manon Hilding Kefalas, PA-C  diphenoxylate-atropine (LOMOTIL) 2.5-0.025 MG tablet Take 1 tablet by mouth 4 (four) times daily as needed for diarrhea or loose stools.   Yes Historical Provider, MD  folic acid (FOLVITE) 1 MG tablet Take 1 tablet by mouth daily and for 21 days after the completion of Pemetrexed chemo. 12/02/14  Yes Patrici Ranks, MD  LORazepam (ATIVAN) 1 MG tablet Take 1 mg by mouth 2 (two) times daily as needed for anxiety.  11/11/14  Yes Historical Provider, MD  morphine (MS CONTIN) 15 MG 12 hr tablet Take 15 mg by mouth every 12 (twelve) hours.   Yes Historical Provider, MD  omeprazole (PRILOSEC) 20 MG capsule 1 PO 30 MINS PRIOR TO BREAKFAST AND  AT BEDTIME 08/22/14  Yes Danie Binder, MD  ondansetron (ZOFRAN ODT) 4 MG disintegrating tablet Take 1 tablet (4 mg total) by mouth every 6 (six) hours as needed for nausea. Patient taking differently: Take 4 mg by mouth at bedtime.  05/09/14  Yes Danie Binder, MD  oxyCODONE (OXY IR/ROXICODONE) 5 MG immediate release tablet Take 5 mg by mouth every 6 (six) hours as needed for moderate pain or severe pain.   Yes Historical  Provider, MD  PEMEtrexed (ALIMTA) 500 MG injection Inject into the vein every 21 ( twenty-one) days. To start 12/06/14   Yes Historical Provider, MD  Sennosides-Docusate Sodium (SENNA) 8.6-50 MG TABS Take 4 tablets by mouth 3 (three) times daily as needed (constipation).   Yes Historical Provider, MD  traMADol (ULTRAM) 50 MG tablet Take 50-100 mg by mouth every 6 (six) hours as needed for moderate pain.   Yes Historical Provider, MD  dexamethasone (DECADRON) 4 MG tablet Start taking 1 tablet ('4mg'$ ) twice daily with food. On Oct 7, continue to taper as instructed. Patient not taking: Reported on 01/11/2015 11/15/14   Eppie Gibson, MD  fluconazole (DIFLUCAN) 100 MG tablet Take 1 tablet (100 mg total) by mouth daily. 12/12/14   Baird Cancer, PA-C  metoprolol tartrate (LOPRESSOR) 25 MG tablet Take 1 tablet (25 mg total) by mouth 2 (two) times daily. 11/08/14   Coolidge Breeze, PA-C  sulfamethoxazole-trimethoprim (BACTRIM DS,SEPTRA DS) 800-160 MG tablet Take 1 tablet by mouth 2 (two) times daily. Patient not taking: Reported on 12/12/2014 12/05/14   Asencion Noble, MD    Social History:  reports that he quit smoking about 5 years ago. His smoking use included Cigarettes. He has a 20 pack-year smoking history. He has never used smokeless tobacco. He reports that he does not drink alcohol or use illicit drugs.  Family History  Problem Relation Age of Onset  . Diabetes Father   . Diabetes Sister   . Lung cancer Brother   . CVA Brother   . Colon cancer Neg Hx   . CAD Brother   . Heart attack Neg Hx   . Hypertension Brother   . Stroke Brother     Filed Weights   01/11/15 1404  Weight: 77.9 kg (171 lb 11.8 oz)    All the positives are listed in BOLD  Review of Systems:  HEENT: Headache, blurred vision, runny nose, sore throat Neck: Hypothyroidism, hyperthyroidism,,lymphadenopathy Chest : Shortness of breath, history of COPD, Asthma Heart : Chest pain, history of coronary arterey disease GI:   Nausea, vomiting, diarrhea, constipation, GERD GU: Dysuria, urgency, frequency of urination, hematuria Neuro: Stroke, seizures, syncope Psych: Depression, anxiety, hallucinations   Physical Exam: Blood pressure 114/73, pulse 97, temperature 97.4 F (36.3 C), temperature source Oral, resp. rate 20, weight 77.9 kg (171 lb 11.8 oz), SpO2 94 %. Constitutional:   Patient is a well-developed and well-nourished male* in no acute distress and cooperative with exam. Head: Normocephalic and atraumatic Mouth: Mucus membranes moist Eyes: PERRL, EOMI, conjunctivae normal Neck: Supple, No Thyromegaly Cardiovascular: RRR, S1 normal, S2 normal Pulmonary/Chest: CTAB, no wheezes, rales, or rhonchi Abdominal: Soft. Non-tender, non-distended, bowel sounds are normal, no masses, organomegaly, or guarding present.  Neurological: A&O x3, Strength is normal and symmetric bilaterally, cranial nerve II-XII are grossly intact, no focal motor deficit, sensory intact to light touch bilaterally.  Extremities : No Cyanosis, Clubbing or Edema  Labs on Admission:  Basic Metabolic Panel:  Recent Labs Lab 01/11/15 1518  NA 134*  K 4.5  CL 99*  CO2 25  GLUCOSE 123*  BUN 16  CREATININE 1.19  CALCIUM 10.1   Liver Function Tests:  Recent Labs Lab 01/11/15 1518  AST 193*  ALT 122*  ALKPHOS 259*  BILITOT 3.3*  PROT 6.0*  ALBUMIN 2.5*   CBC:  Recent Labs Lab 01/11/15 1518  WBC 18.7*  NEUTROABS 16.2*  HGB 11.3*  HCT 33.4*  MCV 73.1*  PLT 355   Cardiac Enzymes:  Recent Labs Lab 01/11/15 1518  TROPONINI 0.03     Radiological Exams on Admission: Dg Chest Port 1 View  01/11/2015  CLINICAL DATA:  Fever.  Metastatic lung cancer to the brain. EXAM: PORTABLE CHEST 1 VIEW COMPARISON:  Chest x-rays dated 11/30/2014 and 11/07/2014 FINDINGS: The heart size and pulmonary vascularity are normal. There is no area of consolidation/ atelectasis at the left lung base which is new with air bronchograms. The  patient has taken a shallow inspiration, creating slight atelectasis at the right base. There is a right upper lobe mass medially as demonstrated on the prior PET-CT of 10/21/2014. The patient also has right hilar adenopathy demonstrated on the PET-CT. Calcification in the thoracic aorta.  No acute osseous abnormality. IMPRESSION: New left lower lobe atelectasis and consolidation posterior medially. This could represent pneumonia. Electronically Signed   By: Lorriane Shire M.D.   On: 01/11/2015 15:47    EKG: Independently reviewed. Sinus tachycardia   Assessment/Plan Active Problems:   Non-small cell carcinoma of lung ([place])   Pneumonia   HCAP (healthcare-associated pneumonia)   Transaminitis  Healthcare associated pneumonia We'll admit the patient, start vancomycin and Zosyn per pharmacy consultation. Obtain blood cultures 2.  Transaminitis ? Liver metastasis Patient has elevated liver enzymes, with alkaline phosphatase 259, AST and ALT 193/122 respectively, total bili been 3.3. Will obtain abdominal ultrasound.   Non-small cell lung cancer Patient has non-small cell lung cancer with brain metastasis, undergoing chemotherapy as per Dr. Abran Duke. Patient is currently not enrolled in hospice as per family. Continue MS Contin, oxycodone when necessary.  DVT prophylaxis Lovenox  Code status: Full code  Family discussion: Admission, patients condition and plan of care including tests being ordered have been discussed with the patient and *his family members at bedside* who indicate understanding and agree with the plan and Code Status.   Time Spent on Admission: 60 minutes  [facility] Pager: [phone] 01/11/2015, 7:34 PM  If 7PM-7AM, please contact night-coverage  [email or web address]  Password [email or web address]

## 2015-01-11 NOTE — ED Notes (Signed)
MD at bedside. 

## 2015-01-11 NOTE — ED Notes (Signed)
Pt is a lung cancer patient currently receiving treatment. Family reports for the past week, pt has been extremely weak with a few episodes of diarrhea. Reports that it "sounds like he is wheezing." Pt lethargic

## 2015-01-11 NOTE — ED Notes (Signed)
AC called for vancomycin.

## 2015-01-11 NOTE — Progress Notes (Signed)
ANTIBIOTIC CONSULT NOTE - INITIAL  Pharmacy Consult for Vancomycin and Zosyn Indication: HCAP  Allergies  Allergen Reactions  . Reglan [Metoclopramide]     ANXIETY, AGITATION, JITTERY    Patient Measurements: Height: '6\' 1"'$  (185.4 cm) Weight: 171 lb 8.3 oz (77.8 kg) IBW/kg (Calculated) : 79.9kg  Vital Signs: Temp: 98.2 F (36.8 C) (11/19 1946) Temp Source: Axillary (11/19 1946) BP: 114/75 mmHg (11/19 1946) Pulse Rate: 103 (11/19 1946) Intake/Output from previous day:   Intake/Output from this shift:    Labs:  Recent Labs  01/11/15 1518  WBC 18.7*  HGB 11.3*  PLT 355  CREATININE 1.19   Estimated Creatinine Clearance: 59.9 mL/min (by C-G formula based on Cr of 1.19). No results for input(s): VANCOTROUGH, VANCOPEAK, VANCORANDOM, GENTTROUGH, GENTPEAK, GENTRANDOM, TOBRATROUGH, TOBRAPEAK, TOBRARND, AMIKACINPEAK, AMIKACINTROU, AMIKACIN in the last 72 hours.   Microbiology: Recent Results (from the past 720 hour(s))  Blood Culture (routine x 2)     Status: None (Preliminary result)   Collection Time: 01/11/15  3:20 PM  Result Value Ref Range Status   Specimen Description BLOOD LEFT ARM  Final   Special Requests BOTTLES DRAWN AEROBIC AND ANAEROBIC 10CC  Final   Culture PENDING  Incomplete   Report Status PENDING  Incomplete  Blood Culture (routine x 2)     Status: None (Preliminary result)   Collection Time: 01/11/15  3:30 PM  Result Value Ref Range Status   Specimen Description BLOOD RIGHT HAND  Final   Special Requests BOTTLES DRAWN AEROBIC AND ANAEROBIC 8CC  Final   Culture  Setup Time PENDING  Incomplete   Culture PENDING  Incomplete   Report Status PENDING  Incomplete    Medical History: Past Medical History  Diagnosis Date  . Hypertension   . Abnormal MRI, spine 05/12/2011  . Nausea 01/2014  . BPH (benign prostatic hypertrophy)   . Hx of adenomatous colonic polyps     h/o tubulovillous adenomas  . Diverticulitis   . Lung mass     Right Upper Lobe  .  GERD (gastroesophageal reflux disease)   . Blurred vision   . Brain metastasis ([place])   . Brain cancer ([place])   . Lung cancer ([place])   . DNR (do not resuscitate) 12/12/2014    Medications:  Prescriptions prior to admission  Medication Sig Dispense Refill Last Dose  . Diphenhyd-Hydrocort-Nystatin (FIRST-DUKES MOUTHWASH) SUSP Use as directed 5 mLs in the mouth or throat 4 (four) times daily as needed (Swish and swallow for oral thrush). 240 mL 2 01/11/2015  . diphenoxylate-atropine (LOMOTIL) 2.5-0.025 MG tablet Take 1 tablet by mouth 4 (four) times daily as needed for diarrhea or loose stools.   01/10/2015 at Unknown time  . folic acid (FOLVITE) 1 MG tablet Take 1 tablet by mouth daily and for 21 days after the completion of Pemetrexed chemo. 30 tablet 5 12/06/2014  . LORazepam (ATIVAN) 1 MG tablet Take 1 mg by mouth 2 (two) times daily as needed for anxiety.    01/11/2015 at Unknown time  . morphine (MS CONTIN) 15 MG 12 hr tablet Take 15 mg by mouth every 12 (twelve) hours.   01/10/2015 at Unknown time  . omeprazole (PRILOSEC) 20 MG capsule 1 PO 30 MINS PRIOR TO BREAKFAST AND AT BEDTIME 180 capsule 3 01/11/2015 at Unknown time  . ondansetron (ZOFRAN ODT) 4 MG disintegrating tablet Take 1 tablet (4 mg total) by mouth every 6 (six) hours as needed for nausea. (Patient taking differently: Take 4 mg by mouth  at bedtime. ) 30 tablet 3 01/10/2015 at Unknown time  . oxyCODONE (OXY IR/ROXICODONE) 5 MG immediate release tablet Take 5 mg by mouth every 6 (six) hours as needed for moderate pain or severe pain.   01/10/2015 at Unknown time  . PEMEtrexed (ALIMTA) 500 MG injection Inject into the vein every 21 ( twenty-one) days. To start 12/06/14   12/06/2014  . Sennosides-Docusate Sodium (SENNA) 8.6-50 MG TABS Take 4 tablets by mouth 3 (three) times daily as needed (constipation).   01/10/2015 at Unknown time  . traMADol (ULTRAM) 50 MG tablet Take 50-100 mg by mouth every 6 (six) hours as needed for moderate  pain.   01/11/2015 at Unknown time  . dexamethasone (DECADRON) 4 MG tablet Start taking 1 tablet ('4mg'$ ) twice daily with food. On Oct 7, continue to taper as instructed. (Patient not taking: Reported on 01/11/2015) 120 tablet 0 Completed Course at Unknown time  . fluconazole (DIFLUCAN) 100 MG tablet Take 1 tablet (100 mg total) by mouth daily. 20 tablet 0   . metoprolol tartrate (LOPRESSOR) 25 MG tablet Take 1 tablet (25 mg total) by mouth 2 (two) times daily. 60 tablet 1 Taking  . sulfamethoxazole-trimethoprim (BACTRIM DS,SEPTRA DS) 800-160 MG tablet Take 1 tablet by mouth 2 (two) times daily. (Patient not taking: Reported on 12/12/2014) 20 tablet 0 Completed Course at Unknown time   Assessment:  74 yo male brought to the hospital for fatigue for past 1 week. Patient has a history of stage IV non-small cell lung cancer, adenocarcinoma with brain metastasis status post radiation treatment. Patient has been on steroids which were tapered off and stopped on 17th November. Chest xray  Showed left lower pneumonia. Empiric broad spectrum antibiotics with Vancomycin and Zosyn to be initiated. Pt is afebrile. WBC elevated. Blood cultures obtained.  Goal of Therapy:  Vancomycin trough level 15-20 mcg/ml  Plan:  Zosyn 3.375 gm iv q8h, extended dose infusion Vancomycin '1250mg'$  given in ED, continue with 1gm iv q12h Measure antibiotic drug levels at steady state Follow up culture results  Monitor V/S and labs  Isac Sarna, BS [place], [place] Pharmacist Pager [phone] 01/11/2015,8:16 PM

## 2015-01-12 LAB — COMPREHENSIVE METABOLIC PANEL
ALBUMIN: 2.3 g/dL — AB (ref 3.5–5.0)
ALT: 108 U/L — ABNORMAL HIGH (ref 17–63)
ANION GAP: 9 (ref 5–15)
AST: 196 U/L — AB (ref 15–41)
Alkaline Phosphatase: 259 U/L — ABNORMAL HIGH (ref 38–126)
BUN: 11 mg/dL (ref 6–20)
CHLORIDE: 102 mmol/L (ref 101–111)
CO2: 25 mmol/L (ref 22–32)
Calcium: 9.5 mg/dL (ref 8.9–10.3)
Creatinine, Ser: 0.79 mg/dL (ref 0.61–1.24)
GFR calc Af Amer: >60 mL/min (ref >60)
GFR calc non Af Amer: >60 mL/min (ref >60)
GLUCOSE: 92 mg/dL (ref 65–99)
POTASSIUM: 4.4 mmol/L (ref 3.5–5.1)
SODIUM: 136 mmol/L (ref 135–145)
Total Bilirubin: 3.8 mg/dL — ABNORMAL HIGH (ref 0.3–1.2)
Total Protein: 5.4 g/dL — ABNORMAL LOW (ref 6.5–8.1)

## 2015-01-12 LAB — CBC
HEMATOCRIT: 29.4 % — AB (ref 39.0–52.0)
Hemoglobin: 9.9 g/dL — ABNORMAL LOW (ref 13.0–17.0)
MCH: 24.3 pg — ABNORMAL LOW (ref 26.0–34.0)
MCHC: 33.7 g/dL (ref 30.0–36.0)
MCV: 72.2 fL — ABNORMAL LOW (ref 78.0–100.0)
PLATELETS: 297 10*3/uL (ref 150–400)
RBC: 4.07 MIL/uL — ABNORMAL LOW (ref 4.22–5.81)
RDW: 19.6 % — AB (ref 11.5–15.5)
WBC: 14.5 10*3/uL — AB (ref 4.0–10.5)

## 2015-01-12 MED ORDER — GUAIFENESIN ER 600 MG PO TB12
1200.0000 mg | ORAL_TABLET | Freq: Two times a day (BID) | ORAL | Status: DC
  Administered 2015-01-12 – 2015-01-16 (×6): 1200 mg via ORAL
  Filled 2015-01-12 (×11): qty 2

## 2015-01-12 MED ORDER — VANCOMYCIN HCL IN DEXTROSE 1-5 GM/200ML-% IV SOLN
INTRAVENOUS | Status: AC
  Filled 2015-01-12: qty 200

## 2015-01-12 MED ORDER — IPRATROPIUM-ALBUTEROL 0.5-2.5 (3) MG/3ML IN SOLN
3.0000 mL | RESPIRATORY_TRACT | Status: DC
  Administered 2015-01-12 – 2015-01-14 (×9): 3 mL via RESPIRATORY_TRACT
  Filled 2015-01-12 (×11): qty 3

## 2015-01-12 MED ORDER — METHYLPREDNISOLONE SODIUM SUCC 40 MG IJ SOLR
40.0000 mg | Freq: Two times a day (BID) | INTRAMUSCULAR | Status: DC
  Administered 2015-01-12 – 2015-01-13 (×3): 40 mg via INTRAVENOUS
  Filled 2015-01-12 (×4): qty 1

## 2015-01-12 MED ORDER — PIPERACILLIN-TAZOBACTAM 3.375 G IVPB
INTRAVENOUS | Status: AC
  Filled 2015-01-12: qty 100

## 2015-01-12 NOTE — Progress Notes (Signed)
Subjective: He was admitted with healthcare associated pneumonia. Although he had previously had DO NOT RESUSCITATE status he now requests resuscitation. He is apparently due to start on a new chemotherapy but this has not started yet. He said he feels a little better  Objective: Vital signs in last 24 hours: Temp:  [97.4 F (36.3 C)-98.2 F (36.8 C)] 97.8 F (36.6 C) (11/20 0445) Pulse Rate:  [72-109] 95 (11/20 0445) Resp:  [16-25] 22 (11/20 0445) BP: (100-120)/(56-79) 114/73 mmHg (11/20 0445) SpO2:  [90 %-97 %] 96 % (11/20 0445) Weight:  [77.8 kg (171 lb 8.3 oz)-77.9 kg (171 lb 11.8 oz)] 77.8 kg (171 lb 8.3 oz) (11/19 1946) Weight change:     Intake/Output from previous day: 11/19 0701 - 11/20 0700 In: 1312.5 [I.V.:1012.5; IV Piggyback:300] Out: 300 [Urine:300]  PHYSICAL EXAM General appearance: alert, mild distress and slowed mentation Resp: rhonchi bilaterally Cardio: regular rate and rhythm, S1, S2 normal, no murmur, click, rub or gallop GI: soft, non-tender; bowel sounds normal; no masses,  no organomegaly Extremities: extremities normal, atraumatic, no cyanosis or edema  Lab Results:  Results for orders placed or performed during the hospital encounter of 01/11/15 (from the past 48 hour(s))  I-Stat CG4 Lactic Acid, ED  (not at  [facility])     Status: Abnormal   Collection Time: 01/11/15  3:18 PM  Result Value Ref Range   Lactic Acid, Venous 4.08 (HH) 0.5 - 2.0 mmol/L  Comprehensive metabolic panel     Status: Abnormal   Collection Time: 01/11/15  3:18 PM  Result Value Ref Range   Sodium 134 (L) 135 - 145 mmol/L   Potassium 4.5 3.5 - 5.1 mmol/L   Chloride 99 (L) 101 - 111 mmol/L   CO2 25 22 - 32 mmol/L   Glucose, Bld 123 (H) 65 - 99 mg/dL   BUN 16 6 - 20 mg/dL   Creatinine, Ser 1.19 0.61 - 1.24 mg/dL   Calcium 10.1 8.9 - 10.3 mg/dL   Total Protein 6.0 (L) 6.5 - 8.1 g/dL   Albumin 2.5 (L) 3.5 - 5.0 g/dL   AST 193 (H) 15 - 41 U/L   ALT 122 (H) 17 - 63 U/L   Alkaline  Phosphatase 259 (H) 38 - 126 U/L   Total Bilirubin 3.3 (H) 0.3 - 1.2 mg/dL   GFR calc non Af Amer 58 (L) >60 mL/min   GFR calc Af Amer >60 >60 mL/min    Comment: (NOTE) The eGFR has been calculated using the CKD EPI equation. This calculation has not been validated in all clinical situations. eGFR's persistently <60 mL/min signify possible Chronic Kidney Disease.    Anion gap 10 5 - 15  CBC WITH DIFFERENTIAL     Status: Abnormal   Collection Time: 01/11/15  3:18 PM  Result Value Ref Range   WBC 18.7 (H) 4.0 - 10.5 K/uL   RBC 4.57 4.22 - 5.81 MIL/uL   Hemoglobin 11.3 (L) 13.0 - 17.0 g/dL   HCT 33.4 (L) 39.0 - 52.0 %   MCV 73.1 (L) 78.0 - 100.0 fL   MCH 24.7 (L) 26.0 - 34.0 pg   MCHC 33.8 30.0 - 36.0 g/dL   RDW 19.6 (H) 11.5 - 15.5 %   Platelets 355 150 - 400 K/uL   Neutrophils Relative % 86 %   Neutro Abs 16.2 (H) 1.7 - 7.7 K/uL   Lymphocytes Relative 3 %   Lymphs Abs 0.5 (L) 0.7 - 4.0 K/uL   Monocytes Relative 11 %  Monocytes Absolute 2.0 (H) 0.1 - 1.0 K/uL   Eosinophils Relative 0 %   Eosinophils Absolute 0.0 0.0 - 0.7 K/uL   Basophils Relative 0 %   Basophils Absolute 0.0 0.0 - 0.1 K/uL  Troponin I     Status: None   Collection Time: 01/11/15  3:18 PM  Result Value Ref Range   Troponin I 0.03 <0.031 ng/mL    Comment:        NO INDICATION OF MYOCARDIAL INJURY.   Blood Culture (routine x 2)     Status: None (Preliminary result)   Collection Time: 01/11/15  3:20 PM  Result Value Ref Range   Specimen Description BLOOD LEFT ARM    Special Requests BOTTLES DRAWN AEROBIC AND ANAEROBIC 10CC    Culture NO GROWTH < 24 HOURS    Report Status PENDING   Blood Culture (routine x 2)     Status: None (Preliminary result)   Collection Time: 01/11/15  3:30 PM  Result Value Ref Range   Specimen Description BLOOD RIGHT HAND    Special Requests BOTTLES DRAWN AEROBIC AND ANAEROBIC 8CC    Culture  Setup Time PENDING    Culture NO GROWTH < 24 HOURS    Report Status PENDING    Urinalysis, Routine w reflex microscopic (not at [facility])     Status: Abnormal   Collection Time: 01/11/15  6:20 PM  Result Value Ref Range   Color, Urine YELLOW YELLOW   APPearance CLEAR CLEAR   Specific Gravity, Urine 1.020 1.005 - 1.030   pH 6.0 5.0 - 8.0   Glucose, UA NEGATIVE NEGATIVE mg/dL   Hgb urine dipstick NEGATIVE NEGATIVE   Bilirubin Urine MODERATE (A) NEGATIVE   Ketones, ur NEGATIVE NEGATIVE mg/dL   Protein, ur TRACE (A) NEGATIVE mg/dL   Nitrite POSITIVE (A) NEGATIVE   Leukocytes, UA TRACE (A) NEGATIVE  Urine microscopic-add on     Status: Abnormal   Collection Time: 01/11/15  6:20 PM  Result Value Ref Range   Squamous Epithelial / LPF 0-5 (A) NONE SEEN    Comment: Please note change in reference range.   WBC, UA 0-5 0 - 5 WBC/hpf    Comment: Please note change in reference range.   RBC / HPF 0-5 0 - 5 RBC/hpf    Comment: Please note change in reference range.   Bacteria, UA MANY (A) NONE SEEN    Comment: Please note change in reference range.  I-Stat CG4 Lactic Acid, ED  (not at  [facility])     Status: Abnormal   Collection Time: 01/11/15  6:36 PM  Result Value Ref Range   Lactic Acid, Venous 2.43 (HH) 0.5 - 2.0 mmol/L   Comment NOTIFIED PHYSICIAN   CBC     Status: Abnormal   Collection Time: 01/12/15  6:38 AM  Result Value Ref Range   WBC 14.5 (H) 4.0 - 10.5 K/uL   RBC 4.07 (L) 4.22 - 5.81 MIL/uL   Hemoglobin 9.9 (L) 13.0 - 17.0 g/dL   HCT 29.4 (L) 39.0 - 52.0 %   MCV 72.2 (L) 78.0 - 100.0 fL   MCH 24.3 (L) 26.0 - 34.0 pg   MCHC 33.7 30.0 - 36.0 g/dL   RDW 19.6 (H) 11.5 - 15.5 %   Platelets 297 150 - 400 K/uL  Comprehensive metabolic panel     Status: Abnormal   Collection Time: 01/12/15  6:38 AM  Result Value Ref Range   Sodium 136 135 - 145 mmol/L   Potassium  4.4 3.5 - 5.1 mmol/L   Chloride 102 101 - 111 mmol/L   CO2 25 22 - 32 mmol/L   Glucose, Bld 92 65 - 99 mg/dL   BUN 11 6 - 20 mg/dL   Creatinine, Ser 0.79 0.61 - 1.24 mg/dL   Calcium 9.5 8.9 - 10.3  mg/dL   Total Protein 5.4 (L) 6.5 - 8.1 g/dL   Albumin 2.3 (L) 3.5 - 5.0 g/dL   AST 196 (H) 15 - 41 U/L   ALT 108 (H) 17 - 63 U/L   Alkaline Phosphatase 259 (H) 38 - 126 U/L   Total Bilirubin 3.8 (H) 0.3 - 1.2 mg/dL   GFR calc non Af Amer >60 >60 mL/min   GFR calc Af Amer >60 >60 mL/min    Comment: (NOTE) The eGFR has been calculated using the CKD EPI equation. This calculation has not been validated in all clinical situations. eGFR's persistently <60 mL/min signify possible Chronic Kidney Disease.    Anion gap 9 5 - 15    ABGS No results for input(s): PHART, PO2ART, TCO2, HCO3 in the last 72 hours.  Invalid input(s): PCO2 CULTURES Recent Results (from the past 240 hour(s))  Blood Culture (routine x 2)     Status: None (Preliminary result)   Collection Time: 01/11/15  3:20 PM  Result Value Ref Range Status   Specimen Description BLOOD LEFT ARM  Final   Special Requests BOTTLES DRAWN AEROBIC AND ANAEROBIC 10CC  Final   Culture NO GROWTH < 24 HOURS  Final   Report Status PENDING  Incomplete  Blood Culture (routine x 2)     Status: None (Preliminary result)   Collection Time: 01/11/15  3:30 PM  Result Value Ref Range Status   Specimen Description BLOOD RIGHT HAND  Final   Special Requests BOTTLES DRAWN AEROBIC AND ANAEROBIC 8CC  Final   Culture  Setup Time PENDING  Incomplete   Culture NO GROWTH < 24 HOURS  Final   Report Status PENDING  Incomplete   Studies/Results: Dg Chest Port 1 View  01/11/2015  CLINICAL DATA:  Fever.  Metastatic lung cancer to the brain. EXAM: PORTABLE CHEST 1 VIEW COMPARISON:  Chest x-rays dated 11/30/2014 and 11/07/2014 FINDINGS: The heart size and pulmonary vascularity are normal. There is no area of consolidation/ atelectasis at the left lung base which is new with air bronchograms. The patient has taken a shallow inspiration, creating slight atelectasis at the right base. There is a right upper lobe mass medially as demonstrated on the prior PET-CT  of 10/21/2014. The patient also has right hilar adenopathy demonstrated on the PET-CT. Calcification in the thoracic aorta.  No acute osseous abnormality. IMPRESSION: New left lower lobe atelectasis and consolidation posterior medially. This could represent pneumonia. Electronically Signed   By: Lorriane Shire M.D.   On: 01/11/2015 15:47    Medications:  Prior to Admission:  Prescriptions prior to admission  Medication Sig Dispense Refill Last Dose  . Diphenhyd-Hydrocort-Nystatin (FIRST-DUKES MOUTHWASH) SUSP Use as directed 5 mLs in the mouth or throat 4 (four) times daily as needed (Swish and swallow for oral thrush). 240 mL 2 01/11/2015  . diphenoxylate-atropine (LOMOTIL) 2.5-0.025 MG tablet Take 1 tablet by mouth 4 (four) times daily as needed for diarrhea or loose stools.   01/10/2015 at Unknown time  . folic acid (FOLVITE) 1 MG tablet Take 1 tablet by mouth daily and for 21 days after the completion of Pemetrexed chemo. 30 tablet 5 12/06/2014  . LORazepam (ATIVAN)  1 MG tablet Take 1 mg by mouth 2 (two) times daily as needed for anxiety.    01/11/2015 at Unknown time  . morphine (MS CONTIN) 15 MG 12 hr tablet Take 15 mg by mouth every 12 (twelve) hours.   01/10/2015 at Unknown time  . omeprazole (PRILOSEC) 20 MG capsule 1 PO 30 MINS PRIOR TO BREAKFAST AND AT BEDTIME 180 capsule 3 01/11/2015 at Unknown time  . ondansetron (ZOFRAN ODT) 4 MG disintegrating tablet Take 1 tablet (4 mg total) by mouth every 6 (six) hours as needed for nausea. (Patient taking differently: Take 4 mg by mouth at bedtime. ) 30 tablet 3 01/10/2015 at Unknown time  . oxyCODONE (OXY IR/ROXICODONE) 5 MG immediate release tablet Take 5 mg by mouth every 6 (six) hours as needed for moderate pain or severe pain.   01/10/2015 at Unknown time  . PEMEtrexed (ALIMTA) 500 MG injection Inject into the vein every 21 ( twenty-one) days. To start 12/06/14   12/06/2014  . Sennosides-Docusate Sodium (SENNA) 8.6-50 MG TABS Take 4 tablets by  mouth 3 (three) times daily as needed (constipation).   01/10/2015 at Unknown time  . traMADol (ULTRAM) 50 MG tablet Take 50-100 mg by mouth every 6 (six) hours as needed for moderate pain.   01/11/2015 at Unknown time  . dexamethasone (DECADRON) 4 MG tablet Start taking 1 tablet (70m) twice daily with food. On Oct 7, continue to taper as instructed. (Patient not taking: Reported on 01/11/2015) 120 tablet 0 Completed Course at Unknown time  . fluconazole (DIFLUCAN) 100 MG tablet Take 1 tablet (100 mg total) by mouth daily. 20 tablet 0   . metoprolol tartrate (LOPRESSOR) 25 MG tablet Take 1 tablet (25 mg total) by mouth 2 (two) times daily. 60 tablet 1 Taking  . sulfamethoxazole-trimethoprim (BACTRIM DS,SEPTRA DS) 800-160 MG tablet Take 1 tablet by mouth 2 (two) times daily. (Patient not taking: Reported on 12/12/2014) 20 tablet 0 Completed Course at Unknown time   Scheduled: . enoxaparin (LOVENOX) injection  40 mg Subcutaneous Q24H  . metoprolol tartrate  25 mg Oral BID  . morphine  15 mg Oral Q12H  . piperacillin-tazobactam (ZOSYN)  IV  3.375 g Intravenous 3 times per day  . vancomycin  1,000 mg Intravenous Q12H   Continuous: . sodium chloride 125 mL/hr at 01/11/15 2156   PTUU:[id number]**OR** acetaminophen, LORazepam, oxyCODONE, senna-docusate  Assesment: He has metastatic non-small cell carcinoma of the lung. He now has healthcare associated pneumonia. He is being treated with appropriate antibiotics and seems to feel a little better. Active Problems:   Non-small cell carcinoma of lung (HCC)   Pneumonia   HCAP (healthcare-associated pneumonia)    Plan: Continue current treatments    LOS: 1 day   Konstantine Gervasi L 01/12/2015, 10:18 AM

## 2015-01-12 NOTE — Progress Notes (Signed)
Patient unable to do his abdominal U/S this am because he ate breakfast this am.  Patient has new order in for NPO after MN.  U/S tech notified me that the test would have to be done in the am.  Late Entry for this am.

## 2015-01-13 ENCOUNTER — Inpatient Hospital Stay (HOSPITAL_COMMUNITY)

## 2015-01-13 NOTE — Care Management Note (Signed)
Case Management Note  Patient Details  Name: Jared Gill MRN: [id number] Date of Birth: 1940-07-14  Subjective/Objective:                  Pt admitted from home with pneumonia. Pt lives with his sister and will return home at discharge. Pt is active with [facility] RN and PT. Pt has a walker and BSC for home use.  Action/Plan: Will arrange resumption of [facility] at discharge.   Expected Discharge Date:  01/13/15               Expected Discharge Plan:  [place]  In-House Referral:  NA  Discharge planning Services  CM Consult  Post Acute Care Choice:  Resumption of Svcs/PTA Provider Choice offered to:  Patient  DME Arranged:    DME Agency:     HH Arranged:  RN, PT [place] Agency:  [place]  Status of Service:  Completed, signed off  Medicare Important Message Given:    Date Medicare IM Given:    Medicare IM give by:    Date Additional Medicare IM Given:    Additional Medicare Important Message give by:     If discussed at [place] of Stay Meetings, dates discussed:    Additional Comments:  Joylene Draft, RN 01/13/2015, 2:32 PM

## 2015-01-13 NOTE — Progress Notes (Signed)
Subjective: Jared Gill was admitted with left lower lobe pneumonia and started on Zosyn and vancomycin. He continues to experience cough. No fever overnight.  Objective: Vital signs in last 24 hours: Filed Vitals:   01/12/15 1959 01/12/15 2028 01/13/15 0258 01/13/15 0552  BP:  108/65  104/67  Pulse:  100  98  Temp:  98.2 F (36.8 C)  97.4 F (36.3 C)  TempSrc:  Oral  Oral  Resp:  21  22  Height:      Weight:      SpO2: 89% 98% 98% 99%   Weight change:   Intake/Output Summary (Last 24 hours) at 01/13/15 0725 Last data filed at 01/13/15 3267  Gross per 24 hour  Intake 3815.42 ml  Output    400 ml  Net 3415.42 ml    Physical Exam: Alert but extremely weak appearing. Pharynx unremarkable. Lungs reveal bilateral rhonchi. Heart regular with no murmurs. Abdomen soft and nontender.  Lab Results:   No results found for this or any previous visit (from the past 24 hour(s)).   ABGS No results for input(s): PHART, PO2ART, TCO2, HCO3 in the last 72 hours.  Invalid input(s): PCO2 CULTURES Recent Results (from the past 240 hour(s))  Blood Culture (routine x 2)     Status: None (Preliminary result)   Collection Time: 01/11/15  3:20 PM  Result Value Ref Range Status   Specimen Description BLOOD LEFT ARM  Final   Special Requests BOTTLES DRAWN AEROBIC AND ANAEROBIC 10CC  Final   Culture NO GROWTH < 24 HOURS  Final   Report Status PENDING  Incomplete  Blood Culture (routine x 2)     Status: None (Preliminary result)   Collection Time: 01/11/15  3:30 PM  Result Value Ref Range Status   Specimen Description BLOOD RIGHT HAND  Final   Special Requests BOTTLES DRAWN AEROBIC AND ANAEROBIC 8CC  Final   Culture  Setup Time PENDING  Incomplete   Culture NO GROWTH < 24 HOURS  Final   Report Status PENDING  Incomplete   Studies/Results: Dg Chest Port 1 View  01/11/2015  CLINICAL DATA:  Fever.  Metastatic lung cancer to the brain. EXAM: PORTABLE CHEST 1 VIEW COMPARISON:  Chest x-rays dated  11/30/2014 and 11/07/2014 FINDINGS: The heart size and pulmonary vascularity are normal. There is no area of consolidation/ atelectasis at the left lung base which is new with air bronchograms. The patient has taken a shallow inspiration, creating slight atelectasis at the right base. There is a right upper lobe mass medially as demonstrated on the prior PET-CT of 10/21/2014. The patient also has right hilar adenopathy demonstrated on the PET-CT. Calcification in the thoracic aorta.  No acute osseous abnormality. IMPRESSION: New left lower lobe atelectasis and consolidation posterior medially. This could represent pneumonia. Electronically Signed   By: Lorriane Shire M.D.   On: 01/11/2015 15:47   Micro Results: Recent Results (from the past 240 hour(s))  Blood Culture (routine x 2)     Status: None (Preliminary result)   Collection Time: 01/11/15  3:20 PM  Result Value Ref Range Status   Specimen Description BLOOD LEFT ARM  Final   Special Requests BOTTLES DRAWN AEROBIC AND ANAEROBIC 10CC  Final   Culture NO GROWTH < 24 HOURS  Final   Report Status PENDING  Incomplete  Blood Culture (routine x 2)     Status: None (Preliminary result)   Collection Time: 01/11/15  3:30 PM  Result Value Ref Range Status   Specimen  Description BLOOD RIGHT HAND  Final   Special Requests BOTTLES DRAWN AEROBIC AND ANAEROBIC 8CC  Final   Culture  Setup Time PENDING  Incomplete   Culture NO GROWTH < 24 HOURS  Final   Report Status PENDING  Incomplete   Studies/Results: Dg Chest Port 1 View  01/11/2015  CLINICAL DATA:  Fever.  Metastatic lung cancer to the brain. EXAM: PORTABLE CHEST 1 VIEW COMPARISON:  Chest x-rays dated 11/30/2014 and 11/07/2014 FINDINGS: The heart size and pulmonary vascularity are normal. There is no area of consolidation/ atelectasis at the left lung base which is new with air bronchograms. The patient has taken a shallow inspiration, creating slight atelectasis at the right base. There is a right  upper lobe mass medially as demonstrated on the prior PET-CT of 10/21/2014. The patient also has right hilar adenopathy demonstrated on the PET-CT. Calcification in the thoracic aorta.  No acute osseous abnormality. IMPRESSION: New left lower lobe atelectasis and consolidation posterior medially. This could represent pneumonia. Electronically Signed   By: Lorriane Shire M.D.   On: 01/11/2015 15:47   Medications:  I have reviewed the patient's current medications Scheduled Meds: . enoxaparin (LOVENOX) injection  40 mg Subcutaneous Q24H  . guaiFENesin  1,200 mg Oral BID  . ipratropium-albuterol  3 mL Nebulization Q4H  . methylPREDNISolone (SOLU-MEDROL) injection  40 mg Intravenous Q12H  . metoprolol tartrate  25 mg Oral BID  . morphine  15 mg Oral Q12H  . piperacillin-tazobactam (ZOSYN)  IV  3.375 g Intravenous 3 times per day  . vancomycin  1,000 mg Intravenous Q12H   Continuous Infusions: . sodium chloride 125 mL/hr at 01/11/15 2156   PRN Meds:.acetaminophen **OR** acetaminophen, LORazepam, oxyCODONE, senna-docusate   Assessment/Plan: #1. Pneumonia. Left lower lobe. Continue Zosyn and vancomycin. #2. Non-small cell lung cancer. #3. Elevated LFTs. Ultrasound pending.  Blood cultures negative so far. Active Problems:   Non-small cell carcinoma of lung ([place])   Pneumonia   HCAP (healthcare-associated pneumonia)     LOS: 2 days   Jared Gill 01/13/2015, 7:25 AM

## 2015-01-14 LAB — VANCOMYCIN, TROUGH: VANCOMYCIN TR: 19 ug/mL (ref 10.0–20.0)

## 2015-01-14 MED ORDER — IPRATROPIUM-ALBUTEROL 0.5-2.5 (3) MG/3ML IN SOLN
3.0000 mL | Freq: Four times a day (QID) | RESPIRATORY_TRACT | Status: DC
  Administered 2015-01-14 – 2015-01-16 (×8): 3 mL via RESPIRATORY_TRACT
  Filled 2015-01-14 (×7): qty 3

## 2015-01-14 NOTE — Progress Notes (Signed)
Patient was resting and alone. Will follow up.

## 2015-01-14 NOTE — Progress Notes (Signed)
[place] for Vancomycin and Zosyn Indication: HCAP  Allergies  Allergen Reactions  . Reglan [Metoclopramide]     ANXIETY, AGITATION, JITTERY    Patient Measurements: Height: '6\' 1"'$  (185.4 cm) Weight: 171 lb 8.3 oz (77.8 kg) IBW/kg (Calculated) : 79.9kg  Vital Signs: Temp: 98.4 F (36.9 C) (11/22 1400) Temp Source: Oral (11/22 1400) BP: 124/67 mmHg (11/22 1400) Pulse Rate: 94 (11/22 1400) Intake/Output from previous day: 11/21 0701 - 11/22 0700 In: 2010.4 [P.O.:460; I.V.:1250.4; IV Piggyback:300] Out: 1400 [Urine:1400] Intake/Output from this shift: Total I/O In: 120 [P.O.:120] Out: -   Labs:  Recent Labs  01/12/15 0638  WBC 14.5*  HGB 9.9*  PLT 297  CREATININE 0.79   Estimated Creatinine Clearance: 89.1 mL/min (by C-G formula based on Cr of 0.79).  Recent Labs  01/14/15 1716  [place] 19     Microbiology: Recent Results (from the past 720 hour(s))  Blood Culture (routine x 2)     Status: None (Preliminary result)   Collection Time: 01/11/15  3:20 PM  Result Value Ref Range Status   Specimen Description BLOOD LEFT ARM  Final   Special Requests BOTTLES DRAWN AEROBIC AND ANAEROBIC 10CC  Final   Culture NO GROWTH 3 DAYS  Final   Report Status PENDING  Incomplete  Blood Culture (routine x 2)     Status: None (Preliminary result)   Collection Time: 01/11/15  3:30 PM  Result Value Ref Range Status   Specimen Description BLOOD RIGHT HAND  Final   Special Requests BOTTLES DRAWN AEROBIC AND ANAEROBIC 8CC  Final   Culture NO GROWTH 3 DAYS  Final   Report Status PENDING  Incomplete  Urine culture     Status: None (Preliminary result)   Collection Time: 01/11/15  6:20 PM  Result Value Ref Range Status   Specimen Description URINE, CATHETERIZED  Final   Special Requests NONE  Final   Culture   Final    >=100,000 COLONIES/mL STAPHYLOCOCCUS SPECIES (COAGULASE NEGATIVE) Performed at [facility]    Report Status  PENDING  Incomplete    Medical History: Past Medical History  Diagnosis Date  . Hypertension   . Abnormal MRI, spine 05/12/2011  . Nausea 01/2014  . BPH (benign prostatic hypertrophy)   . Hx of adenomatous colonic polyps     h/o tubulovillous adenomas  . Diverticulitis   . Lung mass     Right Upper Lobe  . GERD (gastroesophageal reflux disease)   . Blurred vision   . Brain metastasis ([place])   . Brain cancer ([place])   . Lung cancer ([place])   . DNR (do not resuscitate) 12/12/2014    Medications:  Prescriptions prior to admission  Medication Sig Dispense Refill Last Dose  . Diphenhyd-Hydrocort-Nystatin (FIRST-DUKES MOUTHWASH) SUSP Use as directed 5 mLs in the mouth or throat 4 (four) times daily as needed (Swish and swallow for oral thrush). 240 mL 2 01/11/2015  . diphenoxylate-atropine (LOMOTIL) 2.5-0.025 MG tablet Take 1 tablet by mouth 4 (four) times daily as needed for diarrhea or loose stools.   01/10/2015 at Unknown time  . folic acid (FOLVITE) 1 MG tablet Take 1 tablet by mouth daily and for 21 days after the completion of Pemetrexed chemo. 30 tablet 5 12/06/2014  . LORazepam (ATIVAN) 1 MG tablet Take 1 mg by mouth 2 (two) times daily as needed for anxiety.    01/11/2015 at Unknown time  . morphine (MS CONTIN) 15 MG 12 hr tablet  Take 15 mg by mouth every 12 (twelve) hours.   01/10/2015 at Unknown time  . omeprazole (PRILOSEC) 20 MG capsule 1 PO 30 MINS PRIOR TO BREAKFAST AND AT BEDTIME 180 capsule 3 01/11/2015 at Unknown time  . ondansetron (ZOFRAN ODT) 4 MG disintegrating tablet Take 1 tablet (4 mg total) by mouth every 6 (six) hours as needed for nausea. (Patient taking differently: Take 4 mg by mouth at bedtime. ) 30 tablet 3 01/10/2015 at Unknown time  . oxyCODONE (OXY IR/ROXICODONE) 5 MG immediate release tablet Take 5 mg by mouth every 6 (six) hours as needed for moderate pain or severe pain.   01/10/2015 at Unknown time  . PEMEtrexed (ALIMTA) 500 MG injection Inject into the  vein every 21 ( twenty-one) days. To start 12/06/14   12/06/2014  . Sennosides-Docusate Sodium (SENNA) 8.6-50 MG TABS Take 4 tablets by mouth 3 (three) times daily as needed (constipation).   01/10/2015 at Unknown time  . traMADol (ULTRAM) 50 MG tablet Take 50-100 mg by mouth every 6 (six) hours as needed for moderate pain.   01/11/2015 at Unknown time  . dexamethasone (DECADRON) 4 MG tablet Start taking 1 tablet ('4mg'$ ) twice daily with food. On Oct 7, continue to taper as instructed. (Patient not taking: Reported on 01/11/2015) 120 tablet 0 Completed Course at Unknown time  . fluconazole (DIFLUCAN) 100 MG tablet Take 1 tablet (100 mg total) by mouth daily. 20 tablet 0   . metoprolol tartrate (LOPRESSOR) 25 MG tablet Take 1 tablet (25 mg total) by mouth 2 (two) times daily. 60 tablet 1 Taking  . sulfamethoxazole-trimethoprim (BACTRIM DS,SEPTRA DS) 800-160 MG tablet Take 1 tablet by mouth 2 (two) times daily. (Patient not taking: Reported on 12/12/2014) 20 tablet 0 Completed Course at Unknown time   Assessment:  74 yo male brought to the hospital for fatigue for past 1 week. Patient has a history of stage IV non-small cell lung cancer, adenocarcinoma with brain metastasis status post radiation treatment. Patient has been on steroids which were tapered off and stopped on 17th November. Chest xray  Showed left lower pneumonia. Empiric broad spectrum antibiotics with Vancomycin and Zosyn started.  Vanc trough today is therapeutic at 19 mg/L  Goal of Therapy:  Vancomycin trough level 15-20 mcg/ml  Plan:  Cont zosyn 3.375 gm iv q8h, extended dose infusion Cont vancomycin 1gm iv q12h F/u renal function, cultures and clinical course Thanks for allowing pharmacy to be a part of this patient's care.  Excell Seltzer, PharmD Clinical Pharmacist  01/14/2015,6:13 PM

## 2015-01-14 NOTE — Progress Notes (Signed)
Subjective: Cough is improved. No fever. Gen. strength improved.  Objective: Vital signs in last 24 hours: Filed Vitals:   01/13/15 2026 01/13/15 2248 01/14/15 0022 01/14/15 0423  BP:  110/69  122/69  Pulse:  100  98  Temp:  97.8 F (36.6 C)  98.2 F (36.8 C)  TempSrc:  Oral  Oral  Resp:  20  20  Height:      Weight:      SpO2: 92% 94% 93% 94%   Weight change:   Intake/Output Summary (Last 24 hours) at 01/14/15 0725 Last data filed at 01/14/15 0424  Gross per 24 hour  Intake 2010.42 ml  Output   1400 ml  Net 610.42 ml    Physical Exam: Alert. No distress. Lungs reveal decreased rhonchi. Heart regular with no murmurs. Abdomen soft and nontender with no palpable organomegaly. Extremities reveal no edema. Voice remains weak. He is difficult to understand at times.  Lab Results:   No results found for this or any previous visit (from the past 24 hour(s)).   ABGS No results for input(s): PHART, PO2ART, TCO2, HCO3 in the last 72 hours.  Invalid input(s): PCO2 CULTURES Recent Results (from the past 240 hour(s))  Blood Culture (routine x 2)     Status: None (Preliminary result)   Collection Time: 01/11/15  3:20 PM  Result Value Ref Range Status   Specimen Description BLOOD LEFT ARM  Final   Special Requests BOTTLES DRAWN AEROBIC AND ANAEROBIC 10CC  Final   Culture NO GROWTH 2 DAYS  Final   Report Status PENDING  Incomplete  Blood Culture (routine x 2)     Status: None (Preliminary result)   Collection Time: 01/11/15  3:30 PM  Result Value Ref Range Status   Specimen Description BLOOD RIGHT HAND  Final   Special Requests BOTTLES DRAWN AEROBIC AND ANAEROBIC 8CC  Final   Culture NO GROWTH 2 DAYS  Final   Report Status PENDING  Incomplete  Urine culture     Status: None (Preliminary result)   Collection Time: 01/11/15  6:20 PM  Result Value Ref Range Status   Specimen Description URINE, CATHETERIZED  Final   Special Requests NONE  Final   Culture   Final    TOO YOUNG  TO READ Performed at [facility]    Report Status PENDING  Incomplete   Studies/Results: US Abdomen Complete  01/13/2015  CLINICAL DATA:  Elevated liver enzymes, questionable liver lesions on recent CT examination EXAM: ULTRASOUND ABDOMEN COMPLETE COMPARISON:  11/30/2014 FINDINGS: Gallbladder: Surgically removed Common bile duct: Diameter: 3.9 mm. Liver: 2.9 cm echogenic focus is noted in the posterior aspect of the right lobe of the liver. This corresponds to an area seen on the prior exam. The second area is not well appreciated IVC: No abnormality visualized. Pancreas: Visualized portion unremarkable. Spleen: Size and appearance within normal limits. Right Kidney: Length: 10.2 cm. Echogenicity within normal limits. No mass or hydronephrosis visualized. Left Kidney: Length: 9.9 cm. Echogenicity within normal limits. No mass or hydronephrosis visualized. Abdominal aorta: No aneurysm visualized. Other findings: None. IMPRESSION: Hyperechoic focus within the liver which corresponds to that seen on prior exams. Again this is suspicious for metastatic disease Status post cholecystectomy. No other focal abnormality is noted. Electronically Signed   By: Inez Catalina M.D.   On: 01/13/2015 10:40   Micro Results: Recent Results (from the past 240 hour(s))  Blood Culture (routine x 2)     Status: None (Preliminary result)  Collection Time: 01/11/15  3:20 PM  Result Value Ref Range Status   Specimen Description BLOOD LEFT ARM  Final   Special Requests BOTTLES DRAWN AEROBIC AND ANAEROBIC 10CC  Final   Culture NO GROWTH 2 DAYS  Final   Report Status PENDING  Incomplete  Blood Culture (routine x 2)     Status: None (Preliminary result)   Collection Time: 01/11/15  3:30 PM  Result Value Ref Range Status   Specimen Description BLOOD RIGHT HAND  Final   Special Requests BOTTLES DRAWN AEROBIC AND ANAEROBIC 8CC  Final   Culture NO GROWTH 2 DAYS  Final   Report Status PENDING  Incomplete  Urine  culture     Status: None (Preliminary result)   Collection Time: 01/11/15  6:20 PM  Result Value Ref Range Status   Specimen Description URINE, CATHETERIZED  Final   Special Requests NONE  Final   Culture   Final    TOO YOUNG TO READ Performed at [facility]    Report Status PENDING  Incomplete   Studies/Results: US Abdomen Complete  01/13/2015  CLINICAL DATA:  Elevated liver enzymes, questionable liver lesions on recent CT examination EXAM: ULTRASOUND ABDOMEN COMPLETE COMPARISON:  11/30/2014 FINDINGS: Gallbladder: Surgically removed Common bile duct: Diameter: 3.9 mm. Liver: 2.9 cm echogenic focus is noted in the posterior aspect of the right lobe of the liver. This corresponds to an area seen on the prior exam. The second area is not well appreciated IVC: No abnormality visualized. Pancreas: Visualized portion unremarkable. Spleen: Size and appearance within normal limits. Right Kidney: Length: 10.2 cm. Echogenicity within normal limits. No mass or hydronephrosis visualized. Left Kidney: Length: 9.9 cm. Echogenicity within normal limits. No mass or hydronephrosis visualized. Abdominal aorta: No aneurysm visualized. Other findings: None. IMPRESSION: Hyperechoic focus within the liver which corresponds to that seen on prior exams. Again this is suspicious for metastatic disease Status post cholecystectomy. No other focal abnormality is noted. Electronically Signed   By: Inez Catalina M.D.   On: 01/13/2015 10:40   Medications:  I have reviewed the patient's current medications Scheduled Meds: . enoxaparin (LOVENOX) injection  40 mg Subcutaneous Q24H  . guaiFENesin  1,200 mg Oral BID  . ipratropium-albuterol  3 mL Nebulization QID  . metoprolol tartrate  25 mg Oral BID  . morphine  15 mg Oral Q12H  . piperacillin-tazobactam (ZOSYN)  IV  3.375 g Intravenous 3 times per day  . vancomycin  1,000 mg Intravenous Q12H   Continuous Infusions: . sodium chloride 50 mL/hr (01/13/15 1146)    PRN Meds:.acetaminophen **OR** acetaminophen, LORazepam, oxyCODONE, senna-docusate   Assessment/Plan: #1. Pneumonia. Continue vancomycin and Zosyn. Recheck CBC tomorrow. Blood cultures are negative so far. Urine culture is pending. #2. Non-small cell lung carcinoma. Ultrasound of the abdomen is consistent with metastatic disease in the liver. Recheck LFTs. He will have follow-up with oncology as an outpatient. #3. 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 Discontinue Solu-Medrol. Active Problems:   Non-small cell carcinoma of lung ([place])   Pneumonia   HCAP (healthcare-associated pneumonia)     LOS: 3 days   Landen Knoedler 01/14/2015, 7:25 AM

## 2015-01-14 NOTE — Progress Notes (Signed)
PT Cancellation Note  Patient Details Name: Jared Gill MRN: [id number] DOB: 1940-03-09   Cancelled Treatment:    Reason Eval/Treat Not Completed: Fatigue/lethargy limiting ability to participate.  Pt was seen by me 1 month ago and pt appears to have radically declined since then.  He is very lethargic, has difficulty speaking or following any directions and appears to be extremely weak.  I will defer eval until tomorrow when he may be able to participate.   Demetrios Isaacs L  PT 01/14/2015, 12:58 PM [phone]

## 2015-01-15 DIAGNOSIS — Z515 Encounter for palliative care: Secondary | ICD-10-CM | POA: Insufficient documentation

## 2015-01-15 DIAGNOSIS — C3490 Malignant neoplasm of unspecified part of unspecified bronchus or lung: Secondary | ICD-10-CM

## 2015-01-15 DIAGNOSIS — Z7189 Other specified counseling: Secondary | ICD-10-CM | POA: Insufficient documentation

## 2015-01-15 DIAGNOSIS — J189 Pneumonia, unspecified organism: Secondary | ICD-10-CM

## 2015-01-15 LAB — URINE CULTURE: Culture: >=100000

## 2015-01-15 LAB — CBC
HEMATOCRIT: 31.3 % — AB (ref 39.0–52.0)
Hemoglobin: 10.2 g/dL — ABNORMAL LOW (ref 13.0–17.0)
MCH: 23.6 pg — AB (ref 26.0–34.0)
MCHC: 32.6 g/dL (ref 30.0–36.0)
MCV: 72.5 fL — ABNORMAL LOW (ref 78.0–100.0)
Platelets: 375 10*3/uL (ref 150–400)
RBC: 4.32 MIL/uL (ref 4.22–5.81)
RDW: 20.4 % — AB (ref 11.5–15.5)
WBC: 15 10*3/uL — ABNORMAL HIGH (ref 4.0–10.5)

## 2015-01-15 LAB — COMPREHENSIVE METABOLIC PANEL
ALBUMIN: 2.1 g/dL — AB (ref 3.5–5.0)
ALK PHOS: 255 U/L — AB (ref 38–126)
ALT: 133 U/L — ABNORMAL HIGH (ref 17–63)
AST: 266 U/L — AB (ref 15–41)
Anion gap: 9 (ref 5–15)
BILIRUBIN TOTAL: 5.1 mg/dL — AB (ref 0.3–1.2)
BUN: 10 mg/dL (ref 6–20)
CO2: 23 mmol/L (ref 22–32)
Calcium: 9.9 mg/dL (ref 8.9–10.3)
Chloride: 106 mmol/L (ref 101–111)
Creatinine, Ser: 0.68 mg/dL (ref 0.61–1.24)
GFR calc Af Amer: >60 mL/min (ref >60)
GFR calc non Af Amer: >60 mL/min (ref >60)
GLUCOSE: 83 mg/dL (ref 65–99)
POTASSIUM: 4.2 mmol/L (ref 3.5–5.1)
SODIUM: 138 mmol/L (ref 135–145)
TOTAL PROTEIN: 5.2 g/dL — AB (ref 6.5–8.1)

## 2015-01-15 MED ORDER — MORPHINE SULFATE (PF) 4 MG/ML IV SOLN
4.0000 mg | INTRAVENOUS | Status: DC | PRN
  Administered 2015-01-16 – 2015-01-18 (×7): 4 mg via INTRAVENOUS
  Filled 2015-01-15 (×7): qty 1

## 2015-01-15 MED ORDER — ENSURE ENLIVE PO LIQD
237.0000 mL | Freq: Two times a day (BID) | ORAL | Status: DC
  Administered 2015-01-16 – 2015-01-18 (×2): 237 mL via ORAL

## 2015-01-15 NOTE — Progress Notes (Signed)
Initial Nutrition Assessment  DOCUMENTATION CODES:  Not applicable  INTERVENTION:  Ensure Enlive po BID, each supplement provides 350 kcal and 20 grams of protein  If pt does not go strict comfort will f/u and give some education on oncologic nutrition to family.   NUTRITION DIAGNOSIS:  Inadequate oral intake related to chronic illness as evidenced by per patient/family report.  GOAL:  Patient will meet greater than or equal to 90% of their needs  MONITOR:  PO intake, Supplement acceptance, Diet advancement, Labs (Plan of care)  REASON FOR ASSESSMENT:  Low [place]    ASSESSMENT:  74 y/o male PMHx HTN, diverticulitis, GERD, and metastasized lung cancer. Presents with 1 week of fatigue and poor PO intake.   Currently unclear if patient is strict comfort care or not.  Palliative NP to confer with attending and clarify pt's status as pt's medications are still ordered.  Spoke with son at bedside. Question how aware he is of pts status as much of what he stated contradicted what other family members and documentation shows. He reports that pt has not lost any weight. He reports pt is at his normal weight. However, per EMR documentation pt has lost 30 lbs in <6 months.   Son reports that pt lives with a different son. He receives 3 meals a day, but is unsure of exactly how much the pt eats of those meals. He states pt drinks 2 Ensures Each day as well. Pt's home diet is "close to pureed". It is not quite as soft as what he has received in the hospital thus far.   Son reports pt has had no ongoing n/v/c/d. Son reports patient has not had any recent cancer treatments.  Pt appeared very ill looking. was shaking and was non responsive throughout visit. Lunch tray was untouched.   NFPE: not attempted. Brief visual   Diet Order:  DIET - DYS 1 Room service appropriate?: Yes; Fluid consistency:: Thin  Skin:  Reviewed, no issues  Last BM:  11/22-incontinent  Height:  Ht Readings from  Last 1 Encounters:  01/11/15 '6\' 1"'$  (1.854 m)   Weight:  Wt Readings from Last 1 Encounters:  01/11/15 171 lb 8.3 oz (77.8 kg)   Wt Readings from Last 10 Encounters:  01/11/15 171 lb 8.3 oz (77.8 kg)  12/12/14 171 lb 11.2 oz (77.883 kg)  12/10/14 150 lb (68.04 kg)  12/06/14 180 lb (81.647 kg)  12/04/14 230 lb (104.327 kg)  12/04/14 230 lb 13.2 oz (104.7 kg)  11/26/14 175 lb (79.379 kg)  11/25/14 175 lb (79.379 kg)  11/21/14 176 lb 8 oz (80.06 kg)  11/15/14 183 lb 6.4 oz (83.19 kg)   Ideal Body Weight:  83.63 kg  BMI:  Body mass index is 22.63 kg/(m^2).  Estimated Nutritional Needs:  Kcal:  2000-2200 Protein:  94-109 g  Fluid:  2 liters  EDUCATION NEEDS:  No education needs identified at this time  Burtis Junes RD, LDN Nutrition Pager: [phone] 01/15/2015 4:10 PM

## 2015-01-15 NOTE — Consult Note (Signed)
Consultation Note Date: 01/15/2015   Patient Name: ALEKSEI GOODLIN  DOB: Sep 21, 1940  MRN: [id number]  Age / Sex: 74 y.o., male   PCP: Asencion Noble, MD Referring Physician: Asencion Noble, MD  Reason for Consultation: Establishing goals of care and Psychosocial/spiritual support  Palliative Care Assessment and Plan Summary of Established Goals of Care and Medical Treatment Preferences   Clinical Assessment/Narrative: Mr. Wisehart is unable to speak with me at length today.  He tells me that he is having pain, but is unable to tell me more. He requests that I call his brother.   Sterling tells me that he spoke with Dr. Willey Blade this morning ,and although his brother is weak,  They will continue antibiotics to see how he does.  Mr. Sinning has been living with his brother for about 2 weeks, but they have been caring for him for several months now ( 2nd brother, nephews and cousins). We talk about mobility and Birdena Crandall tells me that Mr. Wrightson has "slowed down", and he needs help getting to the bathroom in the last week.    We talk about cancer treatments and Birdena Crandall shares that he was told that cancer treatments wouldn't help, but he wanted a second opinion.  He is now seeing Dr. Lexine Baton oncologist with [facility].  Sterling tells me about the lung and brain cancer and previous radiation therapy.  I ask "what is the goal of the cancer treatments, to cure or to live longer?"  Sterling tells me, "to try and see if they can help him".  "I don't know if they can cure him".  We talk about what "helping" Mr. Gortney would look like, what is important to Mr. Sabado. We talk about side effects of therapy and what they may be, and while Birdena Crandall tells me they discussed these, he is unable to name any.  We talk about the struggles associated with taking cancer treatments, fatigue, nausea, and how will Sterling react if his brother no longer wants treatments.  He tells me that he can support Mr. Lamantia if he decides to stop treatments.    We talk about symptom management if Mr. Snelgrove declines further treatments, and the benefits of Hospice.  Sterling states "he doesn't want to go there" (hospice in [place]), and I share that they can come to his home. He states they had hospice before working with [facility] and he knows he is able to call them in when/if they choose.   Sterling wants to continue to seek treatment for his brothers cancer.  This will not be considered as comfort care. If family were to desire to focus solely on comfort, all IV fluids and antibiotics would be discontinued and referral to hospice placed.  Call to Dr. Luan Pulling (3:30pm) to advise of this case.    Contacts/Participants in Discussion: Primary Decision Maker: Mr. Bohr is unable to make his own decisions and request that I call his brother Birdena Crandall.    HCPOA: no  Sterling states Mr. Novosel has asked him to make choices for him.   Code Status/Advance Care Planning:  DNR  Brother Birdena Crandall states he wants to continue to try chemo therapy with oncologist in [facility] Dr. Lexine Baton.   Symptom Management:   MS Contin SR 15 mg Q 6 hours PRN  Morphine 4 mg IV Q 1 hours PRN  Palliative Prophylaxis: Senna S 2 tabs QD PRN  Psycho-social/Spiritual:   Support System: Lives with brother Birdena Crandall for the last 2 weeks, has another brother, cousins, and  nephews to help.   Desire for further Chaplaincy support:no  Prognosis: Unable to determine, dependant on response to chemotherapy.   Discharge Planning:  Home with Home Health as they want to continue to seek treatments for cancer.        Chief Complaint:  Fatigue History of Present Illness:   74 year old male who  has a past medical history of Hypertension; Abnormal MRI, spine (05/12/2011); Nausea (01/2014); BPH (benign prostatic hypertrophy); adenomatous colonic polyps; Diverticulitis; Lung mass; GERD (gastroesophageal reflux disease); Blurred vision; Brain metastasis ([place]); Brain cancer ([place]); Lung cancer  ([place]); and DNR (do not resuscitate) (12/12/2014). Today was brought to the hospital for fatigue for past 1 week. Patient has a history of stage IV non-small cell lung cancer, adenocarcinoma with brain metastasis status post radiation treatment. Patient has been on steroids which were tapered off and stopped on 17th November. Patient has been on hospice and was taken off hospice to try a new chemotherapy as per Dr. Abran Duke. As per family members patient has not been eating and drinking well over the past 1 week. He also complains of shortness of breath. No chest pain. No nausea vomiting or diarrhea. This morning patient was so fatigued as he was unable to get up from the bed. No fever, no dysuria urgency frequency of urination. In the ED patient was found to have pneumonia and started on antibiotics.  Primary Diagnoses  Present on Admission:  . Pneumonia . HCAP (healthcare-associated pneumonia) . Non-small cell carcinoma of lung ([place])  Palliative Review of Systems: Unable to participate at this time. I have reviewed the medical record, interviewed the patient and family, and examined the patient. The following aspects are pertinent.  Past Medical History  Diagnosis Date  . Hypertension   . Abnormal MRI, spine 05/12/2011  . Nausea 01/2014  . BPH (benign prostatic hypertrophy)   . Hx of adenomatous colonic polyps     h/o tubulovillous adenomas  . Diverticulitis   . Lung mass     Right Upper Lobe  . GERD (gastroesophageal reflux disease)   . Blurred vision   . Brain metastasis ([place])   . Brain cancer ([place])   . Lung cancer ([place])   . DNR (do not resuscitate) 12/12/2014   Social History   Social History  . Marital Status: Single    Spouse Name: N/A  . Number of Children: 0  . Years of Education: N/A   Occupational History  . retired    Social History Main Topics  . Smoking status: Former Smoker -- 0.50 packs/day for 40 years    Types: Cigarettes    Quit date: 10/17/2009  .  Smokeless tobacco: Never Used  . Alcohol Use: No     Comment: Remote EtOH  . Drug Use: No  . Sexual Activity: Not Asked   Other Topics Concern  . None   Social History Narrative   Originally from [place]. Previously worked as a Training and development officer at [employer]. He worked in the [employer]. He previously worked Audiological scientist for [employer] Has no pets currently. No mold exposure. No asbestos exposure.    Family History  Problem Relation Age of Onset  . Diabetes Father   . Diabetes Sister   . Lung cancer Brother   . CVA Brother   . Colon cancer Neg Hx   . CAD Brother   . Heart attack Neg Hx   . Hypertension Brother   . Stroke Brother    Scheduled Meds: . enoxaparin (  LOVENOX) injection  40 mg Subcutaneous Q24H  . guaiFENesin  1,200 mg Oral BID  . ipratropium-albuterol  3 mL Nebulization QID  . metoprolol tartrate  25 mg Oral BID  . morphine  15 mg Oral Q12H  . piperacillin-tazobactam (ZOSYN)  IV  3.375 g Intravenous 3 times per day  . vancomycin  1,000 mg Intravenous Q12H   Continuous Infusions: . sodium chloride 50 mL/hr at 01/14/15 1400   PRN Meds:.acetaminophen **OR** acetaminophen, LORazepam, morphine injection, oxyCODONE, senna-docusate Medications Prior to Admission:  Prior to Admission medications   Medication Sig Start Date End Date Taking? Authorizing Provider  Diphenhyd-Hydrocort-Nystatin (FIRST-DUKES MOUTHWASH) SUSP Use as directed 5 mLs in the mouth or throat 4 (four) times daily as needed (Swish and swallow for oral thrush). 12/12/14  Yes Manon Hilding Kefalas, PA-C  diphenoxylate-atropine (LOMOTIL) 2.5-0.025 MG tablet Take 1 tablet by mouth 4 (four) times daily as needed for diarrhea or loose stools.   Yes Historical Provider, MD  folic acid (FOLVITE) 1 MG tablet Take 1 tablet by mouth daily and for 21 days after the completion of Pemetrexed chemo. 12/02/14  Yes Patrici Ranks, MD  LORazepam (ATIVAN) 1 MG tablet Take 1 mg by mouth 2 (two) times daily as needed  for anxiety.  11/11/14  Yes Historical Provider, MD  morphine (MS CONTIN) 15 MG 12 hr tablet Take 15 mg by mouth every 12 (twelve) hours.   Yes Historical Provider, MD  omeprazole (PRILOSEC) 20 MG capsule 1 PO 30 MINS PRIOR TO BREAKFAST AND AT BEDTIME 08/22/14  Yes Danie Binder, MD  ondansetron (ZOFRAN ODT) 4 MG disintegrating tablet Take 1 tablet (4 mg total) by mouth every 6 (six) hours as needed for nausea. Patient taking differently: Take 4 mg by mouth at bedtime.  05/09/14  Yes Danie Binder, MD  oxyCODONE (OXY IR/ROXICODONE) 5 MG immediate release tablet Take 5 mg by mouth every 6 (six) hours as needed for moderate pain or severe pain.   Yes Historical Provider, MD  PEMEtrexed (ALIMTA) 500 MG injection Inject into the vein every 21 ( twenty-one) days. To start 12/06/14   Yes Historical Provider, MD  Sennosides-Docusate Sodium (SENNA) 8.6-50 MG TABS Take 4 tablets by mouth 3 (three) times daily as needed (constipation).   Yes Historical Provider, MD  traMADol (ULTRAM) 50 MG tablet Take 50-100 mg by mouth every 6 (six) hours as needed for moderate pain.   Yes Historical Provider, MD  dexamethasone (DECADRON) 4 MG tablet Start taking 1 tablet ('4mg'$ ) twice daily with food. On Oct 7, continue to taper as instructed. Patient not taking: Reported on 01/11/2015 11/15/14   Eppie Gibson, MD  fluconazole (DIFLUCAN) 100 MG tablet Take 1 tablet (100 mg total) by mouth daily. 12/12/14   Baird Cancer, PA-C  metoprolol tartrate (LOPRESSOR) 25 MG tablet Take 1 tablet (25 mg total) by mouth 2 (two) times daily. 11/08/14   Coolidge Breeze, PA-C  sulfamethoxazole-trimethoprim (BACTRIM DS,SEPTRA DS) 800-160 MG tablet Take 1 tablet by mouth 2 (two) times daily. Patient not taking: Reported on 12/12/2014 12/05/14   Asencion Noble, MD   Allergies  Allergen Reactions  . Reglan [Metoclopramide]     ANXIETY, AGITATION, JITTERY   CBC:    Component Value Date/Time   WBC 15.0* 01/15/2015 0642   HGB 10.2* 01/15/2015 0642    HCT 31.3* 01/15/2015 0642   PLT 375 01/15/2015 0642   MCV 72.5* 01/15/2015 0642   NEUTROABS 16.2* 01/11/2015 1518   LYMPHSABS 0.5* 01/11/2015  1518   MONOABS 2.0* 01/11/2015 1518   EOSABS 0.0 01/11/2015 1518   BASOSABS 0.0 01/11/2015 1518   Comprehensive Metabolic Panel:    Component Value Date/Time   NA 138 01/15/2015 0642   K 4.2 01/15/2015 0642   CL 106 01/15/2015 0642   CO2 23 01/15/2015 0642   BUN 10 01/15/2015 0642   CREATININE 0.68 01/15/2015 0642   CREATININE 1.00 02/21/2014 1323   GLUCOSE 83 01/15/2015 0642   CALCIUM 9.9 01/15/2015 0642   AST 266* 01/15/2015 0642   ALT 133* 01/15/2015 0642   ALKPHOS 255* 01/15/2015 0642   BILITOT 5.1* 01/15/2015 0642   PROT 5.2* 01/15/2015 0642   ALBUMIN 2.1* 01/15/2015 0642    Physical Exam: Vital Signs: BP 135/72 mmHg  Pulse 88  Temp(Src) 98.3 F (36.8 C) (Axillary)  Resp 18  Ht '6\' 1"'$  (1.854 m)  Wt 77.8 kg (171 lb 8.3 oz)  BMI 22.63 kg/m2  SpO2 92% SpO2: SpO2: 92 % O2 Device: O2 Device: Not Delivered O2 Flow Rate: O2 Flow Rate (L/min): 2 L/min Intake/output summary:  Intake/Output Summary (Last 24 hours) at 01/15/15 1432 Last data filed at 01/15/15 0504  Gross per 24 hour  Intake 1673.34 ml  Output   1600 ml  Net  73.34 ml   LBM: Last BM Date: 01/14/15 Baseline Weight: Weight: 77.9 kg (171 lb 11.8 oz) Most recent weight: Weight: 77.8 kg (171 lb 8.3 oz)  Exam Findings:  Constitutional:  Elderly, frail, chronically ill appearing.  Resp: even, taking breathing treatment at this time. Somewhat SOB.          Palliative Performance Scale: 30%              Additional Data Reviewed: Recent Labs     01/15/15  0642  WBC  15.0*  HGB  10.2*  PLT  375  NA  138  BUN  10  CREATININE  0.68     Time In: 1230 Time Out: 1330 Time Total: 60 minutes Greater than 50%  of this time was spent counseling and coordinating care related to the above assessment and plan.  Signed by: Drue Novel, NP  Drue Novel, NP    01/15/2015, 2:32 PM  Please contact Palliative Medicine Team phone at [phone] for questions and concerns.

## 2015-01-15 NOTE — Progress Notes (Signed)
Subjective: Jared Gill has had a significant change in his condition over the past day. He is not responsive this morning. He did not eat or take his medications last night. Vital signs this morning are normal.  Objective: Vital signs in last 24 hours: Filed Vitals:   01/14/15 1400 01/14/15 1449 01/14/15 2030 01/15/15 0558  BP: 124/67   130/64  Pulse: 94   88  Temp: 98.4 F (36.9 C)   98.3 F (36.8 C)  TempSrc: Oral   Axillary  Resp: 18   18  Height:      Weight:      SpO2: 94% 94% 92% 92%   Weight change:   Intake/Output Summary (Last 24 hours) at 01/15/15 0737 Last data filed at 01/15/15 0504  Gross per 24 hour  Intake 2535.84 ml  Output   1600 ml  Net 935.84 ml    Physical Exam: Unresponsive. Lungs revealed mild rhonchi. Heart regular with no murmurs. Abdomen is soft, nondistended, nontender without hepatosplenomegaly. Extremities reveal no edema.  Lab Results:    Results for orders placed or performed during the hospital encounter of 01/11/15 (from the past 24 hour(s))  Vancomycin, trough     Status: None   Collection Time: 01/14/15  5:16 PM  Result Value Ref Range   Vancomycin Tr 19 10.0 - 20.0 ug/mL  CBC     Status: Abnormal   Collection Time: 01/15/15  6:42 AM  Result Value Ref Range   WBC 15.0 (H) 4.0 - 10.5 K/uL   RBC 4.32 4.22 - 5.81 MIL/uL   Hemoglobin 10.2 (L) 13.0 - 17.0 g/dL   HCT 31.3 (L) 39.0 - 52.0 %   MCV 72.5 (L) 78.0 - 100.0 fL   MCH 23.6 (L) 26.0 - 34.0 pg   MCHC 32.6 30.0 - 36.0 g/dL   RDW 20.4 (H) 11.5 - 15.5 %   Platelets 375 150 - 400 K/uL     ABGS No results for input(s): PHART, PO2ART, TCO2, HCO3 in the last 72 hours.  Invalid input(s): PCO2 CULTURES Recent Results (from the past 240 hour(s))  Blood Culture (routine x 2)     Status: None (Preliminary result)   Collection Time: 01/11/15  3:20 PM  Result Value Ref Range Status   Specimen Description BLOOD LEFT ARM  Final   Special Requests BOTTLES DRAWN AEROBIC AND ANAEROBIC 10CC   Final   Culture NO GROWTH 3 DAYS  Final   Report Status PENDING  Incomplete  Blood Culture (routine x 2)     Status: None (Preliminary result)   Collection Time: 01/11/15  3:30 PM  Result Value Ref Range Status   Specimen Description BLOOD RIGHT HAND  Final   Special Requests BOTTLES DRAWN AEROBIC AND ANAEROBIC 8CC  Final   Culture NO GROWTH 3 DAYS  Final   Report Status PENDING  Incomplete  Urine culture     Status: None (Preliminary result)   Collection Time: 01/11/15  6:20 PM  Result Value Ref Range Status   Specimen Description URINE, CATHETERIZED  Final   Special Requests NONE  Final   Culture   Final    >=100,000 COLONIES/mL STAPHYLOCOCCUS SPECIES (COAGULASE NEGATIVE) Performed at [facility]    Report Status PENDING  Incomplete   Studies/Results: US Abdomen Complete  01/13/2015  CLINICAL DATA:  Elevated liver enzymes, questionable liver lesions on recent CT examination EXAM: ULTRASOUND ABDOMEN COMPLETE COMPARISON:  11/30/2014 FINDINGS: Gallbladder: Surgically removed Common bile duct: Diameter: 3.9 mm. Liver: 2.9 cm echogenic  focus is noted in the posterior aspect of the right lobe of the liver. This corresponds to an area seen on the prior exam. The second area is not well appreciated IVC: No abnormality visualized. Pancreas: Visualized portion unremarkable. Spleen: Size and appearance within normal limits. Right Kidney: Length: 10.2 cm. Echogenicity within normal limits. No mass or hydronephrosis visualized. Left Kidney: Length: 9.9 cm. Echogenicity within normal limits. No mass or hydronephrosis visualized. Abdominal aorta: No aneurysm visualized. Other findings: None. IMPRESSION: Hyperechoic focus within the liver which corresponds to that seen on prior exams. Again this is suspicious for metastatic disease Status post cholecystectomy. No other focal abnormality is noted. Electronically Signed   By: Inez Catalina M.D.   On: 01/13/2015 10:40   Micro Results: Recent Results  (from the past 240 hour(s))  Blood Culture (routine x 2)     Status: None (Preliminary result)   Collection Time: 01/11/15  3:20 PM  Result Value Ref Range Status   Specimen Description BLOOD LEFT ARM  Final   Special Requests BOTTLES DRAWN AEROBIC AND ANAEROBIC 10CC  Final   Culture NO GROWTH 3 DAYS  Final   Report Status PENDING  Incomplete  Blood Culture (routine x 2)     Status: None (Preliminary result)   Collection Time: 01/11/15  3:30 PM  Result Value Ref Range Status   Specimen Description BLOOD RIGHT HAND  Final   Special Requests BOTTLES DRAWN AEROBIC AND ANAEROBIC 8CC  Final   Culture NO GROWTH 3 DAYS  Final   Report Status PENDING  Incomplete  Urine culture     Status: None (Preliminary result)   Collection Time: 01/11/15  6:20 PM  Result Value Ref Range Status   Specimen Description URINE, CATHETERIZED  Final   Special Requests NONE  Final   Culture   Final    >=100,000 COLONIES/mL STAPHYLOCOCCUS SPECIES (COAGULASE NEGATIVE) Performed at [facility]    Report Status PENDING  Incomplete   Studies/Results: US Abdomen Complete  01/13/2015  CLINICAL DATA:  Elevated liver enzymes, questionable liver lesions on recent CT examination EXAM: ULTRASOUND ABDOMEN COMPLETE COMPARISON:  11/30/2014 FINDINGS: Gallbladder: Surgically removed Common bile duct: Diameter: 3.9 mm. Liver: 2.9 cm echogenic focus is noted in the posterior aspect of the right lobe of the liver. This corresponds to an area seen on the prior exam. The second area is not well appreciated IVC: No abnormality visualized. Pancreas: Visualized portion unremarkable. Spleen: Size and appearance within normal limits. Right Kidney: Length: 10.2 cm. Echogenicity within normal limits. No mass or hydronephrosis visualized. Left Kidney: Length: 9.9 cm. Echogenicity within normal limits. No mass or hydronephrosis visualized. Abdominal aorta: No aneurysm visualized. Other findings: None. IMPRESSION: Hyperechoic focus within  the liver which corresponds to that seen on prior exams. Again this is suspicious for metastatic disease Status post cholecystectomy. No other focal abnormality is noted. Electronically Signed   By: Inez Catalina M.D.   On: 01/13/2015 10:40   Medications:  I have reviewed the patient's current medications Scheduled Meds: . enoxaparin (LOVENOX) injection  40 mg Subcutaneous Q24H  . guaiFENesin  1,200 mg Oral BID  . ipratropium-albuterol  3 mL Nebulization QID  . metoprolol tartrate  25 mg Oral BID  . morphine  15 mg Oral Q12H  . piperacillin-tazobactam (ZOSYN)  IV  3.375 g Intravenous 3 times per day  . vancomycin  1,000 mg Intravenous Q12H   Continuous Infusions: . sodium chloride 50 mL/hr at 01/14/15 1400   PRN Meds:.acetaminophen **OR**  acetaminophen, LORazepam, morphine injection, oxyCODONE, senna-docusate   Assessment/Plan: #1. Pneumonia. Continue vancomycin and Zosyn. #2. Coag-negative Staphylococcus UTI. Sensitivities pending. Continue vancomycin for this also. #3. Non-small cell lung carcinoma. Condition deteriorating. CODE STATUS discussed with family who is in agreement with comfort care and a DO NOT RESUSCITATE status. Consult palliative care. Active Problems:   Non-small cell carcinoma of lung ([place])   Pneumonia   HCAP (healthcare-associated pneumonia)     LOS: 4 days   Bert Givans 01/15/2015, 7:37 AM

## 2015-01-15 NOTE — Care Management Note (Signed)
Case Management Note  Patient Details  Name: Jared Gill MRN: [id number] Date of Birth: 1940/07/17  Subjective/Objective:                    Action/Plan:   Expected Discharge Date:  01/13/15               Expected Discharge Plan:  [place]  In-House Referral:  NA  Discharge planning Services  CM Consult  Post Acute Care Choice:  Resumption of Svcs/PTA Provider Choice offered to:  Patient  DME Arranged:    DME Agency:     HH Arranged:  RN, PT [place] Agency:  [place]  Status of Service:  Completed, signed off  Medicare Important Message Given:    Date Medicare IM Given:    Medicare IM give by:    Date Additional Medicare IM Given:    Additional Medicare Important Message give by:     If discussed at [place] of Stay Meetings, dates discussed:    Additional Comments: Pt now unresponsive. Palliative care consult. ? Candidate for inpatient hospice services. Christinia Gully [place], RN 01/15/2015, 8:12 AM

## 2015-01-15 NOTE — Care Management Important Message (Signed)
Important Message  Patient Details  Name: Jared Gill MRN: [id number] Date of Birth: October 17, 1940   Medicare Important Message Given:  Yes    Joylene Draft, RN 01/15/2015, 3:59 PM

## 2015-01-15 NOTE — Progress Notes (Signed)
PT Cancellation Note  Patient Details Name: Jared Gill MRN: [id number] DOB: 14-Mar-1940   Cancelled Treatment:    Reason Eval/Treat Not Completed: Patient's level of consciousness.  Pt continues to be poorly responsive and ready for Hospice.  He is clearly not appropriate for PT.  We will d/c orders.  If the situation changes, please reconsult.   Demetrios Isaacs L  PT 01/15/2015, 10:26 AM [phone]

## 2015-01-15 NOTE — Care Management Note (Signed)
Case Management Note  Patient Details  Name: Jared Gill MRN: [id number] Date of Birth: 1940-03-01  Subjective/Objective:                    Action/Plan:   Expected Discharge Date:  01/13/15               Expected Discharge Plan:  [place]  In-House Referral:  NA  Discharge planning Services  CM Consult  Post Acute Care Choice:  Resumption of Svcs/PTA Provider Choice offered to:  Patient  DME Arranged:    DME Agency:     HH Arranged:  RN, PT, OT, Nurse's Aide [place] Agency:  [place]  Status of Service:  Completed, signed off  Medicare Important Message Given:  Yes Date Medicare IM Given:    Medicare IM give by:    Date Additional Medicare IM Given:    Additional Medicare Important Message give by:     If discussed at [place] of Stay Meetings, dates discussed:    Additional Comments: Family wishes to continue with active cancer treatment. Family refusing any hospice services at this time. Pts home health is [facility] with [facility] and can be resumed at discharge. Christinia Gully [place], RN 01/15/2015, 3:59 PM

## 2015-01-16 MED ORDER — IPRATROPIUM-ALBUTEROL 0.5-2.5 (3) MG/3ML IN SOLN
3.0000 mL | Freq: Three times a day (TID) | RESPIRATORY_TRACT | Status: DC
  Administered 2015-01-16 – 2015-01-18 (×6): 3 mL via RESPIRATORY_TRACT
  Filled 2015-01-16 (×6): qty 3

## 2015-01-16 NOTE — Progress Notes (Signed)
Pharmacy Antibiotic Time-Out Note  Jared Gill is a 74 y.o. year-old male admitted on 01/11/2015.  The patient is currently on Vancomycin and Zosyn for HCAP.  Pt is currently afebrile.   Recommendation:  Consider d/c Vancomycin and Zosyn and switch (narrow) to Doxycycline '100mg'$  IV q12hrs (could also consider Clindamycin and Ciprofloxacin if preferred).    Assessment: 74 yo male brought to the hospital for fatigue for past 1 week. Patient has a history of stage IV non-small cell lung cancer, adenocarcinoma with brain metastasis status post radiation treatment. Patient has been on steroids which were tapered off and stopped on 17th November. Chest xrayshowed left lower lobe pneumonia. Empiric broad spectrum antibiotics with Vancomycin and Zosyn were started.  Blood cx with no growth x 4 days, Urine culture growing CONS and sensitivities reported:  Culture & Susceptibility    STAPHYLOCOCCUS SPECIES (COAGULASE NEGATIVE)    Antibiotic Sensitivity Microscan Status   CIPROFLOXACIN Sensitive <=0.5 SENSITIVE Final   Method: MIC   CLINDAMYCIN Sensitive <=0.25 SENSITIVE Final   Method: MIC   GENTAMICIN Sensitive <=0.5 SENSITIVE Final   Method: MIC   Inducible Clindamycin Sensitive NEGATIVE Final   Method: MIC   NITROFURANTOIN Sensitive <=16 SENSITIVE Final   Method: MIC   OXACILLIN Sensitive <=0.25 SENSITIVE Final   Method: MIC   RIFAMPIN Sensitive <=0.5 SENSITIVE Final   Method: MIC   TETRACYCLINE Sensitive <=1 SENSITIVE Final   Method: MIC   TRIMETH/SULFA Sensitive <=10 SENSITIVE Final   Method: MIC   VANCOMYCIN Sensitive 1 SENSITIVE Final   Method: MIC   Comments STAPHYLOCOCCUS SPECIES (COAGULASE NEGATIVE) (MIC)   >=100,000 COLONIES/mL STAPHYLOCOCCUS SPECIES (COAGULASE NEGATIVE)           Temp (24hrs), Avg:98.1 F (36.7 C), Min:97.8 F (36.6 C), Max:98.5 F (36.9 C)   Recent Labs Lab 01/11/15 1518 01/12/15 0638 01/15/15 0642  WBC 18.7* 14.5* 15.0*     Recent Labs Lab 01/11/15 1518 01/12/15 0638 01/15/15 0642  CREATININE 1.19 0.79 0.68   Estimated Creatinine Clearance: 89.1 mL/min (by C-G formula based on Cr of 0.68).   Antimicrobial allergies: NONE  Antimicrobials this admission: Zosyn 11/19 >>  Vancomycin 11/19 >>   Levels/dose changes this admission: Vancomycin trough level = 19 on 11/22  Microbiology Results: 11/19 BCx: no growth x 4 days (pending) 11/19 UCx: > 100,000 colonies CONS (sensitivities as above)   Thank you for allowing pharmacy to be a part of this patient's care.  Hart Robinsons A PharmD 01/16/2015 10:31 AM

## 2015-01-16 NOTE — Progress Notes (Signed)
Subjective: Jared Gill is alert today with his eyes open. He tries to speak but it is difficult to understand him. His voice is very low. He appears quite weak. Appreciate palliative care consult. Family appears to continue to hold out hope that his new oncologist may be able to help him.  Objective: Vital signs in last 24 hours: Filed Vitals:   01/15/15 2102 01/15/15 2158 01/16/15 0613 01/16/15 0714  BP:  117/61 117/65   Pulse:  107 107   Temp:  98.5 F (36.9 C) 98.1 F (36.7 C)   TempSrc:  Oral Oral   Resp:  20 20   Height:      Weight:      SpO2: 98% 94% 96% 96%   Weight change:   Intake/Output Summary (Last 24 hours) at 01/16/15 1121 Last data filed at 01/16/15 0837  Gross per 24 hour  Intake 1466.67 ml  Output    425 ml  Net 1041.67 ml    Physical Exam: No distress. Pharynx dry. Lungs reveal mild rhonchi. Heart tachycardic with no murmurs. Abdomen nontender with no organomegaly. Extremities reveal 1+ edema in the feet but no edema in the legs. Neurologic status improved today with resolution of his unresponsiveness.  Lab Results:   No results found for this or any previous visit (from the past 24 hour(s)).   ABGS No results for input(s): PHART, PO2ART, TCO2, HCO3 in the last 72 hours.  Invalid input(s): PCO2 CULTURES Recent Results (from the past 240 hour(s))  Blood Culture (routine x 2)     Status: None (Preliminary result)   Collection Time: 01/11/15  3:20 PM  Result Value Ref Range Status   Specimen Description BLOOD LEFT ARM  Final   Special Requests BOTTLES DRAWN AEROBIC AND ANAEROBIC 10CC  Final   Culture NO GROWTH 4 DAYS  Final   Report Status PENDING  Incomplete  Blood Culture (routine x 2)     Status: None (Preliminary result)   Collection Time: 01/11/15  3:30 PM  Result Value Ref Range Status   Specimen Description BLOOD RIGHT HAND  Final   Special Requests BOTTLES DRAWN AEROBIC AND ANAEROBIC 8CC  Final   Culture NO GROWTH 4 DAYS  Final   Report  Status PENDING  Incomplete  Urine culture     Status: None   Collection Time: 01/11/15  6:20 PM  Result Value Ref Range Status   Specimen Description URINE, CATHETERIZED  Final   Special Requests NONE  Final   Culture   Final    >=100,000 COLONIES/mL STAPHYLOCOCCUS SPECIES (COAGULASE NEGATIVE) Performed at [facility]    Report Status 01/15/2015 FINAL  Final   Organism ID, Bacteria STAPHYLOCOCCUS SPECIES (COAGULASE NEGATIVE)  Final      Susceptibility   Staphylococcus species (coagulase negative) - MIC*    CIPROFLOXACIN <=0.5 SENSITIVE Sensitive     GENTAMICIN <=0.5 SENSITIVE Sensitive     NITROFURANTOIN <=16 SENSITIVE Sensitive     OXACILLIN <=0.25 SENSITIVE Sensitive     TETRACYCLINE <=1 SENSITIVE Sensitive     VANCOMYCIN 1 SENSITIVE Sensitive     TRIMETH/SULFA <=10 SENSITIVE Sensitive     CLINDAMYCIN <=0.25 SENSITIVE Sensitive     RIFAMPIN <=0.5 SENSITIVE Sensitive     Inducible Clindamycin NEGATIVE Sensitive     * >=100,000 COLONIES/mL STAPHYLOCOCCUS SPECIES (COAGULASE NEGATIVE)   Studies/Results: No results found. Micro Results: Recent Results (from the past 240 hour(s))  Blood Culture (routine x 2)     Status: None (Preliminary result)  Collection Time: 01/11/15  3:20 PM  Result Value Ref Range Status   Specimen Description BLOOD LEFT ARM  Final   Special Requests BOTTLES DRAWN AEROBIC AND ANAEROBIC 10CC  Final   Culture NO GROWTH 4 DAYS  Final   Report Status PENDING  Incomplete  Blood Culture (routine x 2)     Status: None (Preliminary result)   Collection Time: 01/11/15  3:30 PM  Result Value Ref Range Status   Specimen Description BLOOD RIGHT HAND  Final   Special Requests BOTTLES DRAWN AEROBIC AND ANAEROBIC 8CC  Final   Culture NO GROWTH 4 DAYS  Final   Report Status PENDING  Incomplete  Urine culture     Status: None   Collection Time: 01/11/15  6:20 PM  Result Value Ref Range Status   Specimen Description URINE, CATHETERIZED  Final   Special  Requests NONE  Final   Culture   Final    >=100,000 COLONIES/mL STAPHYLOCOCCUS SPECIES (COAGULASE NEGATIVE) Performed at [facility]    Report Status 01/15/2015 FINAL  Final   Organism ID, Bacteria STAPHYLOCOCCUS SPECIES (COAGULASE NEGATIVE)  Final      Susceptibility   Staphylococcus species (coagulase negative) - MIC*    CIPROFLOXACIN <=0.5 SENSITIVE Sensitive     GENTAMICIN <=0.5 SENSITIVE Sensitive     NITROFURANTOIN <=16 SENSITIVE Sensitive     OXACILLIN <=0.25 SENSITIVE Sensitive     TETRACYCLINE <=1 SENSITIVE Sensitive     VANCOMYCIN 1 SENSITIVE Sensitive     TRIMETH/SULFA <=10 SENSITIVE Sensitive     CLINDAMYCIN <=0.25 SENSITIVE Sensitive     RIFAMPIN <=0.5 SENSITIVE Sensitive     Inducible Clindamycin NEGATIVE Sensitive     * >=100,000 COLONIES/mL STAPHYLOCOCCUS SPECIES (COAGULASE NEGATIVE)   Studies/Results: No results found. Medications:  I have reviewed the patient's current medications Scheduled Meds: . enoxaparin (LOVENOX) injection  40 mg Subcutaneous Q24H  . feeding supplement (ENSURE ENLIVE)  237 mL Oral BID BM  . guaiFENesin  1,200 mg Oral BID  . ipratropium-albuterol  3 mL Nebulization TID  . metoprolol tartrate  25 mg Oral BID  . morphine  15 mg Oral Q12H  . piperacillin-tazobactam (ZOSYN)  IV  3.375 g Intravenous 3 times per day  . vancomycin  1,000 mg Intravenous Q12H   Continuous Infusions: . sodium chloride 50 mL/hr at 01/15/15 1628   PRN Meds:.acetaminophen **OR** acetaminophen, LORazepam, morphine injection, oxyCODONE, senna-docusate   Assessment/Plan: #1. Healthcare associated pneumonia. We'll continue vancomycin and Zosyn for another day prior to making any possible change. Pharmacy recommendations noted. #2. Coag-negative staph UTI. Continue vancomycin. #3. Non-small cell carcinoma of the lung. 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 Active Problems:   Non-small cell carcinoma of lung ([place])   Pneumonia   HCAP (healthcare-associated  pneumonia)   Palliative care encounter   DNR (do not resuscitate) discussion     LOS: 5 days   Elizbeth Posa 01/16/2015, 11:21 AM

## 2015-01-17 LAB — CULTURE, BLOOD (ROUTINE X 2)
CULTURE: NO GROWTH
Culture: NO GROWTH

## 2015-01-17 MED ORDER — DOXYCYCLINE HYCLATE 100 MG IV SOLR
100.0000 mg | Freq: Two times a day (BID) | INTRAVENOUS | Status: DC
  Administered 2015-01-17 – 2015-01-18 (×3): 100 mg via INTRAVENOUS
  Filled 2015-01-17 (×7): qty 100

## 2015-01-17 NOTE — Care Management Important Message (Signed)
Important Message  Patient Details  Name: Jared Gill MRN: [id number] Date of Birth: [birth year]   Medicare Important Message Given:  Yes    Joylene Draft, RN 01/17/2015, 8:34 AM

## 2015-01-17 NOTE — Care Management Important Message (Signed)
Important Message  Patient Details  Name: Jared Gill MRN: [id number] Date of Birth: Jan 22, 1941   Medicare Important Message Given:  Yes    Joylene Draft, RN 01/17/2015, 1:07 PM

## 2015-01-17 NOTE — Progress Notes (Signed)
Subjective: Jared Gill is weak and minimally responsive this morning. He will open his eyes but is not speaking. He is accompanied by his family. We had further discussion regarding his palliative care consult.  Objective: Vital signs in last 24 hours: Filed Vitals:   01/16/15 2040 01/16/15 2101 01/17/15 0458 01/17/15 0722  BP: 119/56  108/57   Pulse: 116  107   Temp: 99.2 F (37.3 C)     TempSrc: Oral  Oral   Resp: 20  22   Height:      Weight:      SpO2: 91% 94% 93% 95%   Weight change:   Intake/Output Summary (Last 24 hours) at 01/17/15 0754 Last data filed at 01/17/15 0506  Gross per 24 hour  Intake   2275 ml  Output      0 ml  Net   2275 ml    Physical Exam: Weak appearing. Intermittently grimacing. Pharynx dry. Lungs reveal bilateral rhonchi. Heart tachycardic with no murmurs. Abdomen nontender with no hepatosplenomegaly.  Lab Results:   No results found for this or any previous visit (from the past 24 hour(s)).   ABGS No results for input(s): PHART, PO2ART, TCO2, HCO3 in the last 72 hours.  Invalid input(s): PCO2 CULTURES Recent Results (from the past 240 hour(s))  Blood Culture (routine x 2)     Status: None   Collection Time: 01/11/15  3:20 PM  Result Value Ref Range Status   Specimen Description BLOOD LEFT ARM  Final   Special Requests BOTTLES DRAWN AEROBIC AND ANAEROBIC 10CC  Final   Culture NO GROWTH 6 DAYS  Final   Report Status 01/17/2015 FINAL  Final  Blood Culture (routine x 2)     Status: None   Collection Time: 01/11/15  3:30 PM  Result Value Ref Range Status   Specimen Description BLOOD RIGHT HAND  Final   Special Requests BOTTLES DRAWN AEROBIC AND ANAEROBIC 8CC  Final   Culture NO GROWTH 6 DAYS  Final   Report Status 01/17/2015 FINAL  Final  Urine culture     Status: None   Collection Time: 01/11/15  6:20 PM  Result Value Ref Range Status   Specimen Description URINE, CATHETERIZED  Final   Special Requests NONE  Final   Culture   Final   >=100,000 COLONIES/mL STAPHYLOCOCCUS SPECIES (COAGULASE NEGATIVE) Performed at [facility]    Report Status 01/15/2015 FINAL  Final   Organism ID, Bacteria STAPHYLOCOCCUS SPECIES (COAGULASE NEGATIVE)  Final      Susceptibility   Staphylococcus species (coagulase negative) - MIC*    CIPROFLOXACIN <=0.5 SENSITIVE Sensitive     GENTAMICIN <=0.5 SENSITIVE Sensitive     NITROFURANTOIN <=16 SENSITIVE Sensitive     OXACILLIN <=0.25 SENSITIVE Sensitive     TETRACYCLINE <=1 SENSITIVE Sensitive     VANCOMYCIN 1 SENSITIVE Sensitive     TRIMETH/SULFA <=10 SENSITIVE Sensitive     CLINDAMYCIN <=0.25 SENSITIVE Sensitive     RIFAMPIN <=0.5 SENSITIVE Sensitive     Inducible Clindamycin NEGATIVE Sensitive     * >=100,000 COLONIES/mL STAPHYLOCOCCUS SPECIES (COAGULASE NEGATIVE)   Studies/Results: No results found. Micro Results: Recent Results (from the past 240 hour(s))  Blood Culture (routine x 2)     Status: None   Collection Time: 01/11/15  3:20 PM  Result Value Ref Range Status   Specimen Description BLOOD LEFT ARM  Final   Special Requests BOTTLES DRAWN AEROBIC AND ANAEROBIC 10CC  Final   Culture NO GROWTH 6 DAYS  Final   Report Status 01/17/2015 FINAL  Final  Blood Culture (routine x 2)     Status: None   Collection Time: 01/11/15  3:30 PM  Result Value Ref Range Status   Specimen Description BLOOD RIGHT HAND  Final   Special Requests BOTTLES DRAWN AEROBIC AND ANAEROBIC 8CC  Final   Culture NO GROWTH 6 DAYS  Final   Report Status 01/17/2015 FINAL  Final  Urine culture     Status: None   Collection Time: 01/11/15  6:20 PM  Result Value Ref Range Status   Specimen Description URINE, CATHETERIZED  Final   Special Requests NONE  Final   Culture   Final    >=100,000 COLONIES/mL STAPHYLOCOCCUS SPECIES (COAGULASE NEGATIVE) Performed at [facility]    Report Status 01/15/2015 FINAL  Final   Organism ID, Bacteria STAPHYLOCOCCUS SPECIES (COAGULASE NEGATIVE)  Final       Susceptibility   Staphylococcus species (coagulase negative) - MIC*    CIPROFLOXACIN <=0.5 SENSITIVE Sensitive     GENTAMICIN <=0.5 SENSITIVE Sensitive     NITROFURANTOIN <=16 SENSITIVE Sensitive     OXACILLIN <=0.25 SENSITIVE Sensitive     TETRACYCLINE <=1 SENSITIVE Sensitive     VANCOMYCIN 1 SENSITIVE Sensitive     TRIMETH/SULFA <=10 SENSITIVE Sensitive     CLINDAMYCIN <=0.25 SENSITIVE Sensitive     RIFAMPIN <=0.5 SENSITIVE Sensitive     Inducible Clindamycin NEGATIVE Sensitive     * >=100,000 COLONIES/mL STAPHYLOCOCCUS SPECIES (COAGULASE NEGATIVE)   Studies/Results: No results found. Medications:  I have reviewed the patient's current medications Scheduled Meds: . doxycycline (VIBRAMYCIN) IV  100 mg Intravenous Q12H  . enoxaparin (LOVENOX) injection  40 mg Subcutaneous Q24H  . feeding supplement (ENSURE ENLIVE)  237 mL Oral BID BM  . guaiFENesin  1,200 mg Oral BID  . ipratropium-albuterol  3 mL Nebulization TID  . metoprolol tartrate  25 mg Oral BID  . morphine  15 mg Oral Q12H   Continuous Infusions: . sodium chloride 50 mL/hr at 01/16/15 1501   PRN Meds:.acetaminophen **OR** acetaminophen, LORazepam, morphine injection, oxyCODONE, senna-docusate   Assessment/Plan: #1. Non-small cell carcinoma of the lung. Hospice care recommended. Family is now agreeable to this and is interested in taking him home and not to the hospice home. #2. Pneumonia. Modify vancomycin then Azactam to doxycycline. #3. Coag-negative staph UTI. Blood cultures are negative. Active Problems:   Non-small cell carcinoma of lung ([place])   Pneumonia   HCAP (healthcare-associated pneumonia)   Palliative care encounter   DNR (do not resuscitate) discussion     LOS: 6 days   Jaionna Weisse 01/17/2015, 7:54 AM

## 2015-01-17 NOTE — Care Management Note (Signed)
Case Management Note  Patient Details  Name: Jared Gill MRN: [id number] Date of Birth: Jul 18, 1940  Subjective/Objective:                    Action/Plan:   Expected Discharge Date:  01/13/15               Expected Discharge Plan:  Home w Hospice Care  In-House Referral:  Hospice / Palliative Care  Discharge planning Services  CM Consult  Post Acute Care Choice:  Hospice Choice offered to:  Sibling  DME Arranged:    DME Agency:     HH Arranged:    [place] Agency:     Status of Service:  Completed, signed off  Medicare Important Message Given:  Yes Date Medicare IM Given:    Medicare IM give by:    Date Additional Medicare IM Given:    Additional Medicare Important Message give by:     If discussed at [place] of Stay Meetings, dates discussed:    Additional Comments: Anticipate discharge on 01/17/15 to home with [facility] (per pts brother request). Referral called to [facility] who will admit pt at discharge. Hospice will also arrange home O2 and hospital bed to be delivered prior to discharge. Weekend nurse to call on call hospice nurse at [phone] at discharge.  Christinia Gully [place], RN 01/17/2015, 1:08 PM

## 2015-01-18 LAB — CREATININE, SERUM
CREATININE: 0.63 mg/dL (ref 0.61–1.24)
GFR calc Af Amer: >60 mL/min (ref >60)

## 2015-01-18 MED ORDER — DOXYCYCLINE HYCLATE 100 MG PO CAPS: 100.0000 mg | ORAL_CAPSULE | Freq: Two times a day (BID) | ORAL | Status: AC

## 2015-01-18 NOTE — Progress Notes (Signed)
Pt's IV catheter removed and intact. Pt's IV site clean dry and intact. Per brother pt's discharge instructions to be given to [facility] ([place]). Report given to [place], [facility]. Discharge instructions including medications were discussed with Amy. All questions were answered and no further questions at this time. Pt in stable condition at time of discharge and in no acute distress. Pt transported via [facility].

## 2015-01-18 NOTE — Progress Notes (Signed)
[facility] arrived to transport patient to pt's brothers home.

## 2015-01-18 NOTE — Discharge Summary (Signed)
Physician Discharge Summary  Jared Gill FIE:[id number] DOB: [birth year] DOA: 01/11/2015   Admit date: 01/11/2015 Discharge date: 01/18/2015  Discharge Diagnoses:  Active Problems:   Non-small cell carcinoma of lung ([facility])   Pneumonia   HCAP (healthcare-associated pneumonia)   Palliative care encounter   DNR (do not resuscitate) discussion    Wt Readings from Last 3 Encounters:  01/11/15 171 lb 8.3 oz (77.8 kg)  12/12/14 171 lb 11.2 oz (77.883 kg)  12/10/14 150 lb (68.04 kg)     Hospital Course: Jared Gill is a 74 year old male who presented with cough , weakness and diminished responsiveness. He has underlying non-small cell carcinoma of the lung. Chest x-ray revealed a new pneumonia. He was treated with vancomycin and Zosyn. Abnormal liver function studies were evaluated with an abdominal ultrasound. He is felt to likely have metastatic disease to the liver. He has previously had metastatic disease to the brain treated with radiation therapy. His initial weakness temporarily improved however he subsequently had periods of unresponsiveness. He was seen in consultation by palliative care. The family now consents to a DO NOT RESUSCITATE status. Hospice was reconsulted. Arrangements have been made for hospice care at home.  His urine culture revealed a coag-negative staph. Antibiotics were modified to doxycycline. He will be continued on doxycycline orally twice a day if he is able to swallow it. He has been eating very little. He is not febrile now. His general condition has significantly weakened over time. He would not appear to be a candidate for any further chemotherapy.  Morphine and oxycodone will be continued. On the day of discharge he is more alert but is not able to speak or communicate effectively at this point. He is too weak to stand or walk on his own now. His lungs reveal mild rhonchi. Heart is regular at 104 bpm. Abdomen soft and nontender with no palpable  organomegaly.  Prognosis at this point is quite poor. Further care per hospice.  Discharge Instructions     Medication List    STOP taking these medications        dexamethasone 4 MG tablet  Commonly known as:  DECADRON     diphenoxylate-atropine 2.5-0.025 MG tablet  Commonly known as:  LOMOTIL     FIRST-DUKES MOUTHWASH Susp     fluconazole 100 MG tablet  Commonly known as:  DIFLUCAN     folic acid 1 MG tablet  Commonly known as:  FOLVITE     LORazepam 1 MG tablet  Commonly known as:  ATIVAN     ondansetron 4 MG disintegrating tablet  Commonly known as:  ZOFRAN ODT     PEMEtrexed 500 MG injection  Commonly known as:  ALIMTA     Senna 8.6-50 MG Tabs     sulfamethoxazole-trimethoprim 800-160 MG tablet  Commonly known as:  BACTRIM DS,SEPTRA DS     traMADol 50 MG tablet  Commonly known as:  ULTRAM      TAKE these medications        doxycycline 100 MG capsule  Commonly known as:  VIBRAMYCIN  Take 1 capsule (100 mg total) by mouth 2 (two) times daily.     metoprolol tartrate 25 MG tablet  Commonly known as:  LOPRESSOR  Take 1 tablet (25 mg total) by mouth 2 (two) times daily.     morphine 15 MG 12 hr tablet  Commonly known as:  MS CONTIN  Take 15 mg by mouth every 12 (twelve) hours.     omeprazole 20  MG capsule  Commonly known as:  PRILOSEC  1 PO 30 MINS PRIOR TO BREAKFAST AND AT BEDTIME     oxyCODONE 5 MG immediate release tablet  Commonly known as:  Oxy IR/ROXICODONE  Take 5 mg by mouth every 6 (six) hours as needed for moderate pain or severe pain.         Jared Gill 01/18/2015

## 2015-01-20 ENCOUNTER — Ambulatory Visit (HOSPITAL_COMMUNITY): Payer: Self-pay | Admitting: Hematology & Oncology

## 2015-01-20 ENCOUNTER — Inpatient Hospital Stay (HOSPITAL_COMMUNITY): Payer: Self-pay

## 2015-01-23 DEATH — deceased

## 2015-02-10 ENCOUNTER — Inpatient Hospital Stay (HOSPITAL_COMMUNITY): Payer: Self-pay

## 2015-02-10 ENCOUNTER — Ambulatory Visit (HOSPITAL_COMMUNITY): Payer: Self-pay | Admitting: Oncology

## 2016-01-18 IMAGING — CT CT HEAD W/O CM
3 of 4 series · 14 of 37 positions shown, 15 images · non-contrast
Comparison: None.

CLINICAL DATA: Syncope and fall off toilet this morning. Positive
loss of consciousness.

EXAM:
CT HEAD WITHOUT CONTRAST
CT CERVICAL SPINE WITHOUT CONTRAST
TECHNIQUE: Multidetector CT imaging of the head and cervical spine was
performed following the standard protocol without intravenous
contrast. Multiplanar CT image reconstructions of the cervical spine
were also generated.

[Series 2: headseq 4.8 h37s · axial · 0.47mm/px · z∈[+306,+393]mm · 3 of 36 slices shown, 4 images]
[im 9/36  brain]
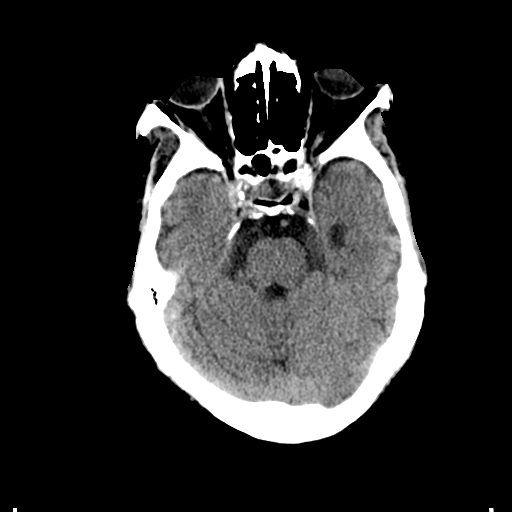
[im 9/36  bone]
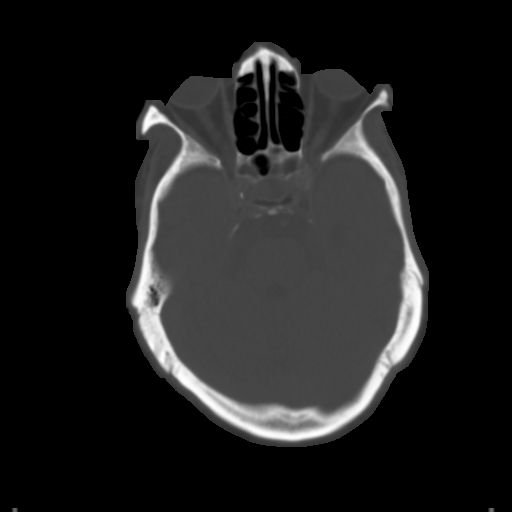
[im 18/36  brain]
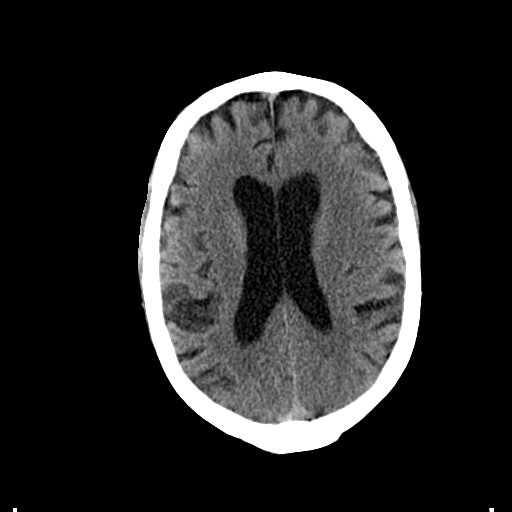
[im 27/36  brain]
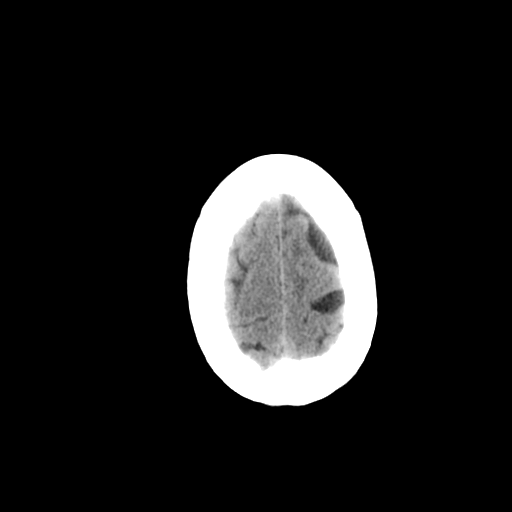

[Series 7: sagittal bone 2.0 · sagittal · 0.21mm/px · 3 of 60 slices shown]
[im 20/60  brain]
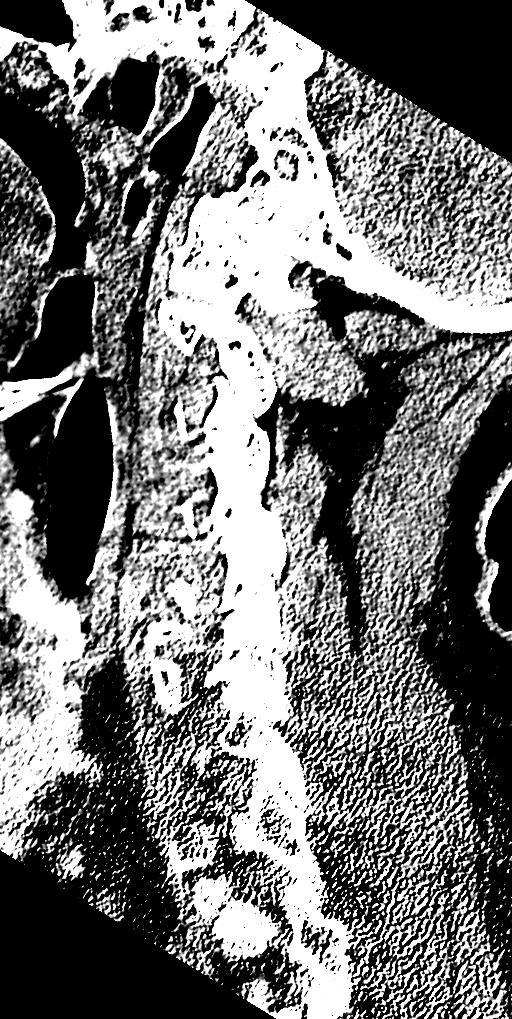
[im 30/60  brain]
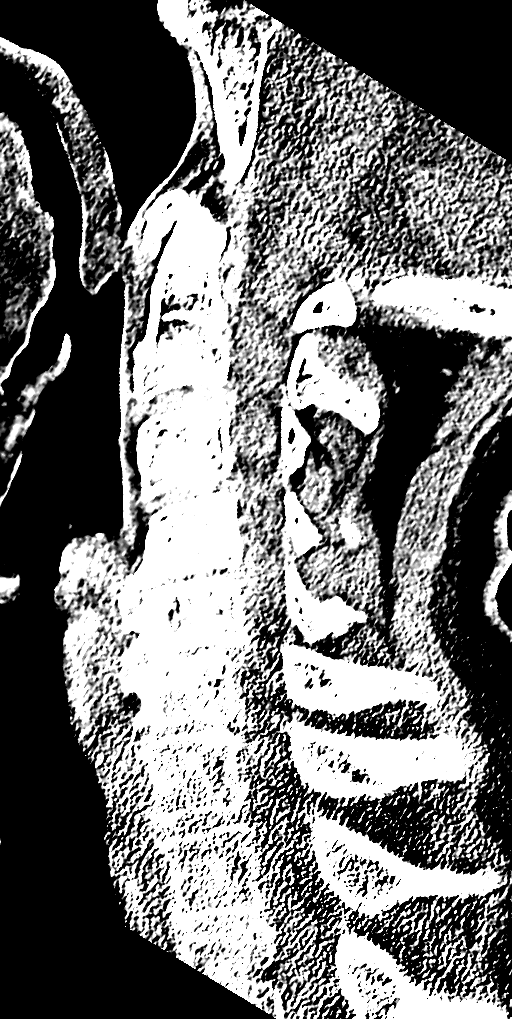
[im 40/60  brain]
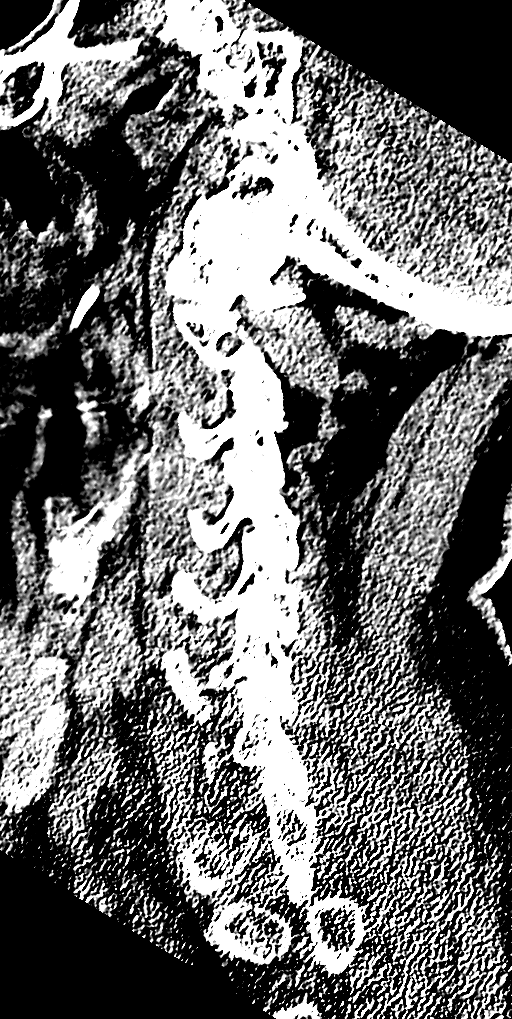

[Series 9: axial bone 2.0 · axial · 0.22mm/px · z∈[+64,+228]mm · 8 of 119 slices shown]
[im 10/119  bone]
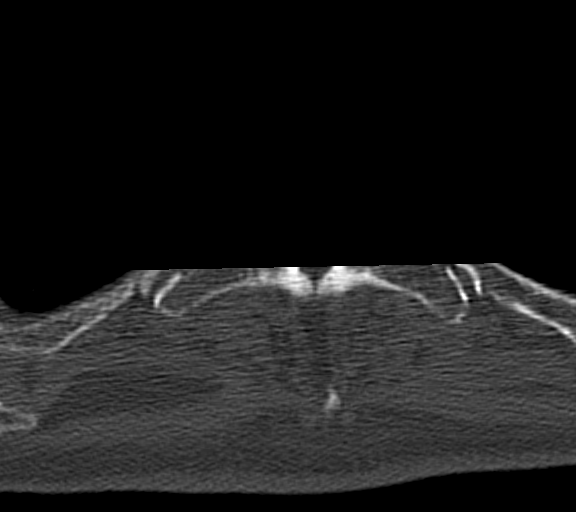
[im 28/119  bone]
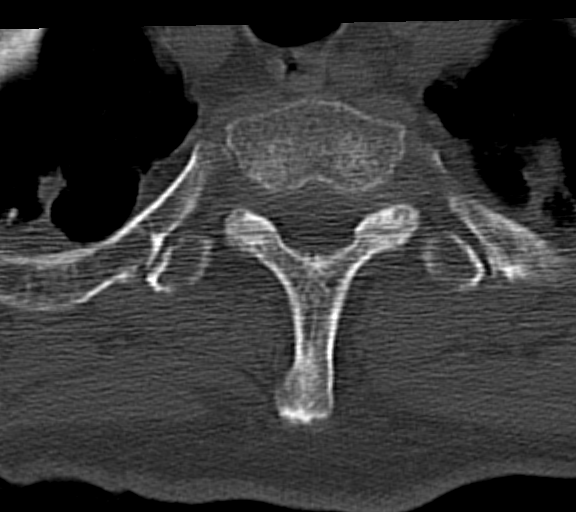
[im 37/119  bone]
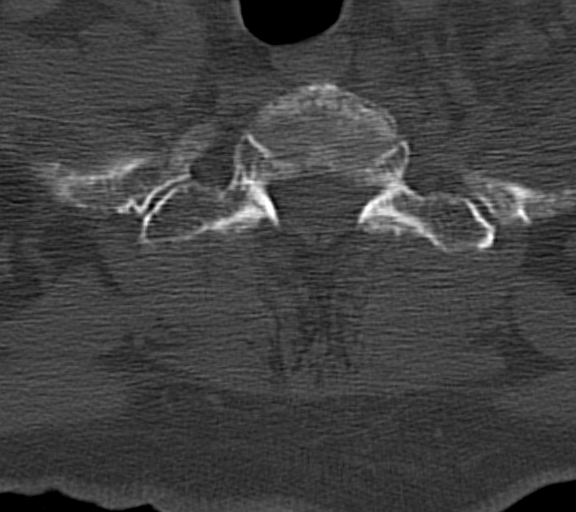
[im 55/119  bone]
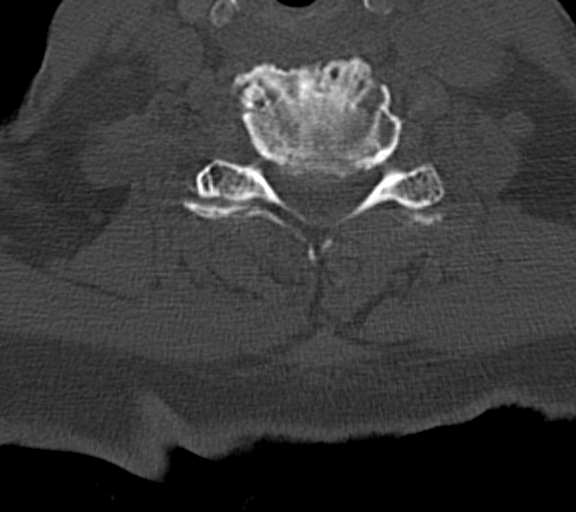
[im 64/119  bone]
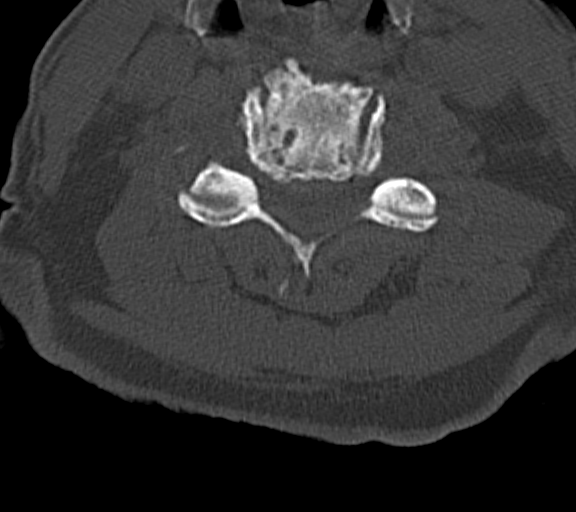
[im 82/119  bone]
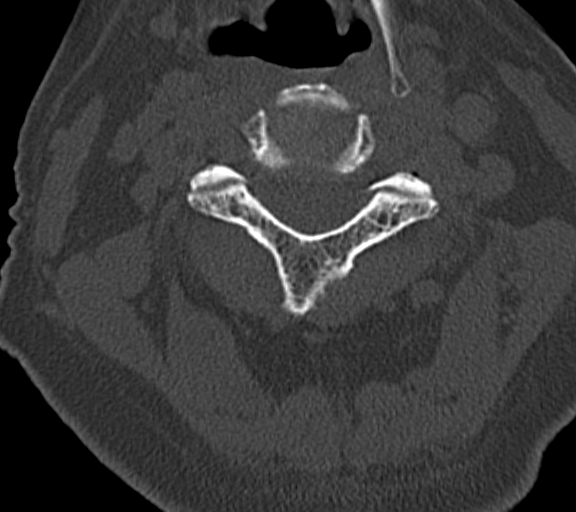
[im 91/119  bone]
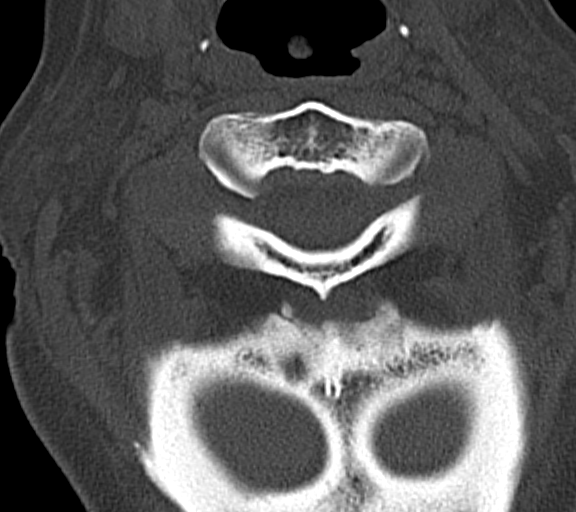
[im 109/119  bone]
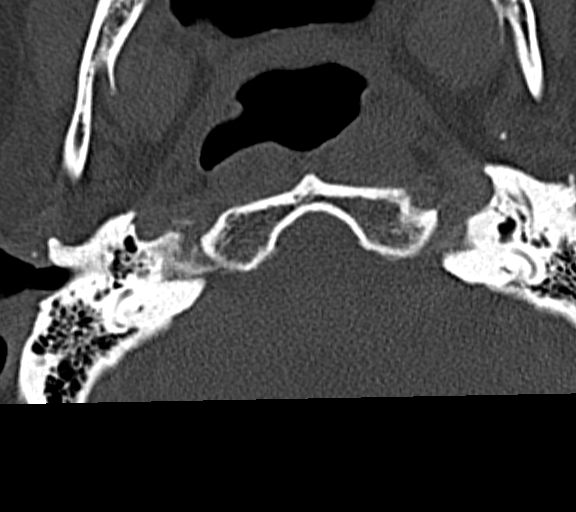

[14 of 37 positions shown; findings below may reference images not displayed]

FINDINGS: CT HEAD FINDINGS

Bony calvarium appears intact. Minimal bilateral maxillary sinusitis
is noted. Mild diffuse cortical atrophy is noted. No mass effect or
midline shift is noted. Ventricular size is within normal limits.
There is no evidence of mass lesion, hemorrhage or acute infarction.

CT CERVICAL SPINE FINDINGS

No fracture is noted. Minimal grade 1 retrolisthesis is noted at
C4-5 and C5-6 secondary to degenerative disc disease at these
levels. Anterior osteophyte formation is noted at these levels as
well. Severe degenerative disc disease is noted at C6-7. No
significant abnormality seen involving the visualized lung apices.
IMPRESSION: Minimal bilateral maxillary sinusitis. Mild diffuse cortical
atrophy. No acute intracranial abnormality seen.

Severe multilevel degenerative disc disease is noted. No acute
abnormality seen in the cervical spine.

## 2016-01-18 IMAGING — DX DG ABDOMEN ACUTE W/ 1V CHEST
3 series · 3 of 3 positions shown · non-contrast
Comparison: 11/07/2014

CLINICAL DATA: Loss of bowel control.  Mental status changes.

EXAM:
DG ABDOMEN ACUTE W/ 1V CHEST

[abdomen erect]
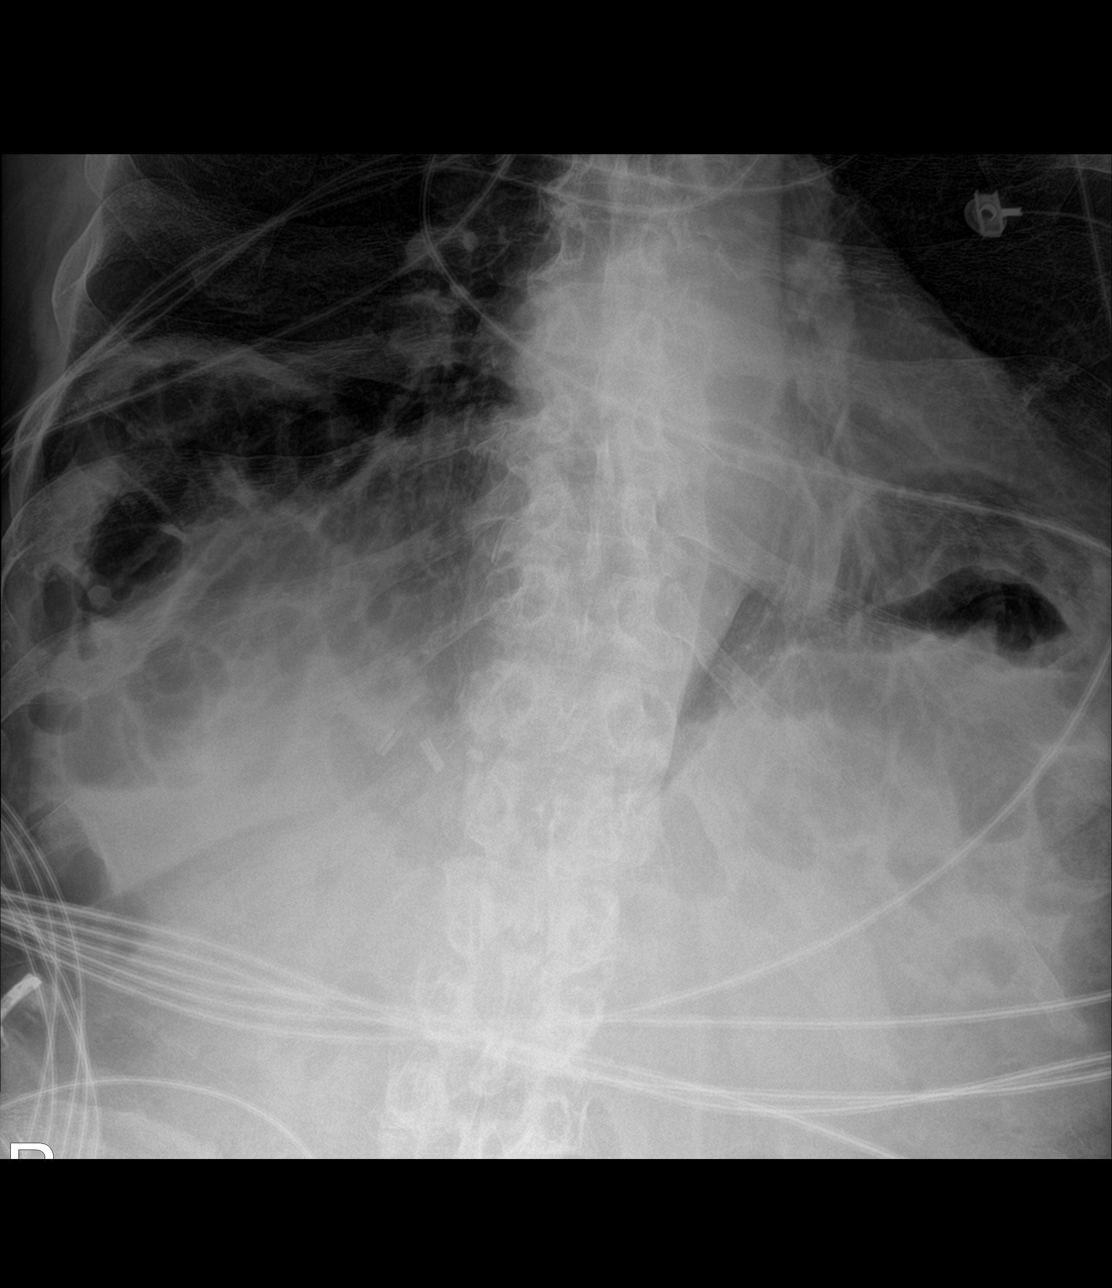

[abdomen supine]
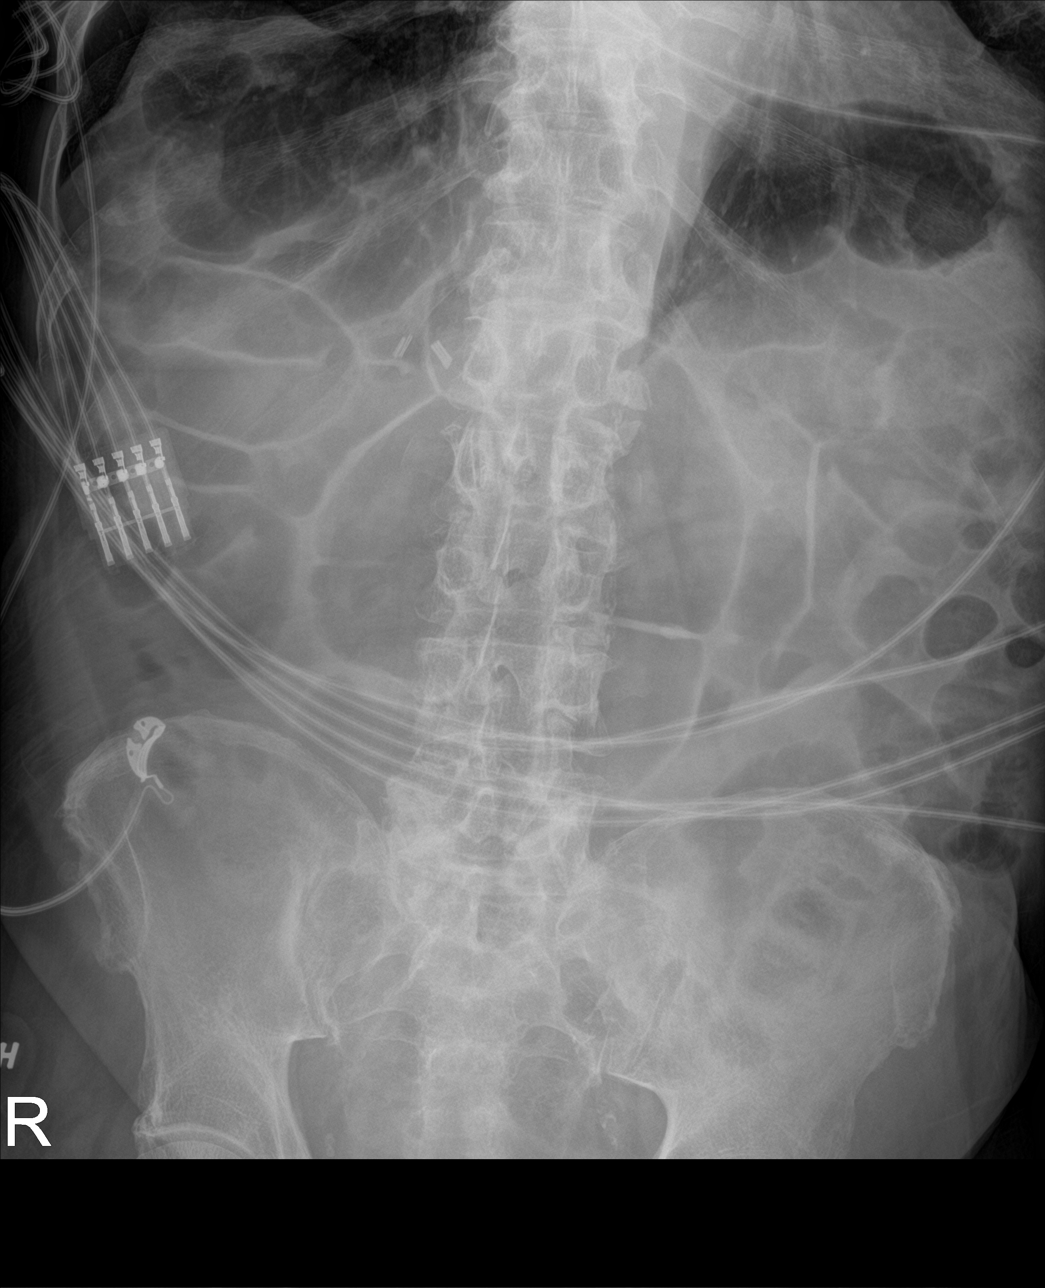

[chest pa]
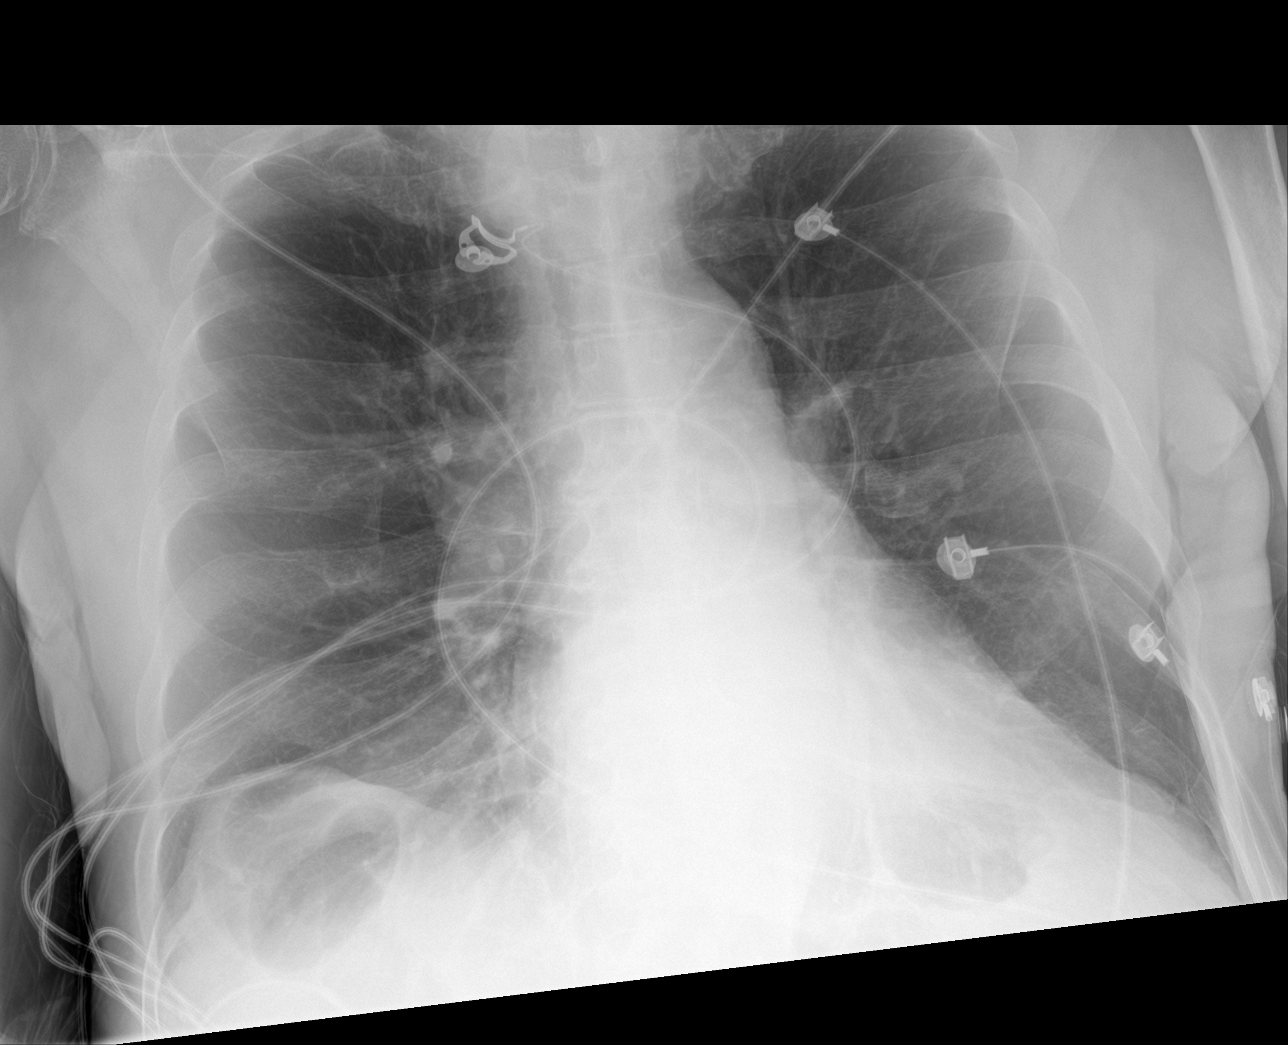

[3 of 3 positions shown; findings below may reference images not displayed]

FINDINGS: There is a moderate amount of gas within small and large bowel but
the pattern does not suggest ileus or obstruction. No sign of free
air. There is interposition of colon between the liver and the
diaphragm. Clips in the right upper quadrant consistent with
previous cholecystectomy. Vascular calcification.

One-view chest shows normal heart size. There is mild volume loss in
the left lower lobe. There are made of the chest is clear. No free
air seen.
IMPRESSION: No acute intra abdominal finding. Gas within small and large bowel
but no evidence of ileus, obstruction or free air. Probable mild
atelectasis in the left lower lobe.

## 2016-02-29 IMAGING — CR DG CHEST 1V PORT
1 series · 1 of 1 positions shown · non-contrast
Comparison: Chest x-rays dated 11/30/2014 and 11/07/2014

CLINICAL DATA: Fever.  Metastatic lung cancer to the brain.

EXAM:
PORTABLE CHEST 1 VIEW

[ap portable]
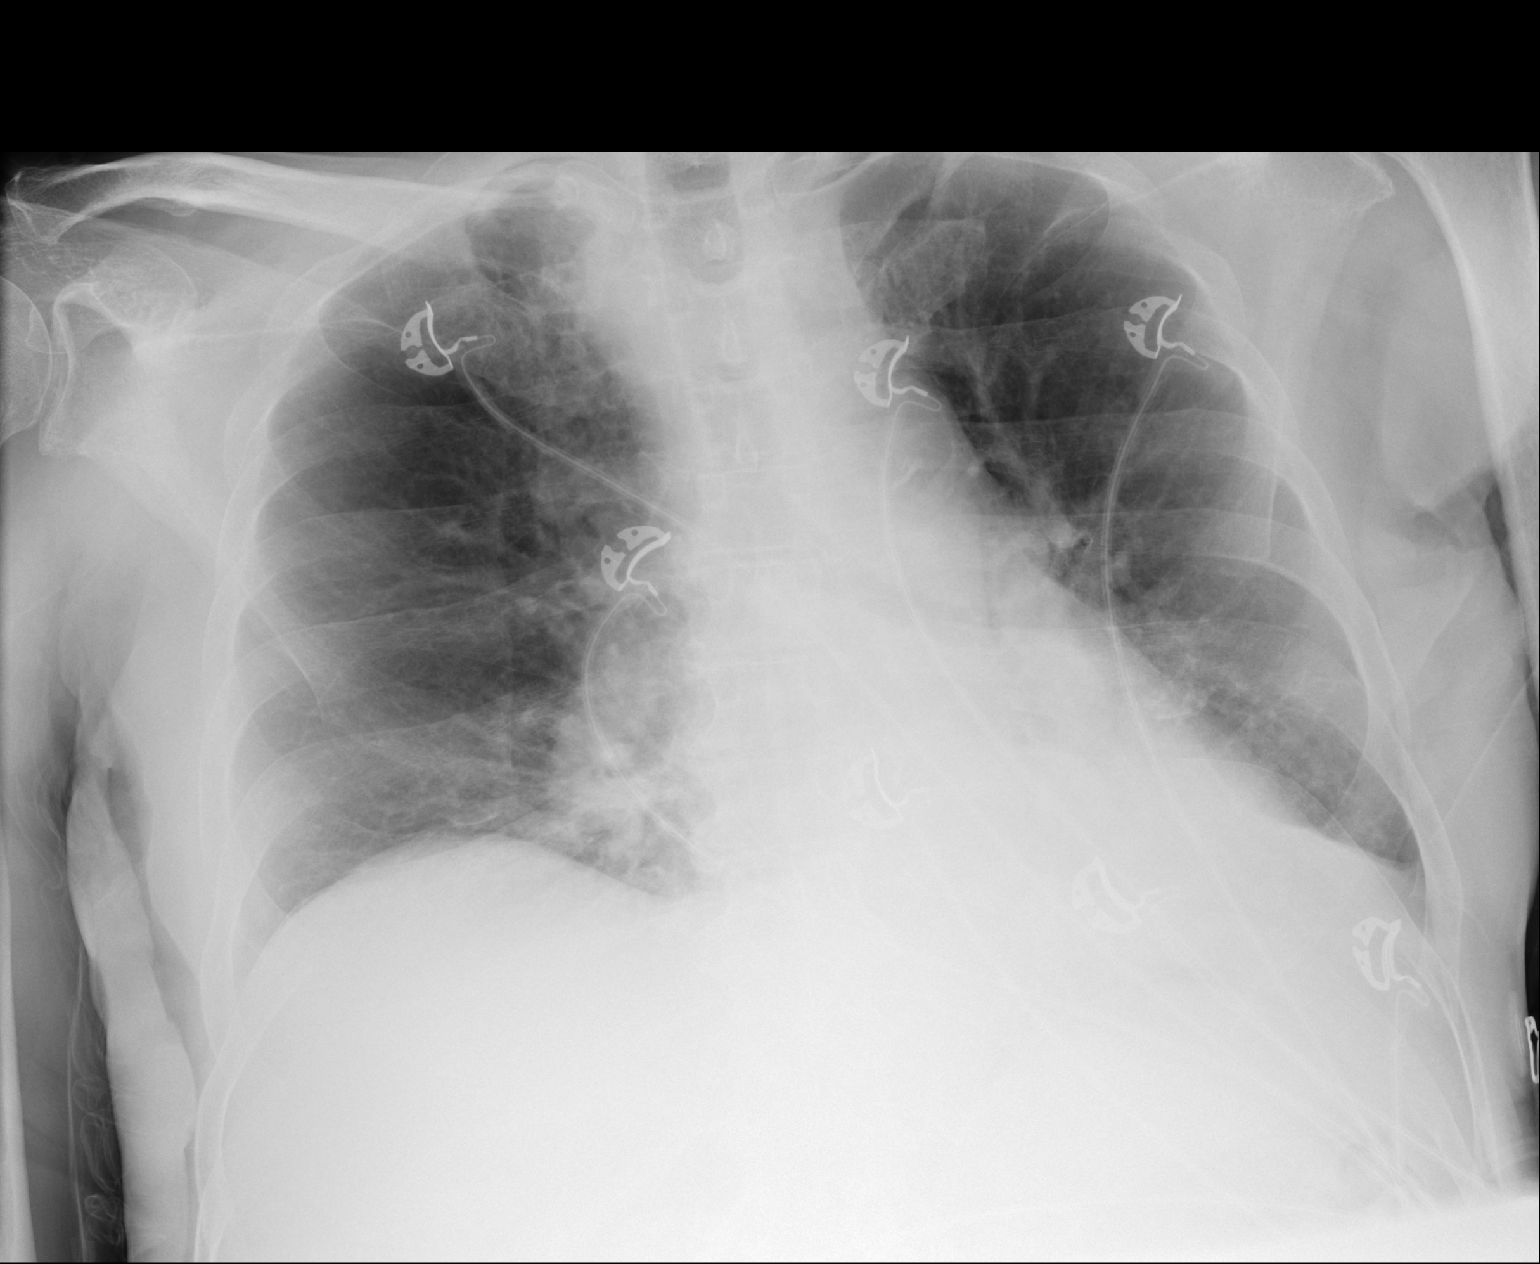

[1 of 1 positions shown; findings below may reference images not displayed]

FINDINGS: The heart size and pulmonary vascularity are normal. There is no
area of consolidation/ atelectasis at the left lung base which is
new with air bronchograms. The patient has taken a shallow
inspiration, creating slight atelectasis at the right base.

There is a right upper lobe mass medially as demonstrated on the
prior PET-CT of 10/21/2014. The patient also has right hilar
adenopathy demonstrated on the PET-CT.

Calcification in the thoracic aorta.  No acute osseous abnormality.
IMPRESSION: New left lower lobe atelectasis and consolidation posterior
medially. This could represent pneumonia.

## 2017-05-30 ENCOUNTER — Encounter: Payer: Self-pay | Admitting: Radiation Therapy
